# Patient Record
Sex: Male | Born: 1940 | Race: White | Hispanic: No | Smoking: Former smoker
Health system: Southern US, Community
[De-identification: ages and names within clinical notes are randomized; demographics above are authoritative.]

## PROBLEM LIST (undated history)

## (undated) DIAGNOSIS — F0390 Unspecified dementia without behavioral disturbance: Secondary | ICD-10-CM

## (undated) DIAGNOSIS — E119 Type 2 diabetes mellitus without complications: Secondary | ICD-10-CM

## (undated) DIAGNOSIS — I519 Heart disease, unspecified: Secondary | ICD-10-CM

## (undated) DIAGNOSIS — E785 Hyperlipidemia, unspecified: Secondary | ICD-10-CM

## (undated) DIAGNOSIS — I959 Hypotension, unspecified: Secondary | ICD-10-CM

## (undated) HISTORY — DX: Type 2 diabetes mellitus without complications: E11.9

## (undated) HISTORY — DX: Heart disease, unspecified: I51.9

## (undated) HISTORY — DX: Hypotension, unspecified: I95.9

## (undated) HISTORY — DX: Hyperlipidemia, unspecified: E78.5

## (undated) HISTORY — DX: Unspecified dementia, unspecified severity, without behavioral disturbance, psychotic disturbance, mood disturbance, and anxiety: F03.90

## (undated) HISTORY — PX: TONSILLECTOMY AND ADENOIDECTOMY: SHX28

---

## 1959-07-04 HISTORY — PX: APPENDECTOMY: SHX54

## 2008-07-31 LAB — HM COLONOSCOPY

## 2010-07-03 HISTORY — PX: TOTAL KNEE ARTHROPLASTY: SHX125

## 2014-07-03 HISTORY — PX: CORONARY ANGIOPLASTY WITH STENT PLACEMENT: SHX49

## 2016-03-28 ENCOUNTER — Encounter: Payer: Self-pay | Admitting: *Deleted

## 2016-04-05 ENCOUNTER — Non-Acute Institutional Stay: Payer: PPO | Admitting: Internal Medicine

## 2016-04-05 ENCOUNTER — Encounter: Payer: Self-pay | Admitting: Internal Medicine

## 2016-04-05 VITALS — BP 100/60 | HR 68 | Temp 98.7°F | Ht 64.5 in | Wt 163.0 lb

## 2016-04-05 DIAGNOSIS — M170 Bilateral primary osteoarthritis of knee: Secondary | ICD-10-CM | POA: Insufficient documentation

## 2016-04-05 DIAGNOSIS — F0391 Unspecified dementia with behavioral disturbance: Secondary | ICD-10-CM | POA: Diagnosis not present

## 2016-04-05 DIAGNOSIS — I2510 Atherosclerotic heart disease of native coronary artery without angina pectoris: Secondary | ICD-10-CM

## 2016-04-05 DIAGNOSIS — C4442 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 and neck: Secondary | ICD-10-CM | POA: Diagnosis not present

## 2016-04-05 DIAGNOSIS — N400 Enlarged prostate without lower urinary tract symptoms: Secondary | ICD-10-CM | POA: Diagnosis not present

## 2016-04-05 DIAGNOSIS — E785 Hyperlipidemia, unspecified: Secondary | ICD-10-CM

## 2016-04-05 DIAGNOSIS — N3281 Overactive bladder: Secondary | ICD-10-CM | POA: Diagnosis not present

## 2016-04-05 DIAGNOSIS — I119 Hypertensive heart disease without heart failure: Secondary | ICD-10-CM | POA: Diagnosis not present

## 2016-04-05 DIAGNOSIS — E1169 Type 2 diabetes mellitus with other specified complication: Secondary | ICD-10-CM | POA: Diagnosis not present

## 2016-04-05 DIAGNOSIS — I959 Hypotension, unspecified: Secondary | ICD-10-CM

## 2016-04-05 DIAGNOSIS — E119 Type 2 diabetes mellitus without complications: Secondary | ICD-10-CM

## 2016-04-05 DIAGNOSIS — H3322 Serous retinal detachment, left eye: Secondary | ICD-10-CM | POA: Diagnosis not present

## 2016-04-05 DIAGNOSIS — F03918 Unspecified dementia, unspecified severity, with other behavioral disturbance: Secondary | ICD-10-CM | POA: Insufficient documentation

## 2016-04-05 HISTORY — DX: Type 2 diabetes mellitus with other specified complication: E11.69

## 2016-04-05 HISTORY — DX: Hyperlipidemia, unspecified: E78.5

## 2016-04-05 HISTORY — DX: Benign prostatic hyperplasia without lower urinary tract symptoms: N40.0

## 2016-04-05 NOTE — Progress Notes (Signed)
PLEASE NOTE THAT PT'S NAME IS SPELLED WRONG--IT IS Gimbel NOT DROMETOR  Provider:  Da Authement L. Mariea Clonts, D.O., C.M.D. Location:  Occupational psychologist of Service:  Clinic (12)  Previous PCP: in [place] (records to be scanned)  Extended Emergency Contact Information Primary Emergency Contact: [employer] Address: [address]          [place], [place] [address] [name] of [place] Phone: [phone] Relation: Daughter  Code Status: DNR Goals of Care: Advanced Directive information Advanced Directives 04/05/2016  Does patient have an advance directive? Yes  Copy of advanced directive(s) in chart? No - copy requested    Chief Complaint  Patient presents with  . Establish Care    New Patient    HPI: Patient is a 75 y.o. male seen today to establish with [facility].  Records have been requested from his PCP.  No bloodwork recently.   Hyperlipidemia and DMII were well controlled at last eval.    Has had low bp--can only take 1/2 of a lisinopril.  CAD:  Had 2 stents placed in 2015.  Drug-eluting stents.  He had an episode of tightness in his chest and pain at that time.  He did very well.  Does still get a little tight once in a while, but has not needed the nitroglycerin.    In 2010, had loose, shattered retina.  Had a surgical repair.  Unclear which eye.  His wife says left.  Dr. Lowella Bandy was seeing him every 6 mos.    Had OA of right knee--has TKA in 2012.  Left not painful.    PSA tests have been normal.  He's had leakage of urine.  He takes flomax and myrbetriq.  Dr. Meredith Pel.  Skin problems on his head.  [place].  Had blue light treatment on his head and does wear a hat regularly now.    Vision is poor since the retina problem. Is HOH.    Dementia:  Wakes up and does not recall how he woke up.  He wandered around the house in the dark inside.  He has difficulty with the electronic locks--gets locked out.    On aricept and namenda.  No hallucinations and delusions.  Worries he will get lost.  Does have word-finding difficulties.  Freda Munro helps him with his medications--she does the pillbox weekly.  One box for am and one for pm and she helps him select the correct box.  His wife guides him through the shower.  Coming here was difficult.  After 3-4 weeks, they have a routine down.  She's there for all of his routine.  She selects his clothes and underwear. Their daughter is here and she is helping.  His wife would like to do some things like play bridge.  She has not hired someone just yet.    He has only fallen once.  Fell backwards b/c he had nothing in front of him.  He broke the porcelain tank of the toilet (missed toilet as sitting) and fell between toilet and shower.    Does have urinary incontinence--will just come w/o any control.  Discussed scheduled toileting.    Does have tremor of his hands.  Has difficulty getting into the bed and under the covers.  Takes 15-20 mins to get comfortable.  Not having pain.    Says he has some depression related to lack of ability to move like he wants  to.  Has difficulty getting his body to do what he wants it to do.  He has to think about it to put on his pants.  Has to concentrate very hard to get in his pants.    He needs a urology appt also.    He needs hobbies.  Can't concentrate on anything now.  Was workaholic and family, nothing else.  Past Medical History:  Diagnosis Date  . Dementia   . Diabetes ([place])   . Hyperlipidemia   . Hypotension    Past Surgical History:  Procedure Laterality Date  . APPENDECTOMY  1961  . TONSILLECTOMY AND ADENOIDECTOMY    . TOTAL KNEE ARTHROPLASTY  2012    Social History   Social History  . Marital status: Married    Spouse name: N/A  . Number of children: N/A  . Years of education: N/A   Social History Main Topics  . Smoking status: Former Smoker    Packs/day: 0.50    Years: 20.00    Types: Cigarettes    . Smokeless tobacco: Never Used  . Alcohol use No  . Drug use: No  . Sexual activity: No   Other Topics Concern  . None   Social History Narrative   Diet?       Do you drink/eat things with caffeine? no      Marital status?           married                         What year were you married?  1966      Do you live in a house, apartment, assisted living, condo, trailer, etc.? house      Is it one or more stories? one      How many persons live in your home?      Do you have any pets in your home? (please list) none      Current or past profession: financial      Do you exercise?   yes                                   Type & how often? 1/day      Do you have a living will? yes      Do you have a DNR form?                                  If not, do you want to discuss one?      Do you have signed POA/HPOA for forms?        reports that he has quit smoking. His smoking use included Cigarettes. He has a 10.00 pack-year smoking history. He has never used smokeless tobacco. He reports that he does not drink alcohol or use drugs.  Functional Status Survey:  SEE HPI  Family History  Problem Relation Age of Onset  . Cancer Mother 63  . Heart attack Father 54  . Diabetes Sister   . Brain cancer Sister     Health Maintenance  Topic Date Due  . HEMOGLOBIN A1C  01[birth year]02-40  . FOOT EXAM  [birth year]/1950  . OPHTHALMOLOGY EXAM  [birth year]/1950  . COLONOSCOPY  07/31/2013  . PNA vac Low Risk Adult (2 of 2 - PCV13) 02/28/2014  . INFLUENZA  VACCINE  02/01/2016  . TETANUS/TDAP  03/22/2016  . ZOSTAVAX  Completed    Allergies  Allergen Reactions  . Lortab [Hydrocodone-Acetaminophen]   . Morphine And Related       Medication List       Accurate as of 04/05/16 11:59 PM. Always use your most recent med list.          acetaminophen 650 MG CR tablet Commonly known as:  TYLENOL Take 650 mg by mouth daily.   allopurinol 100 MG tablet Commonly known as:  ZYLOPRIM Take 100 mg  by mouth daily.   aspirin EC 81 MG tablet Take 81 mg by mouth daily.   atorvastatin 80 MG tablet Commonly known as:  LIPITOR Take 80 mg by mouth daily.   clopidogrel 75 MG tablet Commonly known as:  PLAVIX Take 75 mg by mouth daily.   donepezil 10 MG tablet Commonly known as:  ARICEPT Take 10 mg by mouth at bedtime.   lisinopril 5 MG tablet Commonly known as:  PRINIVIL,ZESTRIL Take 5 mg by mouth daily.   memantine 10 MG tablet Commonly known as:  NAMENDA Take 10 mg by mouth 2 (two) times daily.   metFORMIN 500 MG tablet Commonly known as:  GLUCOPHAGE Take by mouth 2 (two) times daily with a meal.   MYRBETRIQ 50 MG Tb24 tablet Generic drug:  mirabegron ER Take 50 mg by mouth daily.   nitroGLYCERIN 0.4 MG SL tablet Commonly known as:  NITROSTAT Place 0.4 mg under the tongue every 5 (five) minutes as needed for chest pain.   tamsulosin 0.4 MG Caps capsule Commonly known as:  FLOMAX Take 0.4 mg by mouth daily.   vitamin B-12 1000 MCG tablet Commonly known as:  CYANOCOBALAMIN Take 1,000 mcg by mouth daily.       Review of Systems  Constitutional: Negative for chills, fever and malaise/fatigue.  HENT: Positive for hearing loss. Negative for congestion.   Eyes: Negative for blurred vision.       Glasses; left partial retinal detachment it sounds like  Respiratory: Negative for cough and shortness of breath.   Cardiovascular: Negative for chest pain, palpitations and leg swelling.  Gastrointestinal: Negative for abdominal pain, blood in stool, constipation, diarrhea, heartburn, melena, nausea and vomiting.  Genitourinary: Positive for frequency and urgency. Negative for dysuria.       Urinary incontinence  Musculoskeletal: Positive for falls.  Skin: Negative for itching and rash.  Neurological: Positive for tremors. Negative for dizziness, tingling, sensory change, loss of consciousness and weakness.  Psychiatric/Behavioral: Positive for memory loss. Negative for  depression. The patient is nervous/anxious. The patient does not have insomnia.        Fidgety, cannot sit still, wants to get up and walk around    Vitals:   04/05/16 1017  BP: 100/60  Pulse: 68  Temp: 98.7 F (37.1 C)  TempSrc: Oral  SpO2: 98%  Weight: 163 lb (73.9 kg)  Height: 5' 4.5" (1.638 m)   Body mass index is 27.55 kg/m. Physical Exam  Constitutional: He appears well-developed and well-nourished. No distress.  HENT:  Head: Normocephalic.  Right Ear: External ear normal.  Left Ear: External ear normal.  Nose: Nose normal.  Mouth/Throat: Oropharynx is clear and moist. No oropharyngeal exudate.  Eyes: Conjunctivae and EOM are normal. Pupils are equal, round, and reactive to light.  glasses  Neck: Normal range of motion. Neck supple.  Cardiovascular: Normal rate, regular rhythm, normal heart sounds and intact distal pulses.   Pulmonary/Chest:  Effort normal and breath sounds normal. No respiratory distress.  Abdominal: Soft. Bowel sounds are normal.  Musculoskeletal: Normal range of motion. He exhibits no edema or tenderness.  Neurological: He is alert. No cranial nerve deficit.  Oriented to person and place, not time, knows he is at [place], but keeps forgetting where at [place] during appt  Skin: Skin is warm and dry. Capillary refill takes less than 2 seconds.  Psychiatric:  Somewhat flat affect, does attempt to answer questions, but often does not remember and looks to his wife, Freda Munro, for help    Labs reviewed: Latest labs to be scanned with records from [place] and Procedures noted on new patient packet: 2010 cscope 2015 cath with 2 DES placed   Assessment/Plan 1. Dementia with behavioral disturbance, unspecified dementia type - biggest active problem now due to major adjustment to new home - his wife is working to get some assistance in the home with getting him ready in the mornings and possibly just to stay with him so she can do some  errands - unclear how much his incontinence is functional vs. bph and oab related - cont namenda and aricept--does not have difficulty taking his pills, but his wife helps him  2. Diabetes mellitus type 2 without retinopathy ([place]) - has been well controlled, continue asa 81, statin, ace and monitor especially as he gets adjusted here (pts tend to gain weight when they move to [facility])  3. Hyperlipidemia associated with type 2 diabetes mellitus ([place]) -cont statin therapy and monitor FLP  4. BPH without obstruction/lower urinary tract symptoms - cont flomax - still with severe urinary frequency and incontinence, needs urology f/u - Ambulatory referral to Urology  5. Bilateral primary osteoarthritis of knee -s/p single TKA, other leg less bothersome, has prn tylenol if needed  6. Hypotension, unspecified hypotension type - low bp has historically only tolerated the low dose ace - Ambulatory referral to Cardiology  7. Coronary artery disease involving native coronary artery of native heart without angina pectoris -s/p 2 DES -I don't have full details on this yet -need more records -pt on asa and plavix, asymptomatic recently - Ambulatory referral to Cardiology  8. Partial retinal detachment of left eye -poor vision in left eye -will need ophtho f/u also -continues to wear glasses, as well  9. Benign hypertensive heart disease without heart failure -bp runs low so only on ace low dose (also has flomax which can affect bp)  10. OAB (overactive bladder) -cont myrbetriq -needs urology f/u  11. 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 Lakeva Hollon, D.O. [facility] [address], [place] [address] Cell Phone (Mon-Fri 8am-5pm):  [phone] On Call:  [phone] & follow prompts after 5pm & weekends Office Phone:  [phone] Office Fax:  [phone]

## 2016-04-10 DIAGNOSIS — I25118 Atherosclerotic heart disease of native coronary artery with other forms of angina pectoris: Secondary | ICD-10-CM | POA: Insufficient documentation

## 2016-04-10 DIAGNOSIS — C4442 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 and neck: Secondary | ICD-10-CM

## 2016-04-10 DIAGNOSIS — H3322 Serous retinal detachment, left eye: Secondary | ICD-10-CM

## 2016-04-10 DIAGNOSIS — I119 Hypertensive heart disease without heart failure: Secondary | ICD-10-CM | POA: Insufficient documentation

## 2016-04-10 DIAGNOSIS — N3281 Overactive bladder: Secondary | ICD-10-CM | POA: Insufficient documentation

## 2016-04-10 HISTORY — DX: Atherosclerotic heart disease of native coronary artery with other forms of angina pectoris: I25.118

## 2016-04-10 HISTORY — DX: Hypertensive heart disease without heart failure: I11.9

## 2016-04-10 HISTORY — DX: 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 and neck: C44.42

## 2016-04-10 HISTORY — DX: Serous retinal detachment, left eye: H33.22

## 2016-04-28 ENCOUNTER — Other Ambulatory Visit: Payer: Self-pay | Admitting: Internal Medicine

## 2016-05-03 ENCOUNTER — Encounter: Payer: Self-pay | Admitting: Internal Medicine

## 2016-05-04 ENCOUNTER — Encounter: Payer: Self-pay | Admitting: Internal Medicine

## 2016-05-24 ENCOUNTER — Encounter (INDEPENDENT_AMBULATORY_CARE_PROVIDER_SITE_OTHER): Payer: Self-pay

## 2016-05-24 ENCOUNTER — Encounter: Payer: Self-pay | Admitting: Cardiovascular Disease

## 2016-05-24 ENCOUNTER — Ambulatory Visit (INDEPENDENT_AMBULATORY_CARE_PROVIDER_SITE_OTHER): Payer: PPO | Admitting: Cardiovascular Disease

## 2016-05-24 VITALS — BP 100/60 | HR 64 | Ht 65.0 in | Wt 160.4 lb

## 2016-05-24 DIAGNOSIS — Z7689 Persons encountering health services in other specified circumstances: Secondary | ICD-10-CM

## 2016-05-24 NOTE — Patient Instructions (Signed)
Medication Instructions:  Your physician recommends that you continue on your current medications as directed. Please refer to the Current Medication list given to you today.  Labwork: None  Testing/Procedures: None  Follow-Up: Your physician wants you to follow-up in: 1 year with Dr. Nishan.  You will receive a reminder letter in the mail two months in advance. If you don't receive a letter, please call our office to schedule the follow-up appointment.   Any Other Special Instructions Will Be Listed Below (If Applicable).     If you need a refill on your cardiac medications before your next appointment, please call your pharmacy.   

## 2016-05-24 NOTE — Progress Notes (Addendum)
Cardiology Office Note   Date:  05/24/2016   ID:  Jose Castillo, DOB 07/11/1940, MRN [id number]  PCP:  Hollace Kinnier, DO  Cardiologist:   Jenkins Rouge, MD   Chief Complaint  Patient presents with  . Establish Care      History of Present Illness: Jose Castillo is a 75 y.o. male who presents for evaluation of CAD. Notes from [facility] indicate that patient has had 2 stents in 2015 Occasional chest Pain not requiring nitro. He has dementia and is a DNR.  Falls and has urinary incontinence On Rx for HTN , cholesterol and DM.    He has advanced dementia and is not a candidate for aggressive cardiac testing Namenda added recently and no help. Move to [place] has thrown him off his Routine and really confused him  Wife of 44 years indicates he has not had any chest pain   Stents done at [facility] in [place] at that time he had chest pain and dyspnea Dementia was not bad then   Addendum:  06/12/16 records from [place] reviewed  Cath Mid LAD DES 2.5 x 18 mm Resolute post dilated 3.0 mm noncompliant balloon DES to OM3 2.25 x 18 mm Resolute stent  Presented with angina no MI   Past Medical History:  Diagnosis Date  . Dementia   . Diabetes ([place])   . Hyperlipidemia   . Hypotension     Past Surgical History:  Procedure Laterality Date  . APPENDECTOMY  1961  . TONSILLECTOMY AND ADENOIDECTOMY    . TOTAL KNEE ARTHROPLASTY  2012     Current Outpatient Prescriptions  Medication Sig Dispense Refill  . acetaminophen (TYLENOL) 650 MG CR tablet Take 650 mg by mouth daily.    Marland Kitchen allopurinol (ZYLOPRIM) 100 MG tablet Take 100 mg by mouth daily.    Marland Kitchen aspirin EC 81 MG tablet Take 81 mg by mouth daily.    Marland Kitchen atorvastatin (LIPITOR) 80 MG tablet Take 80 mg by mouth daily.    . clopidogrel (PLAVIX) 75 MG tablet Take 75 mg by mouth daily.    Marland Kitchen donepezil (ARICEPT) 10 MG tablet Take 10 mg by mouth at bedtime.    Marland Kitchen lisinopril (PRINIVIL,ZESTRIL) 5 MG tablet Take 5  mg by mouth daily.    . memantine (NAMENDA) 10 MG tablet Take 10 mg by mouth 2 (two) times daily.    . metFORMIN (GLUCOPHAGE) 500 MG tablet Take by mouth 2 (two) times daily with a meal.    . MYRBETRIQ 50 MG TB24 tablet TAKE 1 TABLET BY MOUTH EVERY DAY. MAXIMUM DAILY DOSE IS 1 30 tablet 0  . nitroGLYCERIN (NITROSTAT) 0.4 MG SL tablet Place 0.4 mg under the tongue every 5 (five) minutes as needed for chest pain.    . tamsulosin (FLOMAX) 0.4 MG CAPS capsule Take 0.4 mg by mouth daily.    . vitamin B-12 (CYANOCOBALAMIN) 1000 MCG tablet Take 1,000 mcg by mouth daily.     No current facility-administered medications for this visit.     Allergies:   Lortab [hydrocodone-acetaminophen] and Morphine and related    Social History:  The patient  reports that he has quit smoking. His smoking use included Cigarettes. He has a 10.00 pack-year smoking history. He has never used smokeless tobacco. He reports that he does not drink alcohol or use drugs.   Family History:  The patient's family history includes Brain cancer in his sister; Cancer (age of onset: 71) in his mother; Diabetes  in his sister; Heart attack (age of onset: 35) in his father.    ROS:  Please see the history of present illness.   Otherwise, review of systems are positive for none.   All other systems are reviewed and negative.    PHYSICAL EXAM: VS:  BP 100/60   Pulse 64   Ht 5\' 5"  (1.651 m)   Wt 72.8 kg (160 lb 6.4 oz)   SpO2 90%   BMI 26.69 kg/m  , BMI Body mass index is 26.69 kg/m. Affect appropriate Healthy:  appears stated age 48: normal Neck supple with no adenopathy JVP normal no bruits no thyromegaly Lungs clear with no wheezing and good diaphragmatic motion Heart:  S1/S2 no murmur, no rub, gallop or click PMI normal Abdomen: benighn, BS positve, no tenderness, no AAA no bruit.  No HSM or HJR Distal pulses intact with no bruits No edema Neuro non-focal Skin warm and dry No muscular weakness    EKG:  NSR  nonspecific ST changes low voltage  05/24/16     Recent Labs: No results found for requested labs within last 8760 hours.    Lipid Panel No results found for: CHOL, TRIG, HDL, CHOLHDL, VLDL, LDLCALC, LDLDIRECT    Wt Readings from Last 3 Encounters:  05/24/16 72.8 kg (160 lb 6.4 oz)  04/05/16 73.9 kg (163 lb)      Other studies Reviewed: Additional studies/ records that were reviewed today include: Notes [employer].    ASSESSMENT AND PLAN:  1.  CAD: stable no again continue current meds including ASA/Plavix Wife has nitro  At [facility] and has not needed to use it in 2 years will get records from [facility] 2. Demential advanced on aricept and namenda.  3. DM:  Discussed low carb diet.  Target hemoglobin A1c is 6.5 or less.  Continue current medications. 4. Chol  On statin continue current meds    Current medicines are reviewed at length with the patient today.  The patient does not have concerns regarding medicines.  The following changes have been made:  no change  Labs/ tests ordered today include: None   Orders Placed This Encounter  Procedures  . EKG 12-Lead     Disposition:   FU with in a year      Signed, Jenkins Rouge, MD  05/24/2016 3:30 PM    [address], [place], [place]  [address] Phone: [phone]; Fax: [phone]

## 2016-06-01 ENCOUNTER — Telehealth: Payer: Self-pay

## 2016-06-01 NOTE — Telephone Encounter (Signed)
PLACED NOTES IN DR NISHAN BOX.

## 2016-06-06 ENCOUNTER — Other Ambulatory Visit: Payer: Self-pay | Admitting: Internal Medicine

## 2016-06-16 ENCOUNTER — Other Ambulatory Visit: Payer: Self-pay

## 2016-06-16 MED ORDER — MIRABEGRON ER 50 MG PO TB24: ORAL_TABLET | ORAL | 2 refills | Status: DC

## 2016-06-16 MED ORDER — MEMANTINE HCL 10 MG PO TABS: 10.0000 mg | ORAL_TABLET | Freq: Two times a day (BID) | ORAL | 2 refills | Status: DC

## 2016-06-22 ENCOUNTER — Telehealth: Payer: Self-pay | Admitting: *Deleted

## 2016-06-22 NOTE — Telephone Encounter (Signed)
Received prior authorization for Myrbetriz from [employer] Boykin Reaper [id number] and spoke with [facility] and medication was APPROVED through 12/31/20017. Ref#: [id number].  [id number]

## 2016-07-05 ENCOUNTER — Non-Acute Institutional Stay: Payer: PPO | Admitting: Internal Medicine

## 2016-07-05 ENCOUNTER — Encounter: Payer: Self-pay | Admitting: Internal Medicine

## 2016-07-05 VITALS — BP 110/60 | HR 66 | Temp 98.7°F | Wt 160.0 lb

## 2016-07-05 DIAGNOSIS — F0391 Unspecified dementia with behavioral disturbance: Secondary | ICD-10-CM | POA: Diagnosis not present

## 2016-07-05 DIAGNOSIS — E1169 Type 2 diabetes mellitus with other specified complication: Secondary | ICD-10-CM

## 2016-07-05 DIAGNOSIS — E1142 Type 2 diabetes mellitus with diabetic polyneuropathy: Secondary | ICD-10-CM

## 2016-07-05 DIAGNOSIS — I25118 Atherosclerotic heart disease of native coronary artery with other forms of angina pectoris: Secondary | ICD-10-CM

## 2016-07-05 DIAGNOSIS — N400 Enlarged prostate without lower urinary tract symptoms: Secondary | ICD-10-CM | POA: Diagnosis not present

## 2016-07-05 DIAGNOSIS — N3281 Overactive bladder: Secondary | ICD-10-CM | POA: Diagnosis not present

## 2016-07-05 DIAGNOSIS — E785 Hyperlipidemia, unspecified: Secondary | ICD-10-CM

## 2016-07-05 NOTE — Progress Notes (Signed)
Location:  Occupational psychologist of Service:  Clinic (12)  Provider: Bethaney Oshana L. Mariea Clonts, D.O., C.M.D.  Code Status: DNR Goals of Care:  Advanced Directives 04/05/2016  Does Patient Have a Medical Advance Directive? Yes  Copy of [place] in Chart? No - copy requested     Chief Complaint  Patient presents with  . Acute Visit    fill out form    HPI: Patient is a 76 y.o. male seen today for completion of a form for him to attend the adult day health center.  He has dementia, moderate.  He requires his wife's help with all adls except to feed himself. He was meant to be seen this afternoon for his annual visit, but his wife mixed up their appts so he came this am.  She will be rescheduled and his wellness will be rescheduled.  He had a team where he went to a treatment center in [place].  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  They are interested in a new neurologist's second opinion on that.                                                                                                                                                                                                                                  Past Medical History:  Diagnosis Date  . Dementia   . Diabetes ([place])   . Hyperlipidemia   . Hypotension     Past Surgical History:  Procedure Laterality Date  . APPENDECTOMY  1961  . TONSILLECTOMY AND ADENOIDECTOMY    . TOTAL KNEE ARTHROPLASTY  2012    Allergies  Allergen Reactions  . Lortab [Hydrocodone-Acetaminophen]   . Morphine And Related     Allergies as of 07/05/2016      Reactions   Lortab [hydrocodone-acetaminophen]    Morphine And Related       Medication List       Accurate as of 07/05/16 12:03 PM. Always use your most recent med list.          acetaminophen 650 MG CR tablet Commonly known as:   TYLENOL Take 650 mg by mouth daily.   allopurinol 100 MG tablet Commonly known as:  ZYLOPRIM Take 100 mg by mouth daily.   aspirin EC 81 MG tablet Take 81 mg  by mouth daily.   atorvastatin 80 MG tablet Commonly known as:  LIPITOR Take 80 mg by mouth daily.   clopidogrel 75 MG tablet Commonly known as:  PLAVIX Take 75 mg by mouth daily.   donepezil 10 MG tablet Commonly known as:  ARICEPT Take 10 mg by mouth at bedtime.   lisinopril 5 MG tablet Commonly known as:  PRINIVIL,ZESTRIL Take 5 mg by mouth daily.   memantine 10 MG tablet Commonly known as:  NAMENDA Take 1 tablet (10 mg total) by mouth 2 (two) times daily.   metFORMIN 500 MG tablet Commonly known as:  GLUCOPHAGE Take by mouth 2 (two) times daily with a meal.   mirabegron ER 50 MG Tb24 tablet Commonly known as:  MYRBETRIQ TAKE 1 TABLET BY MOUTH EVERY DAY. MAXIMUM DAILY DOSE IS 1 TABLET   nitroGLYCERIN 0.4 MG SL tablet Commonly known as:  NITROSTAT Place 0.4 mg under the tongue every 5 (five) minutes as needed for chest pain.   tamsulosin 0.4 MG Caps capsule Commonly known as:  FLOMAX Take 0.4 mg by mouth daily.   vitamin B-12 1000 MCG tablet Commonly known as:  CYANOCOBALAMIN Take 1,000 mcg by mouth daily.       Review of Systems:  Review of Systems  Constitutional: Negative for chills and fever.  Eyes:       Glasses  Respiratory: Negative for shortness of breath.   Cardiovascular: Negative for chest pain and palpitations.  Gastrointestinal: Negative for abdominal pain and constipation.  Genitourinary: Positive for urgency. Negative for dysuria and frequency.  Musculoskeletal: Negative for falls.  Neurological: Positive for tingling and sensory change. Negative for weakness.       In feet  Endo/Heme/Allergies:       DMII  Psychiatric/Behavioral: Positive for memory loss. Negative for depression.    Health Maintenance  Topic Date Due  . HEMOGLOBIN A1C  03/24/1941  . FOOT EXAM  01/15/1950    . OPHTHALMOLOGY EXAM  01/15/1951  . COLONOSCOPY  07/31/2013  . PNA vac Low Risk Adult (2 of 2 - PCV13) 02/28/2014  . INFLUENZA VACCINE  02/01/2016  . TETANUS/TDAP  03/22/2016  . ZOSTAVAX  Completed    Physical Exam: Vitals:   07/05/16 1202  BP: 110/60  Pulse: 66  Temp: 98.7 F (37.1 C)  TempSrc: Oral  SpO2: 95%  Weight: 160 lb (72.6 kg)   Body mass index is 26.63 kg/m. Physical Exam  Constitutional: He appears well-developed and well-nourished. No distress.  Cardiovascular: Normal rate and regular rhythm.   Pulmonary/Chest: Effort normal.  Musculoskeletal: Normal range of motion.  Neurological: He is alert.  Oriented to person, place, not time  Skin: Skin is warm and dry.    Labs reviewed: Labs were abstracted from 10/06/14  Assessment/Plan 1. 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 when diagnosed in [place] -does not seem to have typical behavioral symptoms -is now starting to have hallucinations, it sounds like, and he is aware these are not real--says he will think that he is doing one thing, then suddenly come to and realize he's somewhere else and no one else supports that what he thought happened actually did -his daughter says this is new as far as she knows (his wife was not at appt today due to her own medical appt) -agree with [facility] referral, form completed today -his wife may still need some additional help with pt's morning routine eventually, but his daughter reports that pt is actually  doing better now and gotten into a routine  -MRI brain CD showed atrophy, but no report was included  2. Coronary artery disease of native artery of native heart with stable angina pectoris (HCC) -stable, asymptomatic with current regimen  3. Hyperlipidemia associated with type 2 diabetes mellitus ([place]) -f/u FLP as last labs in 2016, is on lipitor therapy  4. OAB (overactive bladder) -using 50mg   myrbetriq and flomax  5. BPH without obstruction/lower urinary tract symptoms -cont flomax  6. Diabetic peripheral neuropathy associated with type 2 diabetes mellitus ([place]) -seems neuropathy is also new -he seems to be mixed up gout and neuropathy thinking his allopurinol is to prevent the neuropathic pain he has in bilateral feet  Labs/tests ordered: cbc, cmp, flp, hba1c next week Next appt:  07/27/2016 for annual wellness--needs MMSE  Bernice Mullin L. Abshir Paolini, D.O. [facility] [address], [place] [address] Cell Phone (Mon-Fri 8am-5pm):  [phone] On Call:  [phone] & follow prompts after 5pm & weekends Office Phone:  [phone] Office Fax:  [phone]

## 2016-07-07 DIAGNOSIS — R351 Nocturia: Secondary | ICD-10-CM | POA: Diagnosis not present

## 2016-07-07 DIAGNOSIS — N3941 Urge incontinence: Secondary | ICD-10-CM | POA: Diagnosis not present

## 2016-07-07 DIAGNOSIS — N401 Enlarged prostate with lower urinary tract symptoms: Secondary | ICD-10-CM | POA: Diagnosis not present

## 2016-07-11 ENCOUNTER — Encounter: Payer: Self-pay | Admitting: Internal Medicine

## 2016-07-11 DIAGNOSIS — F0391 Unspecified dementia with behavioral disturbance: Secondary | ICD-10-CM | POA: Diagnosis not present

## 2016-07-11 DIAGNOSIS — I25118 Atherosclerotic heart disease of native coronary artery with other forms of angina pectoris: Secondary | ICD-10-CM | POA: Diagnosis not present

## 2016-07-11 DIAGNOSIS — E1143 Type 2 diabetes mellitus with diabetic autonomic (poly)neuropathy: Secondary | ICD-10-CM | POA: Diagnosis not present

## 2016-07-11 DIAGNOSIS — E1169 Type 2 diabetes mellitus with other specified complication: Secondary | ICD-10-CM | POA: Diagnosis not present

## 2016-07-11 LAB — HEMOGLOBIN A1C: HEMOGLOBIN A1C: 8.1

## 2016-07-11 LAB — BASIC METABOLIC PANEL
BUN: 51 mg/dL — AB (ref 4–21)
CREATININE: 1.4 mg/dL — AB (ref 0.6–1.3)
Glucose: 145 mg/dL
POTASSIUM: 5.1 mmol/L (ref 3.4–5.3)
Sodium: 140 mmol/L (ref 137–147)

## 2016-07-11 LAB — CBC AND DIFFERENTIAL
HCT: 37 % — AB (ref 41–53)
Hemoglobin: 12.2 g/dL — AB (ref 13.5–17.5)
PLATELETS: 197 10*3/uL (ref 150–399)
WBC: 7.5 10^3/mL

## 2016-07-11 LAB — LIPID PANEL
CHOLESTEROL: 149 mg/dL (ref 0–200)
HDL: 63 mg/dL (ref 35–70)
LDL Cholesterol: 63 mg/dL
TRIGLYCERIDES: 116 mg/dL (ref 40–160)

## 2016-07-11 LAB — HEPATIC FUNCTION PANEL
ALK PHOS: 117 U/L (ref 25–125)
ALT: 16 U/L (ref 10–40)
AST: 14 U/L (ref 14–40)
BILIRUBIN, TOTAL: 0.2 mg/dL

## 2016-07-26 ENCOUNTER — Other Ambulatory Visit: Payer: Self-pay | Admitting: *Deleted

## 2016-07-26 MED ORDER — DONEPEZIL HCL 10 MG PO TABS: 10.0000 mg | ORAL_TABLET | Freq: Every day | ORAL | 3 refills | Status: DC

## 2016-07-26 MED ORDER — LISINOPRIL 5 MG PO TABS: 5.0000 mg | ORAL_TABLET | Freq: Every day | ORAL | 3 refills | Status: DC

## 2016-07-26 MED ORDER — METFORMIN HCL 500 MG PO TABS: 500.0000 mg | ORAL_TABLET | Freq: Two times a day (BID) | ORAL | 3 refills | Status: DC

## 2016-07-26 MED ORDER — CLOPIDOGREL BISULFATE 75 MG PO TABS: 75.0000 mg | ORAL_TABLET | Freq: Every day | ORAL | 3 refills | Status: DC

## 2016-07-26 MED ORDER — ATORVASTATIN CALCIUM 80 MG PO TABS: 80.0000 mg | ORAL_TABLET | Freq: Every day | ORAL | 3 refills | Status: DC

## 2016-07-26 MED ORDER — VITAMIN B-12 1000 MCG PO TABS: 1000.0000 ug | ORAL_TABLET | Freq: Every day | ORAL | 3 refills | Status: DC

## 2016-07-26 MED ORDER — ALLOPURINOL 100 MG PO TABS: 100.0000 mg | ORAL_TABLET | Freq: Every day | ORAL | 3 refills | Status: DC

## 2016-07-26 MED ORDER — TAMSULOSIN HCL 0.4 MG PO CAPS: 0.4000 mg | ORAL_CAPSULE | Freq: Every day | ORAL | 3 refills | Status: DC

## 2016-07-26 NOTE — Telephone Encounter (Signed)
Received fax from [facility] stating that patient needs refills on everything except Namenda, Myrbetriq and Nitrostat. Faxed.

## 2016-07-27 ENCOUNTER — Telehealth: Payer: Self-pay

## 2016-07-27 ENCOUNTER — Emergency Department (HOSPITAL_COMMUNITY): Payer: PPO

## 2016-07-27 ENCOUNTER — Ambulatory Visit: Payer: PPO | Admitting: Internal Medicine

## 2016-07-27 ENCOUNTER — Encounter (HOSPITAL_COMMUNITY): Payer: Self-pay | Admitting: Emergency Medicine

## 2016-07-27 ENCOUNTER — Observation Stay (HOSPITAL_COMMUNITY)
Admission: EM | Admit: 2016-07-27 | Discharge: 2016-07-28 | Disposition: A | Discharge status: Skilled Nursing Facility | Payer: PPO | Attending: Internal Medicine | Admitting: Internal Medicine

## 2016-07-27 DIAGNOSIS — E1149 Type 2 diabetes mellitus with other diabetic neurological complication: Secondary | ICD-10-CM

## 2016-07-27 DIAGNOSIS — R079 Chest pain, unspecified: Secondary | ICD-10-CM | POA: Diagnosis not present

## 2016-07-27 DIAGNOSIS — N3289 Other specified disorders of bladder: Secondary | ICD-10-CM | POA: Insufficient documentation

## 2016-07-27 DIAGNOSIS — Z96659 Presence of unspecified artificial knee joint: Secondary | ICD-10-CM | POA: Insufficient documentation

## 2016-07-27 DIAGNOSIS — J00 Acute nasopharyngitis [common cold]: Secondary | ICD-10-CM

## 2016-07-27 DIAGNOSIS — Z79899 Other long term (current) drug therapy: Secondary | ICD-10-CM | POA: Insufficient documentation

## 2016-07-27 DIAGNOSIS — Z7902 Long term (current) use of antithrombotics/antiplatelets: Secondary | ICD-10-CM | POA: Diagnosis not present

## 2016-07-27 DIAGNOSIS — N400 Enlarged prostate without lower urinary tract symptoms: Secondary | ICD-10-CM | POA: Diagnosis not present

## 2016-07-27 DIAGNOSIS — E118 Type 2 diabetes mellitus with unspecified complications: Secondary | ICD-10-CM | POA: Diagnosis not present

## 2016-07-27 DIAGNOSIS — B9729 Other coronavirus as the cause of diseases classified elsewhere: Secondary | ICD-10-CM | POA: Insufficient documentation

## 2016-07-27 DIAGNOSIS — I2510 Atherosclerotic heart disease of native coronary artery without angina pectoris: Secondary | ICD-10-CM | POA: Insufficient documentation

## 2016-07-27 DIAGNOSIS — F0391 Unspecified dementia with behavioral disturbance: Secondary | ICD-10-CM | POA: Diagnosis not present

## 2016-07-27 DIAGNOSIS — B342 Coronavirus infection, unspecified: Secondary | ICD-10-CM | POA: Diagnosis present

## 2016-07-27 DIAGNOSIS — Z87891 Personal history of nicotine dependence: Secondary | ICD-10-CM | POA: Diagnosis not present

## 2016-07-27 DIAGNOSIS — E1142 Type 2 diabetes mellitus with diabetic polyneuropathy: Secondary | ICD-10-CM | POA: Diagnosis not present

## 2016-07-27 DIAGNOSIS — J069 Acute upper respiratory infection, unspecified: Principal | ICD-10-CM | POA: Diagnosis present

## 2016-07-27 DIAGNOSIS — F03918 Unspecified dementia, unspecified severity, with other behavioral disturbance: Secondary | ICD-10-CM | POA: Diagnosis present

## 2016-07-27 DIAGNOSIS — Z7984 Long term (current) use of oral hypoglycemic drugs: Secondary | ICD-10-CM | POA: Diagnosis not present

## 2016-07-27 DIAGNOSIS — E785 Hyperlipidemia, unspecified: Secondary | ICD-10-CM | POA: Diagnosis not present

## 2016-07-27 DIAGNOSIS — Z955 Presence of coronary angioplasty implant and graft: Secondary | ICD-10-CM | POA: Diagnosis not present

## 2016-07-27 DIAGNOSIS — M1A9XX0 Chronic gout, unspecified, without tophus (tophi): Secondary | ICD-10-CM | POA: Insufficient documentation

## 2016-07-27 DIAGNOSIS — Z7982 Long term (current) use of aspirin: Secondary | ICD-10-CM | POA: Insufficient documentation

## 2016-07-27 DIAGNOSIS — I25118 Atherosclerotic heart disease of native coronary artery with other forms of angina pectoris: Secondary | ICD-10-CM | POA: Diagnosis present

## 2016-07-27 DIAGNOSIS — I10 Essential (primary) hypertension: Secondary | ICD-10-CM | POA: Diagnosis not present

## 2016-07-27 HISTORY — DX: Type 2 diabetes mellitus with other diabetic neurological complication: E11.49

## 2016-07-27 LAB — CBC
HCT: 32.3 % — ABNORMAL LOW (ref 39.0–52.0)
Hemoglobin: 10.7 g/dL — ABNORMAL LOW (ref 13.0–17.0)
MCH: 30.9 pg (ref 26.0–34.0)
MCHC: 33.1 g/dL (ref 30.0–36.0)
MCV: 93.4 fL (ref 78.0–100.0)
Platelets: 186 10*3/uL (ref 150–400)
RBC: 3.46 MIL/uL — ABNORMAL LOW (ref 4.22–5.81)
RDW: 14.6 % (ref 11.5–15.5)
WBC: 11.1 10*3/uL — ABNORMAL HIGH (ref 4.0–10.5)

## 2016-07-27 LAB — RESPIRATORY PANEL BY PCR
ADENOVIRUS-RVPPCR: NOT DETECTED
BORDETELLA PERTUSSIS-RVPCR: NOT DETECTED
CORONAVIRUS 229E-RVPPCR: NOT DETECTED
CORONAVIRUS HKU1-RVPPCR: DETECTED — AB
CORONAVIRUS NL63-RVPPCR: NOT DETECTED
Chlamydophila pneumoniae: NOT DETECTED
Coronavirus OC43: NOT DETECTED
Influenza A: NOT DETECTED
Influenza B: NOT DETECTED
METAPNEUMOVIRUS-RVPPCR: NOT DETECTED
Mycoplasma pneumoniae: NOT DETECTED
PARAINFLUENZA VIRUS 1-RVPPCR: NOT DETECTED
PARAINFLUENZA VIRUS 2-RVPPCR: NOT DETECTED
Parainfluenza Virus 3: NOT DETECTED
Parainfluenza Virus 4: NOT DETECTED
Respiratory Syncytial Virus: NOT DETECTED
Rhinovirus / Enterovirus: NOT DETECTED

## 2016-07-27 LAB — BASIC METABOLIC PANEL
Anion gap: 9 (ref 5–15)
BUN: 25 mg/dL — ABNORMAL HIGH (ref 6–20)
CO2: 26 mmol/L (ref 22–32)
Calcium: 9.5 mg/dL (ref 8.9–10.3)
Chloride: 102 mmol/L (ref 101–111)
Creatinine, Ser: 1.09 mg/dL (ref 0.61–1.24)
GFR calc Af Amer: >60 mL/min (ref >60)
GFR calc non Af Amer: >60 mL/min (ref >60)
Glucose, Bld: 160 mg/dL — ABNORMAL HIGH (ref 65–99)
Potassium: 5.1 mmol/L (ref 3.5–5.1)
Sodium: 137 mmol/L (ref 135–145)

## 2016-07-27 LAB — URINALYSIS, ROUTINE W REFLEX MICROSCOPIC
Bilirubin Urine: NEGATIVE
Glucose, UA: NEGATIVE mg/dL
Ketones, ur: NEGATIVE mg/dL
Leukocytes, UA: NEGATIVE
Nitrite: NEGATIVE
PH: 7 (ref 5.0–8.0)
PROTEIN: NEGATIVE mg/dL
SQUAMOUS EPITHELIAL / LPF: NONE SEEN
Specific Gravity, Urine: 1.015 (ref 1.005–1.030)

## 2016-07-27 LAB — MRSA PCR SCREENING: MRSA by PCR: NEGATIVE

## 2016-07-27 LAB — TROPONIN I: Troponin I: <0.03 ng/mL (ref <0.03)

## 2016-07-27 LAB — I-STAT TROPONIN, ED: Troponin i, poc: 0 ng/mL (ref 0.00–0.08)

## 2016-07-27 LAB — GLUCOSE, CAPILLARY
GLUCOSE-CAPILLARY: 121 mg/dL — AB (ref 65–99)
Glucose-Capillary: 243 mg/dL — ABNORMAL HIGH (ref 65–99)

## 2016-07-27 MED ORDER — INSULIN ASPART 100 UNIT/ML ~~LOC~~ SOLN
0.0000 [IU] | Freq: Three times a day (TID) | SUBCUTANEOUS | Status: DC
  Administered 2016-07-27: 1 [IU] via SUBCUTANEOUS
  Administered 2016-07-28: 2 [IU] via SUBCUTANEOUS

## 2016-07-27 MED ORDER — HALOPERIDOL LACTATE 5 MG/ML IJ SOLN: 2.0000 mg | Freq: Four times a day (QID) | INTRAMUSCULAR | Status: DC | PRN

## 2016-07-27 MED ORDER — SODIUM CHLORIDE 0.9% FLUSH
3.0000 mL | Freq: Two times a day (BID) | INTRAVENOUS | Status: DC
  Administered 2016-07-27: 3 mL via INTRAVENOUS

## 2016-07-27 MED ORDER — SODIUM CHLORIDE 0.9% FLUSH: 3.0000 mL | INTRAVENOUS | Status: DC | PRN

## 2016-07-27 MED ORDER — ACETAMINOPHEN 650 MG RE SUPP: 650.0000 mg | Freq: Four times a day (QID) | RECTAL | Status: DC | PRN

## 2016-07-27 MED ORDER — SODIUM CHLORIDE 0.9 % IV SOLN: 250.0000 mL | INTRAVENOUS | Status: DC | PRN

## 2016-07-27 MED ORDER — ASPIRIN EC 81 MG PO TBEC
81.0000 mg | DELAYED_RELEASE_TABLET | Freq: Every day | ORAL | Status: DC
  Administered 2016-07-28: 81 mg via ORAL
  Filled 2016-07-27: qty 1

## 2016-07-27 MED ORDER — SODIUM CHLORIDE 0.9% FLUSH
3.0000 mL | Freq: Two times a day (BID) | INTRAVENOUS | Status: DC
  Administered 2016-07-27 (×2): 3 mL via INTRAVENOUS

## 2016-07-27 MED ORDER — LORAZEPAM 2 MG/ML IJ SOLN
0.5000 mg | INTRAMUSCULAR | Status: DC | PRN
  Administered 2016-07-27: 1 mg via INTRAVENOUS
  Administered 2016-07-27: 0.5 mg via INTRAVENOUS
  Filled 2016-07-27 (×2): qty 1

## 2016-07-27 MED ORDER — DONEPEZIL HCL 10 MG PO TABS
10.0000 mg | ORAL_TABLET | Freq: Every day | ORAL | Status: DC
  Administered 2016-07-27: 10 mg via ORAL
  Filled 2016-07-27: qty 1

## 2016-07-27 MED ORDER — ATORVASTATIN CALCIUM 80 MG PO TABS
80.0000 mg | ORAL_TABLET | Freq: Every day | ORAL | Status: DC
  Administered 2016-07-28: 80 mg via ORAL
  Filled 2016-07-27: qty 1

## 2016-07-27 MED ORDER — GUAIFENESIN ER 600 MG PO TB12
1200.0000 mg | ORAL_TABLET | Freq: Two times a day (BID) | ORAL | Status: DC
  Administered 2016-07-27 – 2016-07-28 (×2): 1200 mg via ORAL
  Filled 2016-07-27 (×2): qty 2

## 2016-07-27 MED ORDER — IBUPROFEN 600 MG PO TABS: 600.0000 mg | ORAL_TABLET | Freq: Four times a day (QID) | ORAL | Status: DC | PRN

## 2016-07-27 MED ORDER — MEMANTINE HCL 10 MG PO TABS
10.0000 mg | ORAL_TABLET | Freq: Two times a day (BID) | ORAL | Status: DC
  Administered 2016-07-27 – 2016-07-28 (×2): 10 mg via ORAL
  Filled 2016-07-27 (×2): qty 1

## 2016-07-27 MED ORDER — CLOPIDOGREL BISULFATE 75 MG PO TABS
75.0000 mg | ORAL_TABLET | Freq: Every day | ORAL | Status: DC
  Administered 2016-07-28: 75 mg via ORAL
  Filled 2016-07-27: qty 1

## 2016-07-27 MED ORDER — ACETAMINOPHEN 325 MG PO TABS: 650.0000 mg | ORAL_TABLET | Freq: Four times a day (QID) | ORAL | Status: DC | PRN

## 2016-07-27 MED ORDER — ALFUZOSIN HCL ER 10 MG PO TB24
10.0000 mg | ORAL_TABLET | Freq: Every day | ORAL | Status: DC
  Administered 2016-07-27: 10 mg via ORAL
  Filled 2016-07-27: qty 1

## 2016-07-27 MED ORDER — MIRABEGRON ER 50 MG PO TB24
50.0000 mg | ORAL_TABLET | Freq: Every day | ORAL | Status: DC
Start: 2016-07-27 — End: 2016-07-28
  Administered 2016-07-27 – 2016-07-28 (×2): 50 mg via ORAL
  Filled 2016-07-27 (×3): qty 1

## 2016-07-27 MED ORDER — LISINOPRIL 5 MG PO TABS
5.0000 mg | ORAL_TABLET | Freq: Every day | ORAL | Status: DC
  Filled 2016-07-27: qty 1

## 2016-07-27 MED ORDER — GABAPENTIN 100 MG PO CAPS
200.0000 mg | ORAL_CAPSULE | Freq: Every day | ORAL | Status: DC
  Administered 2016-07-27: 200 mg via ORAL
  Filled 2016-07-27: qty 2

## 2016-07-27 MED ORDER — ALLOPURINOL 100 MG PO TABS
100.0000 mg | ORAL_TABLET | Freq: Every day | ORAL | Status: DC
  Administered 2016-07-28: 100 mg via ORAL
  Filled 2016-07-27: qty 1

## 2016-07-27 MED ORDER — INSULIN ASPART 100 UNIT/ML ~~LOC~~ SOLN
0.0000 [IU] | Freq: Every day | SUBCUTANEOUS | Status: DC
  Administered 2016-07-27: 2 [IU] via SUBCUTANEOUS

## 2016-07-27 NOTE — ED Notes (Signed)
Assisted pt to bedside commode, per family member. Pt unable to use the bathroom. Pt placed back in bed.

## 2016-07-27 NOTE — ED Notes (Signed)
Dr. Merrell at bedside. 

## 2016-07-27 NOTE — Progress Notes (Signed)
Patient arrived from ED on stretcher. Patient was alert but only oriented to self. This is consistent with patient history. Patient was a 2 person max assist from stretcher to scale. Patient had difficulty following instructions. Significant confusion noted.

## 2016-07-27 NOTE — Telephone Encounter (Signed)
Message left on clinical intake voicemail:   Margarita Grizzle with [facility] left message requesting refills on all medications. Patient needs refill to fill medication pill box. Patient has enough medication for 1 more day.  I returned call and left message informing Margarita Grizzle that patient in currently in the hospital and yesterday we received a request from [facility]. We refilled 7 medications for the patient as requested.   Margarita Grizzle was instructed to further follow-up with [facility]

## 2016-07-27 NOTE — H&P (Signed)
History and Physical    Jose Castillo [id number] DOB: January 25, 1941 DOA: 07/27/2016  PCP: Hollace Kinnier, DO Patient coming from: [facility]  Chief Complaint: CP  HPI: Jose Castillo is a 76 y.o. male with medical history significant of dementia, DM, HLD, Hypotension. Level 5 caveat applies given pts advanced dementia. History provided on a limited basis by patient, patient's daughter, nursing home staff and EDP. Per report patient developed sharp nonradiating substernal chest pain on the morning of admission. This complaint was elicited by patient's daughter who then informed nursing home staff. Patient was given aspirin 324 mg w/ relief of CP. Exertional. Denies any associated symptoms such as shortness of breath, palpitations, nausea, vomiting, diaphoresis, radiation to jaw or shoulder. Pts only other complaint at this time is 2-3 day h/o increasing head congestion, mild body aches.   ED Course: objective findings outlined below. No therapies given  Review of Systems: As per HPI otherwise 10 point review of systems negative.   Ambulatory Status: unclear  Past Medical History:  Diagnosis Date  . Dementia   . Diabetes ([place])   . Hyperlipidemia   . Hypotension     Past Surgical History:  Procedure Laterality Date  . APPENDECTOMY  1961  . TONSILLECTOMY AND ADENOIDECTOMY    . TOTAL KNEE ARTHROPLASTY  2012    Social History   Social History  . Marital status: Married    Spouse name: N/A  . Number of children: N/A  . Years of education: N/A   Occupational History  . Not on file.   Social History Main Topics  . Smoking status: Former Smoker    Packs/day: 0.50    Years: 20.00    Types: Cigarettes  . Smokeless tobacco: Never Used  . Alcohol use No  . Drug use: No  . Sexual activity: No   Other Topics Concern  . Not on file   Social History Narrative   Diet?       Do you drink/eat things with caffeine? no      Marital status?           married                          What year were you married?  1966      Do you live in a house, apartment, assisted living, condo, trailer, etc.? house      Is it one or more stories? one      How many persons live in your home?      Do you have any pets in your home? (please list) none      Current or past profession: financial      Do you exercise?   yes                                   Type & how often? 1/day      Do you have a living will? yes      Do you have a DNR form?                                  If not, do you want to discuss one?      Do you have signed POA/HPOA for forms?        Allergies  Allergen Reactions  .  Lortab [Hydrocodone-Acetaminophen] Other (See Comments)    Reaction:  Affected pts cognition   . Morphine And Related Other (See Comments)    Reaction:  Affected pts cognition     Family History  Problem Relation Age of Onset  . Cancer Mother 59  . Heart attack Father 38  . Diabetes Sister   . Brain cancer Sister     Prior to Admission medications   Medication Sig Start Date End Date Taking? Authorizing Provider  acetaminophen (TYLENOL) 650 MG CR tablet Take 650 mg by mouth daily.   Yes Historical Provider, MD  allopurinol (ZYLOPRIM) 100 MG tablet Take 1 tablet (100 mg total) by mouth daily. 07/26/16  Yes Tiffany L Reed, DO  aspirin EC 81 MG tablet Take 81 mg by mouth daily.   Yes Historical Provider, MD  atorvastatin (LIPITOR) 80 MG tablet Take 1 tablet (80 mg total) by mouth daily. 07/26/16  Yes Tiffany L Reed, DO  clopidogrel (PLAVIX) 75 MG tablet Take 1 tablet (75 mg total) by mouth daily. 07/26/16  Yes Tiffany L Reed, DO  donepezil (ARICEPT) 10 MG tablet Take 1 tablet (10 mg total) by mouth at bedtime. 07/26/16  Yes Tiffany L Reed, DO  gabapentin (NEURONTIN) 100 MG capsule Take 200 mg by mouth at bedtime.   Yes Historical Provider, MD  lisinopril (PRINIVIL,ZESTRIL) 5 MG tablet Take 1 tablet (5 mg total) by mouth daily. 07/26/16  Yes Tiffany L Reed, DO    memantine (NAMENDA) 10 MG tablet Take 1 tablet (10 mg total) by mouth 2 (two) times daily. 06/16/16  Yes Tiffany L Reed, DO  metFORMIN (GLUCOPHAGE) 500 MG tablet Take 1 tablet (500 mg total) by mouth 2 (two) times daily with a meal. 07/26/16  Yes Tiffany L Reed, DO  mirabegron ER (MYRBETRIQ) 50 MG TB24 tablet Take 50 mg by mouth daily.   Yes Historical Provider, MD  nitroGLYCERIN (NITROSTAT) 0.4 MG SL tablet Place 0.4 mg under the tongue every 5 (five) minutes as needed for chest pain.   Yes Historical Provider, MD  tamsulosin (FLOMAX) 0.4 MG CAPS capsule Take 0.4 mg by mouth at bedtime.   Yes Historical Provider, MD  vitamin B-12 (CYANOCOBALAMIN) 1000 MCG tablet Take 1 tablet (1,000 mcg total) by mouth daily. 07/26/16  Yes [name], DO    Physical Exam: Vitals:   07/27/16 1200 07/27/16 1219 07/27/16 1224 07/27/16 1330  BP: 90/74 90/69    Pulse:  89    Resp: 16 20  18   Temp:   100.2 F (37.9 C)   TempSrc:   Rectal   SpO2:  98%       General:  Appears calm and comfortable Eyes:  PERRL, EOMI, normal lids, iris ENT:  grossly normal hearing, lips & tongue, mmm Neck:  no LAD, masses or thyromegaly Cardiovascular:  RRR, soft II/VI systolic murmur. No LE edema.  Respiratory:  CTA bilaterally, no w/r/r. Normal respiratory effort. Abdomen:  soft, ntnd, NABS Skin:  no rash or induration seen on limited exam Musculoskeletal:  grossly normal tone BUE/BLE, good ROM, no bony abnormality Psychiatric: pleasant. Follows basic commands. AAOx1 Neurologic:  CN 2-12 grossly intact, moves all extremities in coordinated fashion, sensation intact  Labs on Admission: I have personally reviewed following labs and imaging studies  CBC:  Recent Labs Lab 07/27/16 1043  WBC 11.1*  HGB 10.7*  HCT 32.3*  MCV 93.4  PLT 99991111   Basic Metabolic Panel:  Recent Labs Lab 07/27/16 1043  NA 137  K 5.1  CL 102  CO2 26  GLUCOSE 160*  BUN 25*  CREATININE 1.09  CALCIUM 9.5   GFR: CrCl cannot be  calculated (Unknown ideal weight.). Liver Function Tests: No results for input(s): AST, ALT, ALKPHOS, BILITOT, PROT, ALBUMIN in the last 168 hours. No results for input(s): LIPASE, AMYLASE in the last 168 hours. No results for input(s): AMMONIA in the last 168 hours. Coagulation Profile: No results for input(s): INR, PROTIME in the last 168 hours. Cardiac Enzymes: No results for input(s): CKTOTAL, CKMB, CKMBINDEX, TROPONINI in the last 168 hours. BNP (last 3 results) No results for input(s): PROBNP in the last 8760 hours. HbA1C: No results for input(s): HGBA1C in the last 72 hours. CBG: No results for input(s): GLUCAP in the last 168 hours. Lipid Profile: No results for input(s): CHOL, HDL, LDLCALC, TRIG, CHOLHDL, LDLDIRECT in the last 72 hours. Thyroid Function Tests: No results for input(s): TSH, T4TOTAL, FREET4, T3FREE, THYROIDAB in the last 72 hours. Anemia Panel: No results for input(s): VITAMINB12, FOLATE, FERRITIN, TIBC, IRON, RETICCTPCT in the last 72 hours. Urine analysis:    Component Value Date/Time   COLORURINE YELLOW 07/27/2016 [place] 07/27/2016 1253   LABSPEC 1.015 07/27/2016 1253   PHURINE 7.0 07/27/2016 1253   GLUCOSEU NEGATIVE 07/27/2016 1253   HGBUR MODERATE (A) 07/27/2016 1253   BILIRUBINUR NEGATIVE 07/27/2016 1253   KETONESUR NEGATIVE 07/27/2016 1253   PROTEINUR NEGATIVE 07/27/2016 1253   NITRITE NEGATIVE 07/27/2016 1253   LEUKOCYTESUR NEGATIVE 07/27/2016 1253    Creatinine Clearance: CrCl cannot be calculated (Unknown ideal weight.).  Sepsis Labs: @LABRCNTIP (procalcitonin:4,lacticidven:4) )No results found for this or any previous visit (from the past 240 hour(s)).   Radiological Exams on Admission: Dg Chest 2 View  Result Date: 07/27/2016 CLINICAL DATA:  76 year old male with a history of chest pain EXAM: CHEST  2 VIEW COMPARISON:  None. FINDINGS: Apical lordotic positioning. Borderline enlarged heart.  Calcifications of the  aortic arch. No central vascular congestion. No pneumothorax or pleural effusion. No confluent airspace disease. Linear opacities at the base on lateral view may reflect atelectasis/ scarring. Nodule of the right upper lung measuring 8 mm-9 mm. No displaced fracture. IMPRESSION: No radiographic evidence of acute cardiopulmonary disease. Nodule of the right upper lung. Referral for pulmonary evaluation and chest CT is recommend, given the apparent lung nodule and that CT lung cancer screening is recommended for patients who are 76-72 years of age with a 30+ pack-year history of smoking, and who are currently smoking or quit <=15 years ago. Signed, Dulcy Fanny. Earleen Newport, DO [facility] Electronically Signed   By: Corrie Mckusick D.O.   On: 07/27/2016 11:26    EKG: Independently reviewed. Sinus. Non-specific changes from previous. No ACS  Assessment/Plan Active Problems:   Dementia with behavioral disturbance   Coronary artery disease involving native coronary artery of native heart   Chest pain   Diabetes mellitus with complication (HCC)   URI (upper respiratory infection)   Essential hypertension   CP: Cardiac vs anxiety/stress vs GI vs pulmonary. History significant for CAD status post cardiac catheterization in dual stent placement in 2016. Current episode did not last very long with substernal and exertional with relief after aspirin. Daughter states that pt has been under a lot of stress lately as his wife has been admitted to the hospital for several days.  - cycle trop - EKG in am - continue ASA, Plavix - Cards consult in am if needed  URI: 2-3 day h/o URI sx and mild body aches. CXR nml. Adequate PO. Temp in ED 100.2. UA nml. WBC 11.1 - Res viral panel - Mucinex - Tylenol/Ibuprofen  DM:  - SSI  Dementia: advanced. Daughter is his caretaker - continue Aricept and namenda - Ativan and Haldol PRN  HTN: - continue lisinopril in  am  Gout: - continue allopurinol  BPH/Bladder spasm: - ciontinue alfuzosin and myrbetriq   DVT prophylaxis: SCD  Code Status: full  Family Communication: daughter  Disposition Plan: pending workup  Consults called: none  Admission status: observation - tele    Jose Castillo J MD [facility]  If 7PM-7AM, please contact night-coverage [email or web address] Password [email or web address]  07/27/2016, 2:17 PM

## 2016-07-27 NOTE — ED Provider Notes (Signed)
[facility] DEPT Provider Note   CSN: [id number] Arrival date & time: 07/27/16  1017     History   Chief Complaint Chief Complaint  Patient presents with  . Chest Pain    HPI Abb Twigg is a 76 y.o. male.  The history is provided by a relative and medical records. No language interpreter was used.  Chest Pain     Jerryd Dippold is a 76 y.o. male  with a PMH of DM, HLD, demenitia, CAD with stents placed in April 2016 who presents to the Emergency Department complaining of sharp, non-radiating central chest pain which began this morning. Per daughter, patient informed home health nurse of pain. He did not have any associated shortness of breath, n/v, diaphoresis or jaw/shoulder/arm pain. 324 ASA given and EMS called. Pain resolved prior to EMS arrival and patient endorses being chest pain free at this time.   Level V caveat 2/2 dementia.   Past Medical History:  Diagnosis Date  . Dementia   . Diabetes ([place])   . Hyperlipidemia   . Hypotension     Patient Active Problem List   Diagnosis Date Noted  . Diabetic peripheral neuropathy associated with type 2 diabetes mellitus ([place]) 07/05/2016  . Partial retinal detachment of left eye 04/10/2016  . Benign hypertensive heart disease without heart failure 04/10/2016  . OAB (overactive bladder) 04/10/2016  . 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 Coronary artery disease involving native coronary artery of native heart 04/10/2016  . Hyperlipidemia associated with type 2 diabetes mellitus ([place]) 04/05/2016  . Diabetes mellitus type 2 without retinopathy ([place]) 04/05/2016  . Dementia with behavioral disturbance 04/05/2016  . BPH without obstruction/lower urinary tract symptoms 04/05/2016  . Bilateral primary osteoarthritis of knee 04/05/2016    Past Surgical History:  Procedure Laterality Date  . APPENDECTOMY  1961  . TONSILLECTOMY AND ADENOIDECTOMY    . TOTAL KNEE ARTHROPLASTY  2012       Home  Medications    Prior to Admission medications   Medication Sig Start Date End Date Taking? Authorizing Provider  acetaminophen (TYLENOL) 650 MG CR tablet Take 650 mg by mouth daily.   Yes Historical Provider, MD  allopurinol (ZYLOPRIM) 100 MG tablet Take 1 tablet (100 mg total) by mouth daily. 07/26/16  Yes Tiffany L Reed, DO  aspirin EC 81 MG tablet Take 81 mg by mouth daily.   Yes Historical Provider, MD  atorvastatin (LIPITOR) 80 MG tablet Take 1 tablet (80 mg total) by mouth daily. 07/26/16  Yes Tiffany L Reed, DO  clopidogrel (PLAVIX) 75 MG tablet Take 1 tablet (75 mg total) by mouth daily. 07/26/16  Yes Tiffany L Reed, DO  donepezil (ARICEPT) 10 MG tablet Take 1 tablet (10 mg total) by mouth at bedtime. 07/26/16  Yes Tiffany L Reed, DO  gabapentin (NEURONTIN) 100 MG capsule Take 200 mg by mouth at bedtime.   Yes Historical Provider, MD  lisinopril (PRINIVIL,ZESTRIL) 5 MG tablet Take 1 tablet (5 mg total) by mouth daily. 07/26/16  Yes Tiffany L Reed, DO  memantine (NAMENDA) 10 MG tablet Take 1 tablet (10 mg total) by mouth 2 (two) times daily. 06/16/16  Yes Tiffany L Reed, DO  metFORMIN (GLUCOPHAGE) 500 MG tablet Take 1 tablet (500 mg total) by mouth 2 (two) times daily with a meal. 07/26/16  Yes Tiffany L Reed, DO  mirabegron ER (MYRBETRIQ) 50 MG TB24 tablet Take 50 mg by mouth daily.   Yes Historical Provider, MD  nitroGLYCERIN (  NITROSTAT) 0.4 MG SL tablet Place 0.4 mg under the tongue every 5 (five) minutes as needed for chest pain.   Yes Historical Provider, MD  tamsulosin (FLOMAX) 0.4 MG CAPS capsule Take 0.4 mg by mouth at bedtime.   Yes Historical Provider, MD  vitamin B-12 (CYANOCOBALAMIN) 1000 MCG tablet Take 1 tablet (1,000 mcg total) by mouth daily. 07/26/16  Yes [place], DO    Family History Family History  Problem Relation Age of Onset  . Cancer Mother 37  . Heart attack Father 25  . Diabetes Sister   . Brain cancer Sister     Social History Social History  Substance  Use Topics  . Smoking status: Former Smoker    Packs/day: 0.50    Years: 20.00    Types: Cigarettes  . Smokeless tobacco: Never Used  . Alcohol use No     Allergies   Lortab [hydrocodone-acetaminophen] and Morphine and related   Review of Systems Review of Systems  Unable to perform ROS: Dementia  Cardiovascular: Positive for chest pain.     Physical Exam Updated Vital Signs BP 100/70 (BP Location: Right Arm)   Pulse 82   Temp 99.4 F (37.4 C) (Oral)   Resp 20   SpO2 96%   Physical Exam  Constitutional: He appears well-developed and well-nourished. No distress.  HENT:  Head: Normocephalic and atraumatic.  Cardiovascular: Normal rate, regular rhythm, normal heart sounds and intact distal pulses.   No murmur heard. Pulmonary/Chest: Effort normal and breath sounds normal. No respiratory distress. He has no wheezes. He has no rales. He exhibits no tenderness.  Abdominal: Soft. He exhibits no distension. There is no tenderness.  Musculoskeletal: He exhibits no edema.  Neurological: He is alert.  Skin: Skin is warm and dry.  Nursing note and vitals reviewed.    ED Treatments / Results  Labs (all labs ordered are listed, but only abnormal results are displayed) Labs Reviewed  BASIC METABOLIC PANEL - Abnormal; Notable for the following:       Result Value   Glucose, Bld 160 (*)    BUN 25 (*)    All other components within normal limits  CBC - Abnormal; Notable for the following:    WBC 11.1 (*)    RBC 3.46 (*)    Hemoglobin 10.7 (*)    HCT 32.3 (*)    All other components within normal limits  I-STAT TROPOININ, ED    EKG  EKG Interpretation None       Radiology Dg Chest 2 View  Result Date: 07/27/2016 CLINICAL DATA:  76 year old male with a history of chest pain EXAM: CHEST  2 VIEW COMPARISON:  None. FINDINGS: Apical lordotic positioning. Borderline enlarged heart.  Calcifications of the aortic arch. No central vascular congestion. No pneumothorax or  pleural effusion. No confluent airspace disease. Linear opacities at the base on lateral view may reflect atelectasis/ scarring. Nodule of the right upper lung measuring 8 mm-9 mm. No displaced fracture. IMPRESSION: No radiographic evidence of acute cardiopulmonary disease. Nodule of the right upper lung. Referral for pulmonary evaluation and chest CT is recommend, given the apparent lung nodule and that CT lung cancer screening is recommended for patients who are 62-47 years of age with a 30+ pack-year history of smoking, and who are currently smoking or quit <=15 years ago. Signed, Dulcy Fanny. Earleen Newport, DO [facility] Radiology Electronically Signed   By: Corrie Mckusick D.O.   On: 07/27/2016 11:26  Procedures Procedures (including critical care time)  Medications Ordered in ED Medications - No data to display   Initial Impression / Assessment and Plan / ED Course  I have reviewed the triage vital signs and the nursing notes.  Pertinent labs & imaging results that were available during my care of the patient were reviewed by me and considered in my medical decision making (see chart for details).    Burleigh Plass is a 76 y.o. male who presents to ED for chest pain earlier today. 324 ASA given prior to arrival. Patient does have hx of CAD with cath performed in April of 2016 where two stents were placed. Patient notes to be chest pain free upon arrival to ED, however given severe dementia, patient is a poor historian. Rectal temp of 100.2, CXR and UA negative. Troponin negative. CBC with white count of 11.1. Given heart score of 5, age and known disease with stent placement, will admit.   Patient discussed with Dr. Wilson Singer who agrees with treatment plan.    Final Clinical Impressions(s) / ED Diagnoses   Final diagnoses:  None    New Prescriptions New Prescriptions   No medications on file     [place], PA-C 07/27/16 1353      Virgel Manifold, MD 07/28/16 1028

## 2016-07-27 NOTE — ED Triage Notes (Signed)
Pt from [facility] via [facility] with c/o substernal chest pressure starting today.  Pain relieved prior to EMS arrival.  Pt took 324 mg aspirin at home.  EKG unremarkable, lungs clear.  NAD, A&O.

## 2016-07-27 NOTE — ED Notes (Signed)
Ordered meal tray. 

## 2016-07-27 NOTE — Progress Notes (Signed)
Notified admission nurse that pt is on the floor to make MD aware.  Instructed that Dr Marily Memos is aware and others in currently are to be used.  Karie Kirks, Therapist, sports.

## 2016-07-27 NOTE — ED Notes (Signed)
Patient transported to X-ray 

## 2016-07-27 NOTE — Progress Notes (Signed)
Notified Tele sitter pt has [employer] patient safety monitor in his room.  Information asked related to pt and care givers given.  Pt made aware and his daughter Claiborne Billings at bedside made aware and verbalized understanding.  Will continue to monitor.  Karie Kirks, Therapist, sports.

## 2016-07-28 DIAGNOSIS — B342 Coronavirus infection, unspecified: Secondary | ICD-10-CM

## 2016-07-28 DIAGNOSIS — Z87891 Personal history of nicotine dependence: Secondary | ICD-10-CM | POA: Diagnosis not present

## 2016-07-28 DIAGNOSIS — I10 Essential (primary) hypertension: Secondary | ICD-10-CM

## 2016-07-28 DIAGNOSIS — M1A9XX0 Chronic gout, unspecified, without tophus (tophi): Secondary | ICD-10-CM | POA: Diagnosis not present

## 2016-07-28 DIAGNOSIS — Z7984 Long term (current) use of oral hypoglycemic drugs: Secondary | ICD-10-CM | POA: Diagnosis not present

## 2016-07-28 DIAGNOSIS — B9729 Other coronavirus as the cause of diseases classified elsewhere: Secondary | ICD-10-CM | POA: Diagnosis not present

## 2016-07-28 DIAGNOSIS — F0391 Unspecified dementia with behavioral disturbance: Secondary | ICD-10-CM | POA: Diagnosis not present

## 2016-07-28 DIAGNOSIS — J069 Acute upper respiratory infection, unspecified: Secondary | ICD-10-CM | POA: Diagnosis not present

## 2016-07-28 DIAGNOSIS — N400 Enlarged prostate without lower urinary tract symptoms: Secondary | ICD-10-CM | POA: Diagnosis not present

## 2016-07-28 DIAGNOSIS — Z955 Presence of coronary angioplasty implant and graft: Secondary | ICD-10-CM | POA: Diagnosis not present

## 2016-07-28 DIAGNOSIS — R079 Chest pain, unspecified: Secondary | ICD-10-CM

## 2016-07-28 DIAGNOSIS — F0151 Vascular dementia with behavioral disturbance: Secondary | ICD-10-CM

## 2016-07-28 DIAGNOSIS — Z96659 Presence of unspecified artificial knee joint: Secondary | ICD-10-CM | POA: Diagnosis not present

## 2016-07-28 DIAGNOSIS — I2510 Atherosclerotic heart disease of native coronary artery without angina pectoris: Secondary | ICD-10-CM | POA: Diagnosis not present

## 2016-07-28 DIAGNOSIS — E1142 Type 2 diabetes mellitus with diabetic polyneuropathy: Secondary | ICD-10-CM | POA: Diagnosis not present

## 2016-07-28 LAB — BASIC METABOLIC PANEL
Anion gap: 6 (ref 5–15)
BUN: 25 mg/dL — ABNORMAL HIGH (ref 6–20)
CALCIUM: 9.3 mg/dL (ref 8.9–10.3)
CHLORIDE: 102 mmol/L (ref 101–111)
CO2: 27 mmol/L (ref 22–32)
Creatinine, Ser: 1.09 mg/dL (ref 0.61–1.24)
GFR calc non Af Amer: >60 mL/min (ref >60)
GLUCOSE: 111 mg/dL — AB (ref 65–99)
Potassium: 4.3 mmol/L (ref 3.5–5.1)
Sodium: 135 mmol/L (ref 135–145)

## 2016-07-28 LAB — GLUCOSE, CAPILLARY
Glucose-Capillary: 113 mg/dL — ABNORMAL HIGH (ref 65–99)
Glucose-Capillary: 164 mg/dL — ABNORMAL HIGH (ref 65–99)

## 2016-07-28 LAB — CBC
HEMATOCRIT: 31.5 % — AB (ref 39.0–52.0)
HEMOGLOBIN: 10.4 g/dL — AB (ref 13.0–17.0)
MCH: 30.9 pg (ref 26.0–34.0)
MCHC: 33 g/dL (ref 30.0–36.0)
MCV: 93.5 fL (ref 78.0–100.0)
Platelets: 192 10*3/uL (ref 150–400)
RBC: 3.37 MIL/uL — ABNORMAL LOW (ref 4.22–5.81)
RDW: 14.4 % (ref 11.5–15.5)
WBC: 8.5 10*3/uL (ref 4.0–10.5)

## 2016-07-28 MED ORDER — GUAIFENESIN ER 600 MG PO TB12: 1200.0000 mg | ORAL_TABLET | Freq: Two times a day (BID) | ORAL | 0 refills | Status: DC

## 2016-07-28 MED ORDER — SODIUM CHLORIDE 0.9 % IV BOLUS (SEPSIS): 500.0000 mL | Freq: Once | INTRAVENOUS | Status: DC

## 2016-07-28 MED ORDER — LISINOPRIL 5 MG PO TABS: 5.0000 mg | ORAL_TABLET | Freq: Every day | ORAL | 3 refills | Status: DC

## 2016-07-28 MED ORDER — IBUPROFEN 600 MG PO TABS: 600.0000 mg | ORAL_TABLET | Freq: Three times a day (TID) | ORAL | 0 refills | Status: DC | PRN

## 2016-07-28 NOTE — Progress Notes (Signed)
Pt slept on and off overnight, AVA sitter continue, starting of the shift pt was agitated and frequently trying to get out of the bed, Lorazepam 1mg  is provided. After-that pt slept well most of the part, pt's BP is running on soft side, no any significant complain of chest pain noted, will continue to monitor the patient.

## 2016-07-28 NOTE — Clinical Social Work Note (Signed)
Clinical Social Work Assessment  Patient Details  Name: Jose Castillo MRN: [id number] Date of Birth: [birth year]  Date of referral:  07/28/16               Reason for consult:  Facility Placement, Discharge Planning                Permission sought to share information with:  Facility Sport and exercise psychologist, Family Supports Permission granted to share information::  Yes, Verbal Permission Granted  Name::     Shirline Frees  Agency::  [facility]  Relationship::  Daughter  Contact Information:  [phone]  Housing/Transportation Living arrangements for the past 2 months:  [place] of Information:  Medical Team, Facility, Adult Children Patient Interpreter Needed:  None Criminal Activity/Legal Involvement Pertinent to Current Situation/Hospitalization:  No - Comment as needed Significant Relationships:  Adult Children Lives with:  Facility Resident Do you feel safe going back to the place where you live?  Yes Need for family participation in patient care:  Yes (Comment)  Care giving concerns:  Patient is from [place].   Social Worker assessment / plan:  Patient oriented only to self. Patient's daughter not in the room. CSW called patient's daughter. CSW introduced role and explained that discharge planning would be discussed. Patient's daughter confirmed that patient is from [facility] and is transitioning into Memory Care. CSW confirmed with facility as well. Patient is discharging today. Patient's daughter will transport if discharge paperwork is completed by 1:00. She has to take her mother to a doctor's appointment at 2:00. If not, the facility will transport the patient. No further concerns. CSW encouraged patient's daughter to contact CSW as needed. CSW will continue to follow patient and his daughter for support and facilitate discharge back to [facility] once medically stable.  Employment status:  Retired Forensic scientist:  Other (Comment  Required) ([employer]) PT Recommendations:  Not assessed at this time Information / Referral to community resources:  [place], Other (Comment Required) (Patient returning to [facility] Transitioning into Memory Care.)  Patient/Family's Response to care:  Patient oriented only to self. Patient's daughter agreeable to return to [facility]. Patient's daughter supportive and involved in patient's care. Patient appreciated social work intervention.  Patient/Family's Understanding of and Emotional Response to Diagnosis, Current Treatment, and Prognosis:  Patient oriented only to self. Patient's daughter has a good understanding of current admission. Patient's daughter appears happy with hospital care.  Emotional Assessment Appearance:  Appears stated age Attitude/Demeanor/Rapport:  Unable to Assess Affect (typically observed):  Unable to Assess Orientation:  Oriented to Self Alcohol / Substance use:  Never Used Psych involvement (Current and /or in the community):  No (Comment)  Discharge Needs  Concerns to be addressed:  Care Coordination Readmission within the last 30 days:  No Current discharge risk:  Cognitively Impaired, Lives alone Barriers to Discharge:  No Barriers Identified   Candie Chroman, LCSW 07/28/2016, 11:40 AM

## 2016-07-28 NOTE — Clinical Social Work Note (Signed)
CSW facilitated patient discharge including contacting patient family and facility to confirm patient discharge plans. Clinical information faxed to facility and family agreeable with plan. The security team at [facility] will transport him back there within the hour. RN to call report prior to discharge [phone]).   CSW will sign off for now as social work intervention is no longer needed. Please consult [place] again if new needs arise.  Dayton Scrape, [place]

## 2016-07-28 NOTE — NC FL2 (Signed)
[place] LEVEL OF CARE SCREENING TOOL     IDENTIFICATION  Patient Name: Jose Castillo Birthdate: [birth year] Sex: male Admission Date (Current Location): 07/27/2016  [facility] and [place] Number:  Herbalist and Address:  [facility], [place] [address], [place], [place] [address]      Provider Number: [id number]  Attending Physician Name and Address:  Louellen Molder, MD  Relative Name and Phone Number:       Current Level of Care: Hospital Recommended Level of Care: Memory Care, [place] Prior Approval Number:    Date Approved/Denied:   PASRR Number: Manual review  Discharge Plan: SNF (Memory Care)    Current Diagnoses: Patient Active Problem List   Diagnosis Date Noted  . Coronavirus infection 07/28/2016  . Chest pain 07/27/2016  . Diabetes mellitus with complication ([place]) 123456  . URI (upper respiratory infection) 07/27/2016  . Essential hypertension 07/27/2016  . Diabetic peripheral neuropathy associated with type 2 diabetes mellitus ([place]) 07/05/2016  . Partial retinal detachment of left eye 04/10/2016  . Benign hypertensive heart disease without heart failure 04/10/2016  . OAB (overactive bladder) 04/10/2016  . 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 Coronary artery disease involving native coronary artery of native heart 04/10/2016  . Hyperlipidemia associated with type 2 diabetes mellitus ([place]) 04/05/2016  . Diabetes mellitus type 2 without retinopathy ([place]) 04/05/2016  . Dementia with behavioral disturbance 04/05/2016  . BPH without obstruction/lower urinary tract symptoms 04/05/2016  . Bilateral primary osteoarthritis of knee 04/05/2016    Orientation RESPIRATION BLADDER Height & Weight     Self  Normal Incontinent Weight: 155 lb 6.4 oz (70.5 kg) (scale a) Height:  5\' 5"  (165.1 cm)  BEHAVIORAL SYMPTOMS/MOOD NEUROLOGICAL BOWEL NUTRITION STATUS   (Poor judgement and safety  awareness)  (Dementia with behavioral disturbance) Continent Diet (Regular)  AMBULATORY STATUS COMMUNICATION OF NEEDS Skin     Verbally Normal                       Personal Care Assistance Level of Assistance              Functional Limitations Info  Sight, Hearing, Speech Sight Info: Adequate Hearing Info: Adequate Speech Info: Adequate    SPECIAL CARE FACTORS FREQUENCY  Blood pressure, Diabetic urine testing                    Contractures Contractures Info: Not present    Additional Factors Info  Code Status, Allergies, Isolation Precautions Code Status Info: Full Allergies Info: Lortab (Hydrocodone-acetaminophen, Morphine and related     Isolation Precautions Info: Droplet     Current Medications (07/28/2016):  This is the current hospital active medication list Current Facility-Administered Medications  Medication Dose Route Frequency Provider Last Rate Last Dose  . 0.9 %  sodium chloride infusion  250 mL Intravenous PRN Waldemar Dickens, MD      . acetaminophen (TYLENOL) tablet 650 mg  650 mg Oral Q6H PRN Waldemar Dickens, MD       Or  . acetaminophen (TYLENOL) suppository 650 mg  650 mg Rectal Q6H PRN Waldemar Dickens, MD      . alfuzosin (UROXATRAL) 24 hr tablet 10 mg  10 mg Oral QHS Waldemar Dickens, MD   10 mg at 07/27/16 2100  . allopurinol (ZYLOPRIM) tablet 100 mg  100 mg Oral Daily Waldemar Dickens, MD   100  mg at 07/28/16 0934  . aspirin EC tablet 81 mg  81 mg Oral Daily Waldemar Dickens, MD   81 mg at 07/28/16 0934  . atorvastatin (LIPITOR) tablet 80 mg  80 mg Oral Daily Waldemar Dickens, MD   80 mg at 07/28/16 0934  . clopidogrel (PLAVIX) tablet 75 mg  75 mg Oral Daily Waldemar Dickens, MD   75 mg at 07/28/16 0934  . donepezil (ARICEPT) tablet 10 mg  10 mg Oral QHS Waldemar Dickens, MD   10 mg at 07/27/16 2100  . gabapentin (NEURONTIN) capsule 200 mg  200 mg Oral QHS Waldemar Dickens, MD   200 mg at 07/27/16 2100  . guaiFENesin (MUCINEX) 12 hr tablet  1,200 mg  1,200 mg Oral BID Waldemar Dickens, MD   1,200 mg at 07/28/16 [id number]  . haloperidol lactate (HALDOL) injection 2-5 mg  2-5 mg Intravenous Q6H PRN Waldemar Dickens, MD      . ibuprofen (ADVIL,MOTRIN) tablet 600 mg  600 mg Oral Q6H PRN Waldemar Dickens, MD      . insulin aspart (novoLOG) injection 0-5 Units  0-5 Units Subcutaneous QHS Waldemar Dickens, MD   2 Units at 07/27/16 2120  . insulin aspart (novoLOG) injection 0-9 Units  0-9 Units Subcutaneous TID WC Waldemar Dickens, MD   1 Units at 07/27/16 1832  . lisinopril (PRINIVIL,ZESTRIL) tablet 5 mg  5 mg Oral Daily Waldemar Dickens, MD      . LORazepam (ATIVAN) injection 0.5-1 mg  0.5-1 mg Intravenous Q4H PRN Waldemar Dickens, MD   1 mg at 07/27/16 2120  . memantine (NAMENDA) tablet 10 mg  10 mg Oral BID Waldemar Dickens, MD   10 mg at 07/28/16 0934  . mirabegron ER (MYRBETRIQ) tablet 50 mg  50 mg Oral Daily Waldemar Dickens, MD   50 mg at 07/28/16 1004  . sodium chloride 0.9 % bolus 500 mL  500 mL Intravenous Once Nishant Dhungel, MD      . sodium chloride flush (NS) 0.9 % injection 3 mL  3 mL Intravenous Q12H Waldemar Dickens, MD   3 mL at 07/27/16 1832  . sodium chloride flush (NS) 0.9 % injection 3 mL  3 mL Intravenous Q12H Waldemar Dickens, MD   3 mL at 07/27/16 2100  . sodium chloride flush (NS) 0.9 % injection 3 mL  3 mL Intravenous PRN Waldemar Dickens, MD         Discharge Medications: Please see discharge summary for a list of discharge medications.  Relevant Imaging Results:  Relevant Lab Results:   Additional Information SS#: [id number]  Candie Chroman, LCSW

## 2016-07-28 NOTE — Discharge Summary (Signed)
Physician Discharge Summary  Jose Castillo [id number] DOB: 03-Mar-1941 DOA: 07/27/2016  PCP: Hollace Kinnier, DO  Admit date: 07/27/2016 Discharge date: 07/28/2016  Admitted From: independent living Disposition:  Independent living ( memory care unit)  Recommendations for Outpatient Follow-up:  1. Follow up with PCP in 1-2 weeks 2. Patient has a right upper lung nodule measuring 8-9 mm. Recommend chest CT as outpatient and pulmonary referral if needed.  Home Health:none Equipment/Devices: none  Discharge Condition: fair CODE STATUS:full code Diet recommendation: Heart Healthy / Carb Modified     Discharge Diagnoses:  Principal Problem:   URI (upper respiratory infection)   Active Problems:   Dementia with behavioral disturbance   Coronary artery disease involving native coronary artery of native heart   Chest pain   Diabetes mellitus with complication (HCC)   Essential hypertension   Coronavirus infection  Brief narrative/history of present illness 76 y.o. male with medical history significant of severe dementia, DM, HLD, Hypotension. History was provided by patient's daughter. Sent from independent living with sharp nonradiating substernal chest pain on the morning of admission. This complaint was elicited by patient's daughter who then informed nursing home staff. Patient was given aspirin 324 mg w/ relief of CP. Was exertional . Denies any associated symptoms such as shortness of breath, palpitations, nausea, vomiting, diaphoresis, radiation to jaw or shoulder. Patient complained of 2-3 day h/o increasing head congestion, mild body aches.   In the [facility] found to have a low-grade fever and soft blood pressure. Sats were stable. Blood work done showed mild leukocytosis, chronic anemia. EKG and troponin was negative. Placed under observation.  Hospital course Chest pain Appears to be associated with acute viral illness. Has history of coronary artery disease with  cardiac cath and stent placement in 2016. No further chest pain symptoms. Serial troponin negative and repeat EKG unremarkable. As per daughter he has been under a lot of stress lately since his wife has been admitted to the hospital. Resume home medications. No further symptoms.  URI with positive coronavirus Recommend supportive care with Tylenol/ibuprofen and antitussives. Encouraged hydration.  Diabetes mellitus type 2 Resume home medications  Moderate to severe dementia Symptoms of advanced per daughter. Continue Aricept and Namenda.  Chronic gout Continue allopurinol  BPH Continue  alfuzosin and myrbetriq   Consults: None Procedures: None  Family communication: Daughter at bedside  Disposition: Return to facility (daughte plans to place him in the memory care unit)  Discharge Instructions   Allergies as of 07/28/2016      Reactions   Lortab [hydrocodone-acetaminophen] Other (See Comments)   Reaction:  Affected pts cognition    Morphine And Related Other (See Comments)   Reaction:  Affected pts cognition       Medication List    TAKE these medications   acetaminophen 650 MG CR tablet Commonly known as:  TYLENOL Take 650 mg by mouth daily.   alfuzosin 10 MG 24 hr tablet Commonly known as:  UROXATRAL Take 10 mg by mouth at bedtime.   allopurinol 100 MG tablet Commonly known as:  ZYLOPRIM Take 1 tablet (100 mg total) by mouth daily.   aspirin EC 81 MG tablet Take 81 mg by mouth daily.   atorvastatin 80 MG tablet Commonly known as:  LIPITOR Take 1 tablet (80 mg total) by mouth daily.   clopidogrel 75 MG tablet Commonly known as:  PLAVIX Take 1 tablet (75 mg total) by mouth daily.   donepezil 10 MG tablet Commonly known as:  ARICEPT Take 1 tablet (10 mg total) by mouth at bedtime.   gabapentin 100 MG capsule Commonly known as:  NEURONTIN Take 200 mg by mouth at bedtime.   guaiFENesin 600 MG 12 hr tablet Commonly known as:  MUCINEX Take 2  tablets (1,200 mg total) by mouth 2 (two) times daily.   ibuprofen 600 MG tablet Commonly known as:  ADVIL,MOTRIN Take 1 tablet (600 mg total) by mouth every 8 (eight) hours as needed for fever or moderate pain.   lisinopril 5 MG tablet Commonly known as:  PRINIVIL,ZESTRIL Take 1 tablet (5 mg total) by mouth daily.   memantine 10 MG tablet Commonly known as:  NAMENDA Take 1 tablet (10 mg total) by mouth 2 (two) times daily.   metFORMIN 500 MG tablet Commonly known as:  GLUCOPHAGE Take 1 tablet (500 mg total) by mouth 2 (two) times daily with a meal.   MYRBETRIQ 50 MG Tb24 tablet Generic drug:  mirabegron ER Take 50 mg by mouth daily.   nitroGLYCERIN 0.4 MG SL tablet Commonly known as:  NITROSTAT Place 0.4 mg under the tongue every 5 (five) minutes as needed for chest pain.   vitamin B-12 1000 MCG tablet Commonly known as:  CYANOCOBALAMIN Take 1 tablet (1,000 mcg total) by mouth daily.      Follow-up Information    REED, TIFFANY, DO Follow up in 1 week(s).   Specialty:  Geriatric Medicine Contact information: [place] [address] [phone]          Allergies  Allergen Reactions  . Lortab [Hydrocodone-Acetaminophen] Other (See Comments)    Reaction:  Affected pts cognition   . Morphine And Related Other (See Comments)    Reaction:  Affected pts cognition       Procedures/Studies: Dg Chest 2 View  Result Date: 07/27/2016 CLINICAL DATA:  76 year old male with a history of chest pain EXAM: CHEST  2 VIEW COMPARISON:  None. FINDINGS: Apical lordotic positioning. Borderline enlarged heart.  Calcifications of the aortic arch. No central vascular congestion. No pneumothorax or pleural effusion. No confluent airspace disease. Linear opacities at the base on lateral view may reflect atelectasis/ scarring. Nodule of the right upper lung measuring 8 mm-9 mm. No displaced fracture. IMPRESSION: No radiographic evidence of acute cardiopulmonary disease.  Nodule of the right upper lung. Referral for pulmonary evaluation and chest CT is recommend, given the apparent lung nodule and that CT lung cancer screening is recommended for patients who are 29-16 years of age with a 30+ pack-year history of smoking, and who are currently smoking or quit <=15 years ago. Signed, Dulcy Fanny. Earleen Newport, DO [facility] Radiology Electronically Signed   By: Corrie Mckusick D.O.   On: 07/27/2016 11:26       Subjective: Has some worsened confusion this morning. Appears congested.  Discharge Exam: Vitals:   07/27/16 2100 07/28/16 0650  BP: (!) 91/56 (!) 107/54  Pulse:  64  Resp: 18 18  Temp: 98.4 F (36.9 C) 98.6 F (37 C)   Vitals:   07/27/16 1530 07/27/16 1554 07/27/16 2100 07/28/16 0650  BP: (!) 89/58 (!) 96/58 (!) 91/56 (!) 107/54  Pulse: 70 76  64  Resp: 17 20 18 18   Temp:  98.4 F (36.9 C) 98.4 F (36.9 C) 98.6 F (37 C)  TempSrc:  Oral Oral Oral  SpO2: 95% 100% 100% 95%  Weight:  67.7 kg (149 lb 3.2 oz)  70.5 kg (155 lb 6.4 oz)  Height:  5\' 5"  (1.651 m)      General: Elderly male somnolent but easily arousable, appears congested, not in distress HEENT: Nasal congestion, moist mucosa, supple neck Chest: Clear to auscultation bilaterally CVS: Normal S1 and S2, no murmurs GI: Soft, nondistended, nontender Musculoskeletal: Warm, no edema CNS: Alert and oriented 1-2     The results of significant diagnostics from this hospitalization (including imaging, microbiology, ancillary and laboratory) are listed below for reference.     Microbiology: Recent Results (from the past 240 hour(s))  Respiratory Panel by PCR     Status: Abnormal   Collection Time: 07/27/16  2:14 PM  Result Value Ref Range Status   Adenovirus NOT DETECTED NOT DETECTED Final   Coronavirus 229E NOT DETECTED NOT DETECTED Final   Coronavirus HKU1 DETECTED (A) NOT DETECTED Final   Coronavirus NL63 NOT DETECTED NOT DETECTED  Final   Coronavirus OC43 NOT DETECTED NOT DETECTED Final   Metapneumovirus NOT DETECTED NOT DETECTED Final   Rhinovirus / Enterovirus NOT DETECTED NOT DETECTED Final   Influenza A NOT DETECTED NOT DETECTED Final   Influenza B NOT DETECTED NOT DETECTED Final   Parainfluenza Virus 1 NOT DETECTED NOT DETECTED Final   Parainfluenza Virus 2 NOT DETECTED NOT DETECTED Final   Parainfluenza Virus 3 NOT DETECTED NOT DETECTED Final   Parainfluenza Virus 4 NOT DETECTED NOT DETECTED Final   Respiratory Syncytial Virus NOT DETECTED NOT DETECTED Final   Bordetella pertussis NOT DETECTED NOT DETECTED Final   Chlamydophila pneumoniae NOT DETECTED NOT DETECTED Final   Mycoplasma pneumoniae NOT DETECTED NOT DETECTED Final  MRSA PCR Screening     Status: None   Collection Time: 07/27/16  4:28 PM  Result Value Ref Range Status   MRSA by PCR NEGATIVE NEGATIVE Final    Comment:        The GeneXpert MRSA Assay (FDA approved for NASAL specimens only), is one component of a comprehensive MRSA colonization surveillance program. It is not intended to diagnose MRSA infection nor to guide or monitor treatment for MRSA infections.      Labs: BNP (last 3 results) No results for input(s): BNP in the last 8760 hours. Basic Metabolic Panel:  Recent Labs Lab 07/27/16 1043 07/28/16 0455  NA 137 135  K 5.1 4.3  CL 102 102  CO2 26 27  GLUCOSE 160* 111*  BUN 25* 25*  CREATININE 1.09 1.09  CALCIUM 9.5 9.3   Liver Function Tests: No results for input(s): AST, ALT, ALKPHOS, BILITOT, PROT, ALBUMIN in the last 168 hours. No results for input(s): LIPASE, AMYLASE in the last 168 hours. No results for input(s): AMMONIA in the last 168 hours. CBC:  Recent Labs Lab 07/27/16 1043 07/28/16 0455  WBC 11.1* 8.5  HGB 10.7* 10.4*  HCT 32.3* 31.5*  MCV 93.4 93.5  PLT 186 192   Cardiac Enzymes:  Recent Labs Lab 07/27/16 1420 07/27/16 1657 07/27/16 1956  TROPONINI <0.03 <0.03 <0.03   BNP: Invalid  input(s): POCBNP CBG:  Recent Labs Lab 07/27/16 1621 07/27/16 2055 07/28/16 0546  GLUCAP 121* 243* 113*   D-Dimer No results for input(s): DDIMER in the last 72 hours. Hgb A1c No results for input(s): HGBA1C in the last 72 hours. Lipid Profile No results for input(s): CHOL, HDL, LDLCALC, TRIG, CHOLHDL, LDLDIRECT in the last 72 hours. Thyroid function studies No results for input(s): TSH, T4TOTAL, T3FREE, THYROIDAB in the last 72 hours.  Invalid input(s): FREET3 Anemia work up No results for input(s): VITAMINB12, FOLATE, FERRITIN, TIBC, IRON,  RETICCTPCT in the last 72 hours. Urinalysis    Component Value Date/Time   COLORURINE YELLOW 07/27/2016 [place] 07/27/2016 1253   LABSPEC 1.015 07/27/2016 1253   PHURINE 7.0 07/27/2016 1253   GLUCOSEU NEGATIVE 07/27/2016 1253   HGBUR MODERATE (A) 07/27/2016 1253   BILIRUBINUR NEGATIVE 07/27/2016 1253   KETONESUR NEGATIVE 07/27/2016 1253   PROTEINUR NEGATIVE 07/27/2016 1253   NITRITE NEGATIVE 07/27/2016 1253   LEUKOCYTESUR NEGATIVE 07/27/2016 1253   Sepsis Labs Invalid input(s): PROCALCITONIN,  WBC,  LACTICIDVEN Microbiology Recent Results (from the past 240 hour(s))  Respiratory Panel by PCR     Status: Abnormal   Collection Time: 07/27/16  2:14 PM  Result Value Ref Range Status   Adenovirus NOT DETECTED NOT DETECTED Final   Coronavirus 229E NOT DETECTED NOT DETECTED Final   Coronavirus HKU1 DETECTED (A) NOT DETECTED Final   Coronavirus NL63 NOT DETECTED NOT DETECTED Final   Coronavirus OC43 NOT DETECTED NOT DETECTED Final   Metapneumovirus NOT DETECTED NOT DETECTED Final   Rhinovirus / Enterovirus NOT DETECTED NOT DETECTED Final   Influenza A NOT DETECTED NOT DETECTED Final   Influenza B NOT DETECTED NOT DETECTED Final   Parainfluenza Virus 1 NOT DETECTED NOT DETECTED Final   Parainfluenza Virus 2 NOT DETECTED NOT DETECTED Final   Parainfluenza Virus 3 NOT DETECTED NOT DETECTED Final   Parainfluenza Virus  4 NOT DETECTED NOT DETECTED Final   Respiratory Syncytial Virus NOT DETECTED NOT DETECTED Final   Bordetella pertussis NOT DETECTED NOT DETECTED Final   Chlamydophila pneumoniae NOT DETECTED NOT DETECTED Final   Mycoplasma pneumoniae NOT DETECTED NOT DETECTED Final  MRSA PCR Screening     Status: None   Collection Time: 07/27/16  4:28 PM  Result Value Ref Range Status   MRSA by PCR NEGATIVE NEGATIVE Final    Comment:        The GeneXpert MRSA Assay (FDA approved for NASAL specimens only), is one component of a comprehensive MRSA colonization surveillance program. It is not intended to diagnose MRSA infection nor to guide or monitor treatment for MRSA infections.      Time coordinating discharge: < 30 minutes  SIGNED:   Louellen Molder, MD  [facility] 07/28/2016, 9:29 AM Pager   If 7PM-7AM, please contact night-coverage [email or web address] Password [email or web address]

## 2016-07-28 NOTE — Progress Notes (Signed)
Gave report to Nelly Rout Nurse at [facility], AVS Given to transporter from [facility].

## 2016-07-28 NOTE — Progress Notes (Signed)
Spoke with infection control states that pt does not need Droplet precaution. Take standard precaution.

## 2016-07-28 NOTE — Progress Notes (Signed)
Pt is alert and confused, states that he feels a light headed, upon walking, Blp- 113/83 after sitting waiting, on transport from [facility].

## 2016-07-31 ENCOUNTER — Telehealth: Payer: Self-pay

## 2016-07-31 NOTE — Telephone Encounter (Signed)
Possible re-admission to facility. This is a patient you were seeing at [facility] . [facility] F/U is needed if patient was re-admitted to facility upon discharge. Hospital discharge from [facility] on 07/28/16.

## 2016-08-01 ENCOUNTER — Non-Acute Institutional Stay (SKILLED_NURSING_FACILITY): Payer: PPO | Admitting: Internal Medicine

## 2016-08-01 ENCOUNTER — Encounter: Payer: Self-pay | Admitting: Internal Medicine

## 2016-08-01 DIAGNOSIS — I25118 Atherosclerotic heart disease of native coronary artery with other forms of angina pectoris: Secondary | ICD-10-CM

## 2016-08-01 DIAGNOSIS — R071 Chest pain on breathing: Secondary | ICD-10-CM | POA: Diagnosis not present

## 2016-08-01 DIAGNOSIS — H3322 Serous retinal detachment, left eye: Secondary | ICD-10-CM | POA: Diagnosis not present

## 2016-08-01 DIAGNOSIS — E785 Hyperlipidemia, unspecified: Secondary | ICD-10-CM

## 2016-08-01 DIAGNOSIS — B342 Coronavirus infection, unspecified: Secondary | ICD-10-CM

## 2016-08-01 DIAGNOSIS — N3281 Overactive bladder: Secondary | ICD-10-CM | POA: Diagnosis not present

## 2016-08-01 DIAGNOSIS — E1142 Type 2 diabetes mellitus with diabetic polyneuropathy: Secondary | ICD-10-CM | POA: Diagnosis not present

## 2016-08-01 DIAGNOSIS — E1169 Type 2 diabetes mellitus with other specified complication: Secondary | ICD-10-CM | POA: Diagnosis not present

## 2016-08-01 DIAGNOSIS — N400 Enlarged prostate without lower urinary tract symptoms: Secondary | ICD-10-CM | POA: Diagnosis not present

## 2016-08-01 DIAGNOSIS — M170 Bilateral primary osteoarthritis of knee: Secondary | ICD-10-CM

## 2016-08-01 DIAGNOSIS — F0391 Unspecified dementia with behavioral disturbance: Secondary | ICD-10-CM | POA: Diagnosis not present

## 2016-08-01 NOTE — Telephone Encounter (Signed)
Will see today in memory care.

## 2016-08-01 NOTE — Progress Notes (Signed)
Patient ID: Jose Castillo, male   DOB: 1940/10/08, 76 y.o.   MRN: [id number]  Location:  [facility] Room Number: [place] of Service:  SNF (31) Provider:  Sherman Donaldson L. Mariea Clonts, D.O., C.M.D.  Hollace Kinnier, DO  Patient Care Team: [name], DO as PCP - General (Geriatric Medicine)  Extended Emergency Contact Information Primary Emergency Contact: Alley,Kelly Address: [address]          [place], [place] [address] [name] of [place] Phone: [phone] Relation: Daughter  Code Status:  DNR Goals of care: Advanced Directive information Advanced Directives 08/01/2016  Does Patient Have a Medical Advance Directive? Yes  Type of Paramedic of [employer];Living will  Does patient want to make changes to medical advance directive? No - Patient declined  Copy of [place] in Chart? Yes   Chief Complaint  Patient presents with  . Transitions Of Care    07/27/2016 - 07/28/2016 (27 hours)    HPI:  Pt is a 76 y.o. male with h/o dementia, said to be frontotemporal, CAD with stents from 4/16, DMII, and hyperlipidemia was seen today for a hospital f/u s/p admission from 1/25-1/26/18 at [facility] for URI/coronavirus.  He had presented with central chest pain since that morning and his daughter informed the home health nurse of the symptom.  EMS was called.  He had no sob, n/v/d, diaphoresis, jaw/shoulder/arm pain.  ASA was given also.  Pain resolved before EMS arrived and he remained chest pain free.  ED labs remarkable for mild leukocytosis at 11.1, glucose 160 and anemia with hgb 10.7 and vitals significant for T rectally of 100.2.  CXR, UA, troponin negative.  He was admitted.  His wife has been very ill with metastatic breast cancer.  troponins were cycled and remained unremarkable, asa and plavix continued.  Viral panel was done that revealed coronavirus positive, flu negative.  He was treated with  mucinex and ibuprofen.  Ativan and haldol were ordered as needed along with a sitter due to his dementia, but he became very agitated with the ativan 1mg  per his daughter.  BPs remained on the low side.    He returned here to the memory care unit for long term care.  He was continued on his aricept and namenda.  He's had agitation off and on since arrival and was wandering into other patients' rooms.  His wife actually passed away while he was at the hospital somewhat suddenly here in rehab.  When I saw him, that's what he told me.  He said he is adjusting to that fact, but does not know what to do.  Upon speaking with staff and his daughter, we opted to start him on zoloft therapy low dose to help him adjust to loss of his wife and his new living environment in the memory care unit.  Otherwise, he's doing well with very little residual respiratory symptoms.  Past Medical History:  Diagnosis Date  . Dementia   . Diabetes ([place])   . Hyperlipidemia   . Hypotension    Past Surgical History:  Procedure Laterality Date  . APPENDECTOMY  1961  . TONSILLECTOMY AND ADENOIDECTOMY    . TOTAL KNEE ARTHROPLASTY  2012    Allergies  Allergen Reactions  . Lortab [Hydrocodone-Acetaminophen] Other (See Comments)    Reaction:  Affected pts cognition   . Morphine And Related Other (See Comments)    Reaction:  Affected pts cognition  Allergies as of 08/01/2016      Reactions   Lortab [hydrocodone-acetaminophen] Other (See Comments)   Reaction:  Affected pts cognition    Morphine And Related Other (See Comments)   Reaction:  Affected pts cognition       Medication List       Accurate as of 08/01/16  3:21 PM. Always use your most recent med list.          acetaminophen 650 MG CR tablet Commonly known as:  TYLENOL Take 650 mg by mouth daily.   alfuzosin 10 MG 24 hr tablet Commonly known as:  UROXATRAL Take 10 mg by mouth at bedtime.   allopurinol 100 MG tablet Commonly known as:   ZYLOPRIM Take 1 tablet (100 mg total) by mouth daily.   aspirin EC 81 MG tablet Take 81 mg by mouth daily.   atorvastatin 80 MG tablet Commonly known as:  LIPITOR Take 1 tablet (80 mg total) by mouth daily.   clopidogrel 75 MG tablet Commonly known as:  PLAVIX Take 1 tablet (75 mg total) by mouth daily.   donepezil 10 MG tablet Commonly known as:  ARICEPT Take 1 tablet (10 mg total) by mouth at bedtime.   gabapentin 100 MG capsule Commonly known as:  NEURONTIN Take 200 mg by mouth at bedtime.   guaiFENesin 600 MG 12 hr tablet Commonly known as:  MUCINEX Take 2 tablets (1,200 mg total) by mouth 2 (two) times daily.   ibuprofen 600 MG tablet Commonly known as:  ADVIL,MOTRIN Take 1 tablet (600 mg total) by mouth every 8 (eight) hours as needed for fever or moderate pain.   memantine 10 MG tablet Commonly known as:  NAMENDA Take 1 tablet (10 mg total) by mouth 2 (two) times daily.   metFORMIN 500 MG tablet Commonly known as:  GLUCOPHAGE Take 1 tablet (500 mg total) by mouth 2 (two) times daily with a meal.   MYRBETRIQ 50 MG Tb24 tablet Generic drug:  mirabegron ER Take 50 mg by mouth daily.   nitroGLYCERIN 0.4 MG SL tablet Commonly known as:  NITROSTAT Place 0.4 mg under the tongue every 5 (five) minutes as needed for chest pain.   vitamin B-12 1000 MCG tablet Commonly known as:  CYANOCOBALAMIN Take 1 tablet (1,000 mcg total) by mouth daily.       Review of Systems  Constitutional: Negative for activity change, appetite change, chills, fatigue, fever and unexpected weight change.  HENT: Positive for congestion and hearing loss. Negative for sore throat.   Eyes: Negative for visual disturbance.       Glasses  Respiratory: Positive for cough. Negative for apnea, choking, chest tightness, shortness of breath, wheezing and stridor.   Cardiovascular: Negative for chest pain, palpitations and leg swelling.  Gastrointestinal: Negative for abdominal distention,  abdominal pain, blood in stool, constipation, diarrhea and nausea.  Genitourinary: Positive for urgency.       Urinary frequency  Musculoskeletal: Positive for arthralgias.       Knee  Skin: Negative for color change.  Neurological: Negative for dizziness and weakness.  Hematological: Bruises/bleeds easily.  Psychiatric/Behavioral: Positive for confusion. Negative for agitation, behavioral problems, sleep disturbance and suicidal ideas. The patient is nervous/anxious.        Some depression at present     Immunization History  Administered Date(s) Administered  . Influenza-Unspecified 05/05/2010, 04/11/2013, 03/03/2015  . Pneumococcal Polysaccharide-23 12/25/2010  . Pneumococcal-Unspecified 02/28/2013  . Tdap 03/22/2006  . Zoster 11/01/2012   Pertinent  Health  Maintenance Due  Topic Date Due  . FOOT EXAM  01/15/1950  . OPHTHALMOLOGY EXAM  01/15/1950  . COLONOSCOPY  07/31/2013  . PNA vac Low Risk Adult (2 of 2 - PCV13) 02/28/2014  . INFLUENZA VACCINE  02/01/2016  . HEMOGLOBIN A1C  01/08/2017   Fall Risk  04/05/2016  Falls in the past year? Yes  Number falls in past yr: 1  Injury with Fall? Yes   Functional Status Survey:    Vitals:   08/01/16 1439  BP: 106/73  Pulse: 61  Resp: 20  Temp: 97.2 F (36.2 C)  TempSrc: Oral  SpO2: 99%  Weight: 160 lb (72.6 kg)   Body mass index is 26.63 kg/m. Physical Exam  Constitutional: He appears well-developed and well-nourished. No distress.  HENT:  Nasal congestion  Neck: Neck supple. No JVD present.  Cardiovascular: Normal rate, regular rhythm, normal heart sounds and intact distal pulses.   Pulmonary/Chest: Effort normal and breath sounds normal. No respiratory distress. He has no wheezes. He has no rales.  No rhonchi  Abdominal: Soft. Bowel sounds are normal. He exhibits no distension. There is no tenderness.  Musculoskeletal: Normal range of motion.  Ambulates w/o assistive device  Lymphadenopathy:    He has no  cervical adenopathy.  Neurological: He is alert.  Oriented to person and general location of [facility]  Skin: Skin is warm and dry.  Psychiatric:  Flat affect    Labs reviewed:  Recent Labs  07/11/16 07/27/16 1043 07/28/16 0455  NA 140 137 135  K 5.1 5.1 4.3  CL  --  102 102  CO2  --  26 27  GLUCOSE  --  160* 111*  BUN 51* 25* 25*  CREATININE 1.4* 1.09 1.09  CALCIUM  --  9.5 9.3    Recent Labs  07/11/16  AST 14  ALT 16  ALKPHOS 117    Recent Labs  07/11/16 07/27/16 1043 07/28/16 0455  WBC 7.5 11.1* 8.5  HGB 12.2* 10.7* 10.4*  HCT 37* 32.3* 31.5*  MCV  --  93.4 93.5  PLT 197 186 192   No results found for: TSH Lab Results  Component Value Date   HGBA1C 8.1 07/11/2016   Lab Results  Component Value Date   CHOL 149 07/11/2016   HDL 63 07/11/2016   LDLCALC 63 07/11/2016   TRIG 116 07/11/2016    Significant Diagnostic Results in last 30 days:  Dg Chest 2 View  Result Date: 07/27/2016 CLINICAL DATA:  76 year old male with a history of chest pain EXAM: CHEST  2 VIEW COMPARISON:  None. FINDINGS: Apical lordotic positioning. Borderline enlarged heart.  Calcifications of the aortic arch. No central vascular congestion. No pneumothorax or pleural effusion. No confluent airspace disease. Linear opacities at the base on lateral view may reflect atelectasis/ scarring. Nodule of the right upper lung measuring 8 mm-9 mm. No displaced fracture. IMPRESSION: No radiographic evidence of acute cardiopulmonary disease. Nodule of the right upper lung. Referral for pulmonary evaluation and chest CT is recommend, given the apparent lung nodule and that CT lung cancer screening is recommended for patients who are 71-92 years of age with a 30+ pack-year history of smoking, and who are currently smoking or quit <=15 years ago. Signed, Dulcy Fanny. Earleen Newport, DO [facility] Radiology Electronically Signed   By: Corrie Mckusick D.O.   On: 07/27/2016  11:26    Assessment/Plan 1. Chest pain on breathing -resolved--was related to stress and #2  2. Coronavirus  infection -getting better, cont mucinex for congestion, hydration, tylenol daily for OA pain and myalgias from this  3. Dementia with behavioral disturbance, unspecified dementia type -said to be frontotemporal but does not seem typical to me (suspect more AD or mixed AD/vascular) -is to see neurology, Dr. Jaynee Eagles - his wife had an MRI on CD, but no report and I couldn't open it  4. Coronary artery disease of native artery of native heart with stable angina pectoris (HCC) -cont prn ntg, plavix, asa, statin; bp runs too low for beta blocker or ace  5. Hyperlipidemia associated with type 2 diabetes mellitus ([place]) -cont statin therapy due to DMII and CAD  6. OAB (overactive bladder) -cont myrbetriq  7. BPH without obstruction/lower urinary tract symptoms -to see urology -cont myrbetriq, alfuzosin for this--had some improvement by report with the additional med  8. Diabetic peripheral neuropathy associated with type 2 diabetes mellitus (HCC) -cont current regimen including metformin, asa, lipitor especially with his CAD--will need to readdress over time as dementia progresses  9. Bilateral primary osteoarthritis of knee -had one knee replacement, other needs it  10. Partial retinal detachment of left eye -noted, had surgery on this; will need ophtho following him  Family/ staff Communication: discussed with memory care nursing, pt's daughter, DON  Labs/tests ordered:  No new

## 2016-08-08 ENCOUNTER — Encounter: Payer: Self-pay | Admitting: Internal Medicine

## 2016-08-08 ENCOUNTER — Non-Acute Institutional Stay (SKILLED_NURSING_FACILITY): Payer: PPO | Admitting: Internal Medicine

## 2016-08-08 DIAGNOSIS — M79672 Pain in left foot: Secondary | ICD-10-CM | POA: Diagnosis not present

## 2016-08-08 DIAGNOSIS — M79671 Pain in right foot: Secondary | ICD-10-CM | POA: Diagnosis not present

## 2016-08-08 DIAGNOSIS — G253 Myoclonus: Secondary | ICD-10-CM

## 2016-08-08 DIAGNOSIS — L602 Onychogryphosis: Secondary | ICD-10-CM | POA: Diagnosis not present

## 2016-08-08 DIAGNOSIS — E119 Type 2 diabetes mellitus without complications: Secondary | ICD-10-CM | POA: Diagnosis not present

## 2016-08-08 NOTE — Progress Notes (Signed)
Location:  Occupational psychologist of Service:  SNF (31) Provider:  Hedda Crumbley L. Mariea Clonts, D.O., C.M.D.  Hollace Kinnier, DO  Patient Care Team: [name], DO as PCP - General (Geriatric Medicine)  Extended Emergency Contact Information Primary Emergency Contact: Alley,Kelly Address: [address]          [place], [place] [address] [name] of [place] Phone: [phone] Relation: Daughter  Code Status:  DNR Goals of care: Advanced Directive information Advanced Directives 08/01/2016  Does Patient Have a Medical Advance Directive? Yes  Type of Paramedic of [place];Living will  Does patient want to make changes to medical advance directive? No - Patient declined  Copy of [place] in Chart? Yes     Chief Complaint  Patient presents with  . Acute Visit    shaking of different parts of his body, decreased responsiveness    HPI:  Pt is a 76 y.o. male seen today for an acute visit for shaking of different parts of his body and decreased level of consciousness.  Pt seen in recliner chair in memory care unit with twitching and jerking of extremities, face, even tongue.  He was arousable and actually stood up and walked--then seemed as clear as usual, able to speak and communicate, followed directions to put his shoes on.  Pt's daughter arrived and was concerned and I met with her.   Past Medical History:  Diagnosis Date  . Dementia   . Diabetes ([place])   . Hyperlipidemia   . Hypotension    Past Surgical History:  Procedure Laterality Date  . APPENDECTOMY  1961  . TONSILLECTOMY AND ADENOIDECTOMY    . TOTAL KNEE ARTHROPLASTY  2012    Allergies  Allergen Reactions  . Lortab [Hydrocodone-Acetaminophen] Other (See Comments)    Reaction:  Affected pts cognition   . Morphine And Related Other (See Comments)    Reaction:  Affected pts cognition     Allergies as of 08/08/2016      Reactions   Lortab  [hydrocodone-acetaminophen] Other (See Comments)   Reaction:  Affected pts cognition    Morphine And Related Other (See Comments)   Reaction:  Affected pts cognition       Medication List       Accurate as of 08/08/16 10:38 AM. Always use your most recent med list.          acetaminophen 650 MG CR tablet Commonly known as:  TYLENOL Take 650 mg by mouth daily.   alfuzosin 10 MG 24 hr tablet Commonly known as:  UROXATRAL Take 10 mg by mouth at bedtime.   allopurinol 100 MG tablet Commonly known as:  ZYLOPRIM Take 1 tablet (100 mg total) by mouth daily.   aspirin EC 81 MG tablet Take 81 mg by mouth daily.   atorvastatin 80 MG tablet Commonly known as:  LIPITOR Take 1 tablet (80 mg total) by mouth daily.   clopidogrel 75 MG tablet Commonly known as:  PLAVIX Take 1 tablet (75 mg total) by mouth daily.   donepezil 10 MG tablet Commonly known as:  ARICEPT Take 1 tablet (10 mg total) by mouth at bedtime.   gabapentin 100 MG capsule Commonly known as:  NEURONTIN Take 200 mg by mouth at bedtime.   guaiFENesin 600 MG 12 hr tablet Commonly known as:  MUCINEX Take 2 tablets (1,200 mg total) by mouth 2 (two) times daily.   ibuprofen 600 MG tablet Commonly known as:  ADVIL,MOTRIN Take 1 tablet (600 mg total) by mouth every 8 (eight) hours as needed for fever or moderate pain.   memantine 10 MG tablet Commonly known as:  NAMENDA Take 1 tablet (10 mg total) by mouth 2 (two) times daily.   metFORMIN 500 MG tablet Commonly known as:  GLUCOPHAGE Take 1 tablet (500 mg total) by mouth 2 (two) times daily with a meal.   MYRBETRIQ 50 MG Tb24 tablet Generic drug:  mirabegron ER Take 50 mg by mouth daily.   nitroGLYCERIN 0.4 MG SL tablet Commonly known as:  NITROSTAT Place 0.4 mg under the tongue every 5 (five) minutes as needed for chest pain.   vitamin B-12 1000 MCG tablet Commonly known as:  CYANOCOBALAMIN Take 1 tablet (1,000 mcg total) by mouth daily.       Review  of Systems  Constitutional: Negative for chills, fever and malaise/fatigue.  Respiratory: Negative for shortness of breath.   Cardiovascular: Negative for chest pain and palpitations.  Gastrointestinal: Negative for abdominal pain, blood in stool, constipation and melena.  Genitourinary: Positive for frequency. Negative for dysuria.  Musculoskeletal: Negative for falls.  Neurological: Negative for dizziness, tingling, tremors, sensory change, speech change, focal weakness, seizures, loss of consciousness, weakness and headaches.       Jerking of different extremities, face, tongue  Endo/Heme/Allergies: Bruises/bleeds easily.  Psychiatric/Behavioral: Positive for memory loss.    Immunization History  Administered Date(s) Administered  . Influenza-Unspecified 05/05/2010, 04/11/2013, 03/03/2015  . Pneumococcal Polysaccharide-23 12/25/2010  . Pneumococcal-Unspecified 02/28/2013  . Tdap 03/22/2006  . Zoster 11/01/2012   Pertinent  Health Maintenance Due  Topic Date Due  . FOOT EXAM  01/15/1950  . OPHTHALMOLOGY EXAM  01/15/1950  . URINE MICROALBUMIN  01/15/1950  . COLONOSCOPY  07/31/2013  . PNA vac Low Risk Adult (2 of 2 - PCV13) 02/28/2014  . INFLUENZA VACCINE  02/01/2016  . HEMOGLOBIN A1C  01/08/2017   Fall Risk  04/05/2016  Falls in the past year? Yes  Number falls in past yr: 1  Injury with Fall? Yes   Functional Status Survey:    There were no vitals filed for this visit. There is no height or weight on file to calculate BMI. Physical Exam  Constitutional: He appears well-developed and well-nourished.  Cardiovascular: Normal rate, regular rhythm, normal heart sounds and intact distal pulses.   Pulmonary/Chest: Effort normal and breath sounds normal. No respiratory distress.  Musculoskeletal: Normal range of motion.  Once aroused, ambulated as usual  Neurological:  Sleepy when I approached, but easily aroused, have myoclonic jerks of extremities, face, and even tongue    Skin: Skin is warm and dry.  Was feeling cold and had his jacket on, but not diaphoretic or febrile    Labs reviewed:  Recent Labs  07/11/16 07/27/16 1043 07/28/16 0455  NA 140 137 135  K 5.1 5.1 4.3  CL  --  102 102  CO2  --  26 27  GLUCOSE  --  160* 111*  BUN 51* 25* 25*  CREATININE 1.4* 1.09 1.09  CALCIUM  --  9.5 9.3    Recent Labs  07/11/16  AST 14  ALT 16  ALKPHOS 117    Recent Labs  07/11/16 07/27/16 1043 07/28/16 0455  WBC 7.5 11.1* 8.5  HGB 12.2* 10.7* 10.4*  HCT 37* 32.3* 31.5*  MCV  --  93.4 93.5  PLT 197 186 192   No results found for: TSH Lab Results  Component Value Date   HGBA1C 8.1  07/11/2016   Lab Results  Component Value Date   CHOL 149 07/11/2016   HDL 63 07/11/2016   LDLCALC 63 07/11/2016   TRIG 116 07/11/2016    Significant Diagnostic Results in last 30 days:  Dg Chest 2 View  Result Date: 07/27/2016 CLINICAL DATA:  76 year old male with a history of chest pain EXAM: CHEST  2 VIEW COMPARISON:  None. FINDINGS: Apical lordotic positioning. Borderline enlarged heart.  Calcifications of the aortic arch. No central vascular congestion. No pneumothorax or pleural effusion. No confluent airspace disease. Linear opacities at the base on lateral view may reflect atelectasis/ scarring. Nodule of the right upper lung measuring 8 mm-9 mm. No displaced fracture. IMPRESSION: No radiographic evidence of acute cardiopulmonary disease. Nodule of the right upper lung. Referral for pulmonary evaluation and chest CT is recommend, given the apparent lung nodule and that CT lung cancer screening is recommended for patients who are 78-27 years of age with a 30+ pack-year history of smoking, and who are currently smoking or quit <=15 years ago. Signed, Dulcy Fanny. Earleen Newport, DO [facility] Radiology Electronically Signed   By: Corrie Mckusick D.O.   On: 07/27/2016 11:26    Assessment/Plan 1. Myoclonic jerking while  sleeping -check cbc, bmp and monitor vitals -only med change lately was zoloft so d/c it and monitor -if persists, would send for EEG -his daughter had requested a neuro visit anyway for clarity on his type of dementia (said to be frontotemporal in the past but does not seem typical)  Family/ staff Communication: discussed with his daughter, memory care nurse, manager  Labs/tests ordered:  Cbc, bmp, neuro referral  [place] L. Necie Wilcoxson, D.O. [facility] [address], [place] [address] Cell Phone (Mon-Fri 8am-5pm):  [phone] On Call:  [phone] & follow prompts after 5pm & weekends Office Phone:  [phone] Office Fax:  [phone]

## 2016-08-11 ENCOUNTER — Encounter: Payer: Self-pay | Admitting: Internal Medicine

## 2016-08-21 ENCOUNTER — Non-Acute Institutional Stay (SKILLED_NURSING_FACILITY): Payer: PPO | Admitting: Adult Health

## 2016-08-21 ENCOUNTER — Encounter: Payer: Self-pay | Admitting: Adult Health

## 2016-08-21 DIAGNOSIS — R600 Localized edema: Secondary | ICD-10-CM | POA: Diagnosis not present

## 2016-08-21 DIAGNOSIS — E119 Type 2 diabetes mellitus without complications: Secondary | ICD-10-CM

## 2016-08-21 DIAGNOSIS — R197 Diarrhea, unspecified: Secondary | ICD-10-CM | POA: Diagnosis not present

## 2016-08-21 DIAGNOSIS — I10 Essential (primary) hypertension: Secondary | ICD-10-CM | POA: Diagnosis not present

## 2016-08-21 NOTE — Progress Notes (Signed)
Location:  Occupational psychologist of Service:  SNF (31) Provider:   Cindi Carbon, ANP [place] [phone]   REED, Jonelle Sidle, DO  Patient Care Team: [name], DO as PCP - General (Geriatric Medicine)  Extended Emergency Contact Information Primary Emergency Contact: Alley,Kelly Address: [address]          [place], [place] [address] [name] of [place] Phone: [phone] Relation: Daughter   Chief Complaint  Patient presents with  . Acute Visit    edema    HPI:  Pt is a 76 y.o. male seen today for an acute visit for edema. He has gained 4 lbs in the past month.  He was going to the funeral of his late wife today when his family noticed edema in his legs, worse on the right.  Through the day this improved. He has not had any sob or chest pain.  No calf pain or redness. VS are stable.  His BP has been running soft since he moved to memory care due to progressive dementia (his wife was his caretaker prior to her death).  He is off lisinopril.  No echo for review. He has a hx of CAD with stenting, on plavix, asa, and a statin.   He reports diarrhea to me today but due to his dementia he can not quantify it.  The staff has not noticed anything. He is on metformin. Morning CBGS running 120-130 range.   Past Medical History:  Diagnosis Date  . Dementia   . Diabetes ([place])   . Hyperlipidemia   . Hypotension    Past Surgical History:  Procedure Laterality Date  . APPENDECTOMY  1961  . TONSILLECTOMY AND ADENOIDECTOMY    . TOTAL KNEE ARTHROPLASTY  2012    Allergies  Allergen Reactions  . Lortab [Hydrocodone-Acetaminophen] Other (See Comments)    Reaction:  Affected pts cognition   . Morphine And Related Other (See Comments)    Reaction:  Affected pts cognition     Allergies as of 08/21/2016      Reactions   Lortab [hydrocodone-acetaminophen] Other (See Comments)   Reaction:  Affected pts cognition    Morphine And Related  Other (See Comments)   Reaction:  Affected pts cognition       Medication List       Accurate as of 08/21/16  3:27 PM. Always use your most recent med list.          acetaminophen 650 MG CR tablet Commonly known as:  TYLENOL Take 650 mg by mouth daily.   alfuzosin 10 MG 24 hr tablet Commonly known as:  UROXATRAL Take 10 mg by mouth at bedtime.   allopurinol 100 MG tablet Commonly known as:  ZYLOPRIM Take 1 tablet (100 mg total) by mouth daily.   aspirin EC 81 MG tablet Take 81 mg by mouth daily.   atorvastatin 80 MG tablet Commonly known as:  LIPITOR Take 1 tablet (80 mg total) by mouth daily.   clopidogrel 75 MG tablet Commonly known as:  PLAVIX Take 1 tablet (75 mg total) by mouth daily.   gabapentin 100 MG capsule Commonly known as:  NEURONTIN Take 200 mg by mouth at bedtime.   guaiFENesin 600 MG 12 hr tablet Commonly known as:  MUCINEX Take 2 tablets (1,200 mg total) by mouth 2 (two) times daily.   metFORMIN 500 MG tablet Commonly known as:  GLUCOPHAGE Take 1 tablet (500 mg total) by mouth 2 (  two) times daily with a meal.   MYRBETRIQ 50 MG Tb24 tablet Generic drug:  mirabegron ER Take 50 mg by mouth daily.   NAMZARIC 28-10 MG Cp24 Generic drug:  Memantine HCl-Donepezil HCl Take 1 capsule by mouth daily.   nitroGLYCERIN 0.4 MG SL tablet Commonly known as:  NITROSTAT Place 0.4 mg under the tongue every 5 (five) minutes as needed for chest pain.   vitamin B-12 1000 MCG tablet Commonly known as:  CYANOCOBALAMIN Take 1 tablet (1,000 mcg total) by mouth daily.       Review of Systems  Constitutional: Negative for activity change, appetite change, chills, diaphoresis, fatigue, fever and unexpected weight change.  HENT: Negative for congestion.   Respiratory: Negative for cough, shortness of breath, wheezing and stridor.   Cardiovascular: Positive for leg swelling. Negative for chest pain and palpitations.  Gastrointestinal: Positive for diarrhea.  Negative for abdominal distention, abdominal pain, anal bleeding, blood in stool, constipation and vomiting.  Genitourinary: Negative for difficulty urinating and dysuria.  Musculoskeletal: Negative for arthralgias, back pain, gait problem, joint swelling and myalgias.  Neurological: Negative for dizziness, seizures, syncope, facial asymmetry, speech difficulty, weakness and headaches.  Hematological: Negative for adenopathy. Does not bruise/bleed easily.  Psychiatric/Behavioral: Positive for confusion and dysphoric mood. Negative for agitation and behavioral problems.    Immunization History  Administered Date(s) Administered  . Influenza-Unspecified 05/05/2010, 04/11/2013, 03/03/2015  . Pneumococcal Polysaccharide-23 12/25/2010  . Pneumococcal-Unspecified 02/28/2013  . Tdap 03/22/2006  . Zoster 11/01/2012   Pertinent  Health Maintenance Due  Topic Date Due  . FOOT EXAM  01/15/1951  . OPHTHALMOLOGY EXAM  01/15/1951  . URINE MICROALBUMIN  01/15/1951  . COLONOSCOPY  07/31/2013  . PNA vac Low Risk Adult (2 of 2 - PCV13) 02/28/2014  . INFLUENZA VACCINE  02/01/2016  . HEMOGLOBIN A1C  01/08/2017   Fall Risk  04/05/2016  Falls in the past year? Yes  Number falls in past yr: 1  Injury with Fall? Yes   Functional Status Survey:    Vitals:   08/21/16 1525  BP: (!) 91/58  Pulse: 62  Resp: 18  Temp: 97.3 F (36.3 C)  SpO2: 98%  Weight: 164 lb 8 oz (74.6 kg)   Body mass index is 27.37 kg/m. Physical Exam  Constitutional: No distress.  HENT:  Head: Normocephalic and atraumatic.  Neck: Normal range of motion. Neck supple. No JVD present. No tracheal deviation present. No thyromegaly present.  Cardiovascular: Normal rate and regular rhythm.   No murmur heard. BLE edema +1 on the left, trace on the right. No calf tenderness or redness.   Pulmonary/Chest: Effort normal and breath sounds normal. No respiratory distress. He has no wheezes.  Abdominal: Soft. Bowel sounds are normal.  He exhibits no distension. There is no tenderness.  Lymphadenopathy:    He has no cervical adenopathy.  Neurological: He is alert.  Oriented to self and place. Not time or situation.  Skin: Skin is warm and dry. He is not diaphoretic.  Psychiatric: He has a normal mood and affect.    Labs reviewed:  Recent Labs  07/11/16 07/27/16 1043 07/28/16 0455  NA 140 137 135  K 5.1 5.1 4.3  CL  --  102 102  CO2  --  26 27  GLUCOSE  --  160* 111*  BUN 51* 25* 25*  CREATININE 1.4* 1.09 1.09  CALCIUM  --  9.5 9.3    Recent Labs  07/11/16  AST 14  ALT 16  ALKPHOS 117  Recent Labs  07/11/16 07/27/16 1043 07/28/16 0455  WBC 7.5 11.1* 8.5  HGB 12.2* 10.7* 10.4*  HCT 37* 32.3* 31.5*  MCV  --  93.4 93.5  PLT 197 186 192   No results found for: TSH Lab Results  Component Value Date   HGBA1C 8.1 07/11/2016   Lab Results  Component Value Date   CHOL 149 07/11/2016   HDL 63 07/11/2016   LDLCALC 63 07/11/2016   TRIG 116 07/11/2016    Significant Diagnostic Results in last 30 days:  Dg Chest 2 View  Result Date: 07/27/2016 CLINICAL DATA:  76 year old male with a history of chest pain EXAM: CHEST  2 VIEW COMPARISON:  None. FINDINGS: Apical lordotic positioning. Borderline enlarged heart.  Calcifications of the aortic arch. No central vascular congestion. No pneumothorax or pleural effusion. No confluent airspace disease. Linear opacities at the base on lateral view may reflect atelectasis/ scarring. Nodule of the right upper lung measuring 8 mm-9 mm. No displaced fracture. IMPRESSION: No radiographic evidence of acute cardiopulmonary disease. Nodule of the right upper lung. Referral for pulmonary evaluation and chest CT is recommend, given the apparent lung nodule and that CT lung cancer screening is recommended for patients who are 63-6 years of age with a 30+ pack-year history of smoking, and who are currently smoking or quit <=15 years ago. Signed, Dulcy Fanny. Earleen Newport, DO [facility] Electronically Signed   By: Corrie Mckusick D.O.   On: 07/27/2016 11:26    Assessment/Plan  1) Edema 4 lb gain with mild ankle edema, no sob or chest pain Ordered compression hose on in the am off in the pm Monitor VS with 02 sat qd x 7 days Weight three times weekly Check CBC and CMP (?albumin level) Monitor for shortness of breath  If worsening would order echo, rather mild now  2) HTN Has been off lisinopril, BP soft would avoid diuretics  3. Diarrhea, unspecified type Not sure if this is an issue or not Will have staff monitor BM log for a week and report to [facility] ?meformin as a cause  4. Diabetes mellitus type 2 without retinopathy ([place]) A1C above goal Needs CBGs more frequently, may have afternoon highs Will order cbg log and then address    Family/ staff Communication: discussed with his family and nurse  Labs/tests ordered:  CBC BMP ordered

## 2016-08-22 LAB — BASIC METABOLIC PANEL
BUN: 22 mg/dL — AB (ref 4–21)
Creatinine: 1 mg/dL (ref 0.6–1.3)
Glucose: 210 mg/dL
Potassium: 4.1 mmol/L (ref 3.4–5.3)
SODIUM: 145 mmol/L (ref 137–147)

## 2016-08-22 LAB — CBC AND DIFFERENTIAL
HEMATOCRIT: 34 % — AB (ref 41–53)
HEMOGLOBIN: 11 g/dL — AB (ref 13.5–17.5)
Platelets: 203 10*3/uL (ref 150–399)
WBC: 6.9 10^3/mL

## 2016-08-22 LAB — HEPATIC FUNCTION PANEL
ALT: 14 U/L (ref 10–40)
AST: 12 U/L — AB (ref 14–40)
Alkaline Phosphatase: 103 U/L (ref 25–125)
Bilirubin, Total: 0.3 mg/dL

## 2016-08-29 NOTE — Addendum Note (Signed)
Addended by: [name] on: 08/29/2016 09:57 AM   Modules accepted: Level of Service

## 2016-08-30 ENCOUNTER — Ambulatory Visit (INDEPENDENT_AMBULATORY_CARE_PROVIDER_SITE_OTHER): Payer: PPO | Admitting: Neurology

## 2016-08-30 ENCOUNTER — Encounter: Payer: Self-pay | Admitting: Neurology

## 2016-08-30 VITALS — BP 122/82 | HR 67 | Ht 65.0 in | Wt 170.2 lb

## 2016-08-30 DIAGNOSIS — G3101 Pick's disease: Secondary | ICD-10-CM

## 2016-08-30 DIAGNOSIS — F0390 Unspecified dementia without behavioral disturbance: Secondary | ICD-10-CM | POA: Diagnosis not present

## 2016-08-30 DIAGNOSIS — F0280 Dementia in other diseases classified elsewhere without behavioral disturbance: Secondary | ICD-10-CM | POA: Diagnosis not present

## 2016-08-30 NOTE — Patient Instructions (Addendum)
Remember to drink plenty of fluid, eat healthy meals and do not skip any meals. Try to eat protein with a every meal and eat a healthy snack such as fruit or nuts in between meals. Try to keep a regular sleep-wake schedule and try to exercise daily, particularly in the form of walking, 20-30 minutes a day, if you can.   As far as your medications are concerned, I would like to suggest: Continue current medications  As far as diagnostic testing: Will review records  I would like to see you back in 6 months, sooner if we need to. Please call us with any interim questions, concerns, problems, updates or refill requests.    Our phone number is [phone]. We also have an after hours call service for urgent matters and there is a physician on-call for urgent questions. For any emergencies you know to call 911 or go to the nearest emergency room  Essential Tremor A tremor is trembling or shaking that you cannot control. Most tremors affect the hands or arms. Tremors can also affect the head, vocal cords, face, and other parts of the body. Essential tremor is a tremor without a known cause. What are the causes? Essential tremor has no known cause. What increases the risk? You may be at greater risk of essential tremor if:  You have a family member with essential tremor.  You are age 75 or older.  You take certain medicines. What are the signs or symptoms? The main sign of a tremor is uncontrolled and unintentional rhythmic shaking of a body part.  You may have difficulty eating with a spoon or fork.  You may have difficulty writing.  You may nod your head up and down or side to side.  You may have a quivering voice. Your tremors:  May get worse over time.  May come and go.  May be more noticeable on one side of your body.  May get worse due to stress, fatigue, caffeine, and extreme heat or cold. How is this diagnosed? Your health care provider can diagnose essential tremor based  on your symptoms, medical history, and a physical examination. There is no single test to diagnose an essential tremor. However, your health care provider may perform a variety of tests to rule out other conditions. Tests may include:  Blood and urine tests.  Imaging studies of your brain, such as:  CT scan.  MRI.  A test that measures involuntary muscle movement (electromyogram). How is this treated? Your tremors may go away without treatment. Mild tremors may not need treatment if they do not affect your day-to-day life. Severe tremors may need to be treated using one or a combination of the following options:  Medicines. This may include medicine that is injected.  Lifestyle changes.  Physical therapy. Follow these instructions at home:  Take medicines only as directed by your health care provider.  Limit alcohol intake to no more than 1 drink per day for nonpregnant women and 2 drinks per day for men. One drink equals 12 oz of beer, 5 oz of wine, or 1 oz of hard liquor.  Do not use any tobacco products, including cigarettes, chewing tobacco, or electronic cigarettes. If you need help quitting, ask your health care provider.  Take medicines only as directed by your health care provider.  Avoid extreme heat or cold.  Limit the amount of caffeine you consumeas directed by your health care provider.  Try to get eight hours of sleep each night.  Find ways to manage your stress, such as meditation or yoga.  Keep all follow-up visits as directed by your health care provider. This is important. This includes any physical therapy visits. Contact a health care provider if:  You experience any changes in the location or intensity of your tremors.  You start having a tremor after starting a new medicine.  You have tremor with other symptoms such as:  Numbness.  Tingling.  Pain.  Weakness.  Your tremor gets worse.  Your tremor interferes with your daily life. This  information is not intended to replace advice given to you by your health care provider. Make sure you discuss any questions you have with your health care provider. Document Released: 07/10/2014 Document Revised: 11/25/2015 Document Reviewed: 12/15/2013 Elsevier Interactive Patient Education  2017 [employer].

## 2016-08-30 NOTE — Progress Notes (Addendum)
[facility]    Provider:  Dr Jaynee Eagles Referring Provider: [name], DO Primary Care Physician:  Hollace Kinnier, DO  CC:  Dementia  HPI:  Jose Castillo is a 76 y.o. male here as a referral from Dr. Mariea Clonts for memory loss. 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 He has memory problems. Here with daughter who provides most information. Having trouble getting words out. Started in 2013. Slowly progressed. He gets lost in the car so doesn;t drive anymore. Wife passed away a month ago. After knee replacement in 2013 memory changes were noted significantly however may have been going on longer. They live in [place]. Daughter says he was seeing a neurologist previously. He was diagnosed with frontotemporal dementia. No personality changes. Patient is very gentle. He has significant speaking troubles. Patient doesn;t remember conversations. Long-term memory is better. He is in the memory unit. They were both 76 when they died. No memory issues. He was a Psychologist, educational. Lots of concussions and "blows to the head". He is on Namzeric. There is some depression. No other focal neurologic deficits, associated symptoms, inciting events or modifiable factors.  Reviewed notes, labs and imaging from outside physicians, which showed:  hgba1c 8.1, BUN 5.1, creatinine 1.4 (07/11/2016)  Reviewed notes. Patient has been diagnosed with frontotemporal dementia, previous neurologist.  He wakes up and wanders around the house. He has difficulty with the locks and locks himself out. On Aricept and Namenda. No hallucinations and delusions worries he will get lost. Does have Word finding difficulties. Partner has to help him with his medications. Needs help with ADLs such as  Showering and dressing. He has only fallen once. Does have a tremor. There is some depression related to lack of ability to move. Has to think about it to put  his pants on.    Review of Systems: Patient complains of symptoms per HPI as well as the following symptoms: memory loss. Pertinent negatives per HPI. All others negative.   Social History   Social History  . Marital status: Widowed    Spouse name: N/A  . Number of children: 3  . Years of education: MBA   Occupational History  . N/A    Social History Main Topics  . Smoking status: Former Smoker    Packs/day: 0.50    Years: 20.00    Types: Cigarettes    Quit date: 2003  . Smokeless tobacco: Never Used  . Alcohol use No     Comment: Quit 2013  . Drug use: No  . Sexual activity: No   Other Topics Concern  . Not on file   Social History Narrative   Right-handed      Do you drink/eat things with caffeine? occasional diet Coke      Marital status?  widowed                        What year were you married?  1966      Do you live in a house, apartment, assisted living, condo, trailer, etc.? [place]      Is it one or more stories? one      How many persons live in your home?      Do you have any pets in your home? (please list) none      Current or past profession: financial      Do you exercise?   yes  Type & how often? 1/day      Do you have a living will? yes      Do you have a DNR form?                                  If not, do you want to discuss one?      Do you have signed POA/HPOA for forms?        Family History  Problem Relation Age of Onset  . Cancer Mother 54    Lymphoma  . Heart attack Father 62  . Diabetes Sister   . Brain cancer Sister     Past Medical History:  Diagnosis Date  . Dementia   . Diabetes ([place])   . Heart disease   . Hyperlipidemia   . Hypotension     Past Surgical History:  Procedure Laterality Date  . APPENDECTOMY  1961  . CORONARY ANGIOPLASTY WITH STENT PLACEMENT  2016  . TONSILLECTOMY AND ADENOIDECTOMY    . TOTAL KNEE ARTHROPLASTY  2012    Current Outpatient  Prescriptions  Medication Sig Dispense Refill  . acetaminophen (TYLENOL) 650 MG CR tablet Take 650 mg by mouth daily.    Marland Kitchen alfuzosin (UROXATRAL) 10 MG 24 hr tablet Take 10 mg by mouth at bedtime.    Marland Kitchen allopurinol (ZYLOPRIM) 100 MG tablet Take 1 tablet (100 mg total) by mouth daily. 30 tablet 3  . aspirin EC 81 MG tablet Take 81 mg by mouth daily.    Marland Kitchen atorvastatin (LIPITOR) 80 MG tablet Take 1 tablet (80 mg total) by mouth daily. 30 tablet 3  . clopidogrel (PLAVIX) 75 MG tablet Take 1 tablet (75 mg total) by mouth daily. 30 tablet 3  . gabapentin (NEURONTIN) 100 MG capsule Take 200 mg by mouth at bedtime.    Marland Kitchen guaiFENesin (MUCINEX) 600 MG 12 hr tablet Take 2 tablets (1,200 mg total) by mouth 2 (two) times daily. 10 tablet 0  . Memantine HCl-Donepezil HCl (NAMZARIC) 28-10 MG CP24 Take 1 capsule by mouth daily.    . metFORMIN (GLUCOPHAGE) 500 MG tablet Take 1 tablet (500 mg total) by mouth 2 (two) times daily with a meal. 60 tablet 3  . mirabegron ER (MYRBETRIQ) 50 MG TB24 tablet Take 50 mg by mouth daily.    . nitroGLYCERIN (NITROSTAT) 0.4 MG SL tablet Place 0.4 mg under the tongue every 5 (five) minutes as needed for chest pain.    . vitamin B-12 (CYANOCOBALAMIN) 1000 MCG tablet Take 1 tablet (1,000 mcg total) by mouth daily. 30 tablet 3   No current facility-administered medications for this visit.     Allergies as of 08/30/2016 - Review Complete 08/21/2016  Allergen Reaction Noted  . Lortab [hydrocodone-acetaminophen] Other (See Comments) 03/02/2016  . Morphine and related Other (See Comments) 03/02/2016    Vitals: BP 122/82   Pulse 67   Ht 5\' 5"  (1.651 m)   Wt 170 lb 3.2 oz (77.2 kg)   BMI 28.32 kg/m  Last Weight:  Wt Readings from Last 1 Encounters:  08/30/16 170 lb 3.2 oz (77.2 kg)   Last Height:   Ht Readings from Last 1 Encounters:  08/30/16 5\' 5"  (1.651 m)   Physical exam: Exam: Gen: NAD, mildly conversant, well nourised, well groomed                     CV: no MRG.  No  Carotid Bruits. No peripheral edema, warm, nontender Eyes: Conjunctivae clear without exudates or hemorrhage  Neuro: Detailed Neurologic Exam  Speech:    Speech with mild aphasia.  Cognition:      The patient is oriented to person, place, and time;     recent and remote memory impaired;     language fluent;     Impaired attention, concentration, fund of knowledge Cranial Nerves:    Left irregular pupil. The right pupil round, and reactive to light. The fundi are normal and spontaneous venous pulsations are present. Visual fields are full to finger confrontation. Impaired upgaze. Trigeminal sensation is intact and the muscles of mastication are normal. The face is symmetric. The palate elevates in the midline. Hearing intact. Voice is normal. Shoulder shrug is normal. The tongue has normal motion without fasciculations.   Coordination:    Normal finger to nose and heel to shin. Normal rapid alternating movements.   Gait: Slightly wide based  Motor Observation:    No asymmetry, no atrophy, and no involuntary movements noted. Tone:    Increased tone in all extremities   Posture:    Posture is normal. normal erect    Strength:    Strength is V/V in the upper and lower limbs.      Sensation: intact to LT     Reflex Exam:  DTR's:    Deep tendon reflexes in the upper and lower extremities are symmetrical bilaterally.   Toes:    The toes are downgoing bilaterally.   Clonus:    Clonus is absent.    Assessment/Plan:  76 year old with frontotemporal dementia. Will review records from previous neurologist before any new orders. Continue Namzeric. 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 Gave information on essential tremor and FTD. MMSE 19/30.  Cc: REED, TIFFANY, DO  [facility], [facility] [address] [place], [place] [address]  Phone [phone] Fax [phone]

## 2016-09-04 ENCOUNTER — Encounter: Payer: Self-pay | Admitting: Adult Health

## 2016-09-04 ENCOUNTER — Non-Acute Institutional Stay (SKILLED_NURSING_FACILITY): Payer: PPO | Admitting: Adult Health

## 2016-09-04 DIAGNOSIS — E1142 Type 2 diabetes mellitus with diabetic polyneuropathy: Secondary | ICD-10-CM | POA: Diagnosis not present

## 2016-09-04 DIAGNOSIS — G250 Essential tremor: Secondary | ICD-10-CM

## 2016-09-04 DIAGNOSIS — F0393 Unspecified dementia, unspecified severity, with mood disturbance: Secondary | ICD-10-CM | POA: Insufficient documentation

## 2016-09-04 DIAGNOSIS — F0280 Dementia in other diseases classified elsewhere without behavioral disturbance: Secondary | ICD-10-CM | POA: Diagnosis not present

## 2016-09-04 DIAGNOSIS — F329 Major depressive disorder, single episode, unspecified: Secondary | ICD-10-CM

## 2016-09-04 DIAGNOSIS — G3109 Other frontotemporal dementia: Secondary | ICD-10-CM | POA: Insufficient documentation

## 2016-09-04 DIAGNOSIS — R635 Abnormal weight gain: Secondary | ICD-10-CM | POA: Diagnosis not present

## 2016-09-04 DIAGNOSIS — G1220 Motor neuron disease, unspecified: Secondary | ICD-10-CM

## 2016-09-04 HISTORY — DX: Other frontotemporal neurocognitive disorder: G31.09

## 2016-09-04 HISTORY — DX: Dementia in other diseases classified elsewhere, unspecified severity, without behavioral disturbance, psychotic disturbance, mood disturbance, and anxiety: F02.80

## 2016-09-04 HISTORY — DX: Essential tremor: G25.0

## 2016-09-04 NOTE — Progress Notes (Signed)
Location:  Occupational psychologist of Service:  SNF (31) Provider:   Cindi Carbon, ANP [place] [phone]   REED, Jose Sidle, Jose Castillo  Patient Care Team: [name], Jose Castillo as PCP - General (Geriatric Medicine)  Extended Emergency Contact Information Primary Emergency Contact: Alley,Kelly Address: [address]          [place], [place] [address] [name] of [place] Phone: [phone] Relation: Daughter  Code Status:  Full code Goals of care: Advanced Directive information Advanced Directives 08/01/2016  Does Patient Have a Medical Advance Directive? Yes  Type of Paramedic of [place];Living will  Does patient want to make changes to medical advance directive? No - Patient declined  Copy of [place] in Chart? Yes     Chief Complaint  Patient presents with  . Acute Visit    not sleeping, agitation  . Medical Management of Chronic Issues    HPI:  Pt is a 76 y.o. male seen today for reports of not sleeping and agitation, as well as medical management of chronic diseases.  He resides in memory care due to dementia, after the recent loss of his wife. Staff report he is not sleeping well at night and has agitation, irritability. The nurse reports ativan did not help in the past.  He was started on zoloft for depression but this was stopped after he developed a tremor/jerking symptom.  He saw Dr. 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 This has not seemed to effect his ADLs.   His fasting CBGs  This week are 132 and 151. Last A1C in Jan was 8.1.  He is on metformin He has gained 7 lbs since arriving to the memory care unit in Jan of 2018.  Staff report that he is a good eater. Compression hose were placed on his legs a couple of weeks ago due to increased edema and this has helped tremendously. He has not had any sob or chest  pain.     Past Medical History:  Diagnosis Date  . Dementia   . Diabetes ([place])   . Heart disease   . Hyperlipidemia   . Hypotension    Past Surgical History:  Procedure Laterality Date  . APPENDECTOMY  1961  . CORONARY ANGIOPLASTY WITH STENT PLACEMENT  2016  . TONSILLECTOMY AND ADENOIDECTOMY    . TOTAL KNEE ARTHROPLASTY  2012    Allergies  Allergen Reactions  . Lortab [Hydrocodone-Acetaminophen] Other (See Comments)    Reaction:  Affected pts cognition   . Morphine And Related Other (See Comments)    Reaction:  Affected pts cognition     Allergies as of 09/04/2016      Reactions   Lortab [hydrocodone-acetaminophen] Other (See Comments)   Reaction:  Affected pts cognition    Morphine And Related Other (See Comments)   Reaction:  Affected pts cognition       Medication List       Accurate as of 09/04/16 11:20 AM. Always use your most recent med list.          acetaminophen 650 MG CR tablet Commonly known as:  TYLENOL Take 650 mg by mouth daily.   alfuzosin 10 MG 24 hr tablet Commonly known as:  UROXATRAL Take 10 mg by mouth at bedtime.   allopurinol 100 MG tablet Commonly known as:  ZYLOPRIM Take 1 tablet (100 mg  total) by mouth daily.   aspirin EC 81 MG tablet Take 81 mg by mouth daily.   atorvastatin 80 MG tablet Commonly known as:  LIPITOR Take 1 tablet (80 mg total) by mouth daily.   clopidogrel 75 MG tablet Commonly known as:  PLAVIX Take 1 tablet (75 mg total) by mouth daily.   gabapentin 100 MG capsule Commonly known as:  NEURONTIN Take 200 mg by mouth at bedtime.   guaiFENesin 600 MG 12 hr tablet Commonly known as:  MUCINEX Take 2 tablets (1,200 mg total) by mouth 2 (two) times daily.   metFORMIN 500 MG tablet Commonly known as:  GLUCOPHAGE Take 1 tablet (500 mg total) by mouth 2 (two) times daily with a meal.   MYRBETRIQ 50 MG Tb24 tablet Generic drug:  mirabegron ER Take 50 mg by mouth daily.   NAMZARIC 28-10 MG Cp24 Generic drug:   Memantine HCl-Donepezil HCl Take 1 capsule by mouth daily.   nitroGLYCERIN 0.4 MG SL tablet Commonly known as:  NITROSTAT Place 0.4 mg under the tongue every 5 (five) minutes as needed for chest pain.   vitamin B-12 1000 MCG tablet Commonly known as:  CYANOCOBALAMIN Take 1 tablet (1,000 mcg total) by mouth daily.       Review of Systems  Constitutional: Positive for unexpected weight change. Negative for activity change, appetite change, chills, diaphoresis, fatigue and fever.  Respiratory: Negative for cough, shortness of breath, wheezing and stridor.   Cardiovascular: Positive for leg swelling. Negative for chest pain and palpitations.  Gastrointestinal: Negative for abdominal distention, abdominal pain, constipation and diarrhea.  Genitourinary: Negative for difficulty urinating and dysuria.  Musculoskeletal: Positive for gait problem (slight shuffle). Negative for arthralgias, back pain, joint swelling and myalgias.  Neurological: Negative for dizziness, seizures, syncope, facial asymmetry, speech difficulty, weakness and headaches.  Hematological: Negative for adenopathy. Does not bruise/bleed easily.  Psychiatric/Behavioral: Positive for agitation, confusion and sleep disturbance. Negative for behavioral problems, decreased concentration and dysphoric mood. The patient is nervous/anxious.     Immunization History  Administered Date(s) Administered  . Influenza-Unspecified 05/05/2010, 04/11/2013, 03/03/2015  . Pneumococcal Polysaccharide-23 12/25/2010  . Pneumococcal-Unspecified 02/28/2013  . Tdap 03/22/2006  . Zoster 11/01/2012   Pertinent  Health Maintenance Due  Topic Date Due  . FOOT EXAM  01/15/1951  . OPHTHALMOLOGY EXAM  01/15/1951  . URINE MICROALBUMIN  01/15/1951  . COLONOSCOPY  07/31/2013  . PNA vac Low Risk Adult (2 of 2 - PCV13) 02/28/2014  . INFLUENZA VACCINE  02/01/2016  . HEMOGLOBIN A1C  01/08/2017   Fall Risk  04/05/2016  Falls in the past year? Yes    Number falls in past yr: 1  Injury with Fall? Yes   Functional Status Survey:    Vitals:   09/04/16 1115  BP: 118/70  Pulse: 60  Resp: 18  Temp: 97.6 F (36.4 C)  SpO2: 96%  Weight: 167 lb 8 oz (76 kg)   Body mass index is 27.87 kg/m. Physical Exam  Constitutional: No distress.  HENT:  Head: Normocephalic and atraumatic.  Neck: Normal range of motion. Neck supple. No JVD present. No tracheal deviation present. No thyromegaly present.  Cardiovascular: Normal rate and regular rhythm.   No murmur heard. Trace edema bilat (improved with compression hose)  Pulmonary/Chest: Effort normal. No respiratory distress. He has no wheezes. He has rales (fine to both bases).  Abdominal: Soft. Bowel sounds are normal. He exhibits no distension. There is no tenderness.  Musculoskeletal:  Slight shuffle with ambulation.  Tremor noted to both hands  Lymphadenopathy:    He has no cervical adenopathy.  Neurological: He is alert. No cranial nerve deficit.  Oriented to self, has difficulty remember location or his situation.  Not oriented to time.   Skin: Skin is warm and dry. He is not diaphoretic.  Psychiatric: He has a normal mood and affect.    Labs reviewed:  Recent Labs  07/27/16 1043 07/28/16 0455 08/22/16  NA 137 135 145  K 5.1 4.3 4.1  CL 102 102  --   CO2 26 27  --   GLUCOSE 160* 111*  --   BUN 25* 25* 22*  CREATININE 1.09 1.09 1.0  CALCIUM 9.5 9.3  --     Recent Labs  07/11/16 08/22/16  AST 14 12*  ALT 16 14  ALKPHOS 117 103    Recent Labs  07/27/16 1043 07/28/16 0455 08/22/16  WBC 11.1* 8.5 6.9  HGB 10.7* 10.4* 11.0*  HCT 32.3* 31.5* 34*  MCV 93.4 93.5  --   PLT 186 192 203   No results found for: TSH Lab Results  Component Value Date   HGBA1C 8.1 07/11/2016   Lab Results  Component Value Date   CHOL 149 07/11/2016   HDL 63 07/11/2016   LDLCALC 63 07/11/2016   TRIG 116 07/11/2016    Significant Diagnostic Results in last 30 days:  No results  found.  Assessment/Plan 1. Diabetic peripheral neuropathy associated with type 2 diabetes mellitus (HCC) Needs A1C next visit Fasting cbg improved but gaining weight Continue neurontin, denies foot pain  2. Weight gain Continue heart healthy diet but reduce portion sizes If this continues further will check TSH Continue compression hose  3. Essential tremor Diagnosed by Dr. 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 Frontal dementia Confirmed by recent visit with Dr. Rexene Alberts Continue Namzaric  6. Insomnia Melatonin 5 mg qhs scheduled   Family/ staff Communication: discussed with staff  Labs/tests ordered:  NA

## 2016-09-19 DIAGNOSIS — N3941 Urge incontinence: Secondary | ICD-10-CM | POA: Diagnosis not present

## 2016-09-19 DIAGNOSIS — N401 Enlarged prostate with lower urinary tract symptoms: Secondary | ICD-10-CM | POA: Diagnosis not present

## 2016-09-19 DIAGNOSIS — R351 Nocturia: Secondary | ICD-10-CM | POA: Diagnosis not present

## 2016-10-14 LAB — MICROALBUMIN, URINE

## 2016-10-23 ENCOUNTER — Non-Acute Institutional Stay: Payer: PPO | Admitting: Adult Health

## 2016-10-23 ENCOUNTER — Encounter: Payer: Self-pay | Admitting: Adult Health

## 2016-10-23 DIAGNOSIS — E782 Mixed hyperlipidemia: Secondary | ICD-10-CM | POA: Diagnosis not present

## 2016-10-23 DIAGNOSIS — E118 Type 2 diabetes mellitus with unspecified complications: Secondary | ICD-10-CM | POA: Diagnosis not present

## 2016-10-23 DIAGNOSIS — R2681 Unsteadiness on feet: Secondary | ICD-10-CM

## 2016-10-23 DIAGNOSIS — E1165 Type 2 diabetes mellitus with hyperglycemia: Secondary | ICD-10-CM | POA: Diagnosis not present

## 2016-10-23 DIAGNOSIS — R351 Nocturia: Secondary | ICD-10-CM | POA: Diagnosis not present

## 2016-10-23 DIAGNOSIS — IMO0002 Reserved for concepts with insufficient information to code with codable children: Secondary | ICD-10-CM

## 2016-10-23 NOTE — Progress Notes (Addendum)
Location:  Occupational psychologist of Service:  ALF (13) Provider:   Cindi Carbon, ANP [place] [phone]  REED, Jonelle Sidle, DO  Patient Care Team: [name], DO as PCP - General (Geriatric Medicine)  Extended Emergency Contact Information Primary Emergency Contact: Alley,Kelly Address: [address]          [place], [place] [address] [name] of [place] Phone: [phone] Relation: Daughter  Code Status:  DNR Goals of care: Advanced Directive information Advanced Directives 10/23/2016  Does Patient Have a Medical Advance Directive? Yes  Type of Paramedic of [place];Living will;Out of facility DNR (pink MOST or yellow form)  Does patient want to make changes to medical advance directive? -  Copy of [place] in Chart? Yes     Chief Complaint  Patient presents with  . Acute Visit    fever, dysuria, frequency    HPI:  Pt is a 76 y.o. male seen today for an acute visit for dysuria, fever, unsteady gait, chills and difficulty voiding. These symptoms were noted on 4/21 and a form was filled out to alert [place] and handed to me 10/23/2016  He has a hx of BPH with LUTS and is followed by Urology. He was started on Mybetriq and follow up visit with [facility] is due in a few weeks.   Staff obtained a urine dip which was negative for leukocytes, nitrates, small amt of blood noted, and trace protein.  For my visit he denies any urinary symptoms and the staff has not noticed anything. His temp is now 99.1, was 100.3 axillary on 4/21.  I instructed the staff to obtain a urine via I and O cath. The nurse attempted after he voided and was not able to obtain a urine as she could not pass the catheter. The staff has been encouraging fluid and they report he is in his normal state of health, eating and drinking well. He has a mild cough but no nasal drainage, sputum production, etc. He reports he has a hx of  allergies and used to take "something" for it. He is on scheduled Mucinex which was started after a bout of bronchitis in Jan The staff also report his gait has been more unsteady over time and he had a fall without injury over the weekend. He has some involuntary jerks in his hands of unclear etiology, neurology felt they were likely essential.   Past Medical History:  Diagnosis Date  . Dementia   . Diabetes ([place])   . Heart disease   . Hyperlipidemia   . Hypotension    Past Surgical History:  Procedure Laterality Date  . APPENDECTOMY  1961  . CORONARY ANGIOPLASTY WITH STENT PLACEMENT  2016  . TONSILLECTOMY AND ADENOIDECTOMY    . TOTAL KNEE ARTHROPLASTY  2012    Allergies  Allergen Reactions  . Lortab [Hydrocodone-Acetaminophen] Other (See Comments)    Reaction:  Affected pts cognition   . Morphine And Related Other (See Comments)    Reaction:  Affected pts cognition     Outpatient Encounter Prescriptions as of 10/23/2016  Medication Sig  . acetaminophen (TYLENOL) 650 MG CR tablet Take 650 mg by mouth daily.  Marland Kitchen alfuzosin (UROXATRAL) 10 MG 24 hr tablet Take 10 mg by mouth at bedtime.  Marland Kitchen allopurinol (ZYLOPRIM) 100 MG tablet Take 1 tablet (100 mg total) by mouth daily.  Marland Kitchen aspirin EC 81 MG tablet Take 81 mg by mouth daily.  Marland Kitchen  atorvastatin (LIPITOR) 80 MG tablet Take 1 tablet (80 mg total) by mouth daily.  . clopidogrel (PLAVIX) 75 MG tablet Take 1 tablet (75 mg total) by mouth daily.  Marland Kitchen gabapentin (NEURONTIN) 100 MG capsule Take 200 mg by mouth at bedtime.  Marland Kitchen guaiFENesin (MUCINEX) 600 MG 12 hr tablet Take 2 tablets (1,200 mg total) by mouth 2 (two) times daily.  . Memantine HCl-Donepezil HCl (NAMZARIC) 28-10 MG CP24 Take 1 capsule by mouth daily.  . metFORMIN (GLUCOPHAGE) 500 MG tablet Take 1 tablet (500 mg total) by mouth 2 (two) times daily with a meal.  . mirabegron ER (MYRBETRIQ) 50 MG TB24 tablet Take 50 mg by mouth daily.  . nitroGLYCERIN (NITROSTAT) 0.4 MG SL tablet Place  0.4 mg under the tongue every 5 (five) minutes as needed for chest pain.  . vitamin B-12 (CYANOCOBALAMIN) 1000 MCG tablet Take 1 tablet (1,000 mcg total) by mouth daily.   No facility-administered encounter medications on file as of 10/23/2016.     Review of Systems  Constitutional: Negative for activity change, appetite change, chills, diaphoresis, fatigue, fever and unexpected weight change.  HENT: Negative for congestion, postnasal drip, rhinorrhea, sore throat, trouble swallowing and voice change.   Respiratory: Negative for cough, shortness of breath, wheezing and stridor.   Cardiovascular: Positive for leg swelling. Negative for chest pain and palpitations.  Gastrointestinal: Negative for abdominal distention, abdominal pain, constipation and diarrhea.  Genitourinary: Negative for difficulty urinating, discharge, dysuria, flank pain, frequency, hematuria, penile swelling, scrotal swelling and testicular pain.       Staff report frequency at night, resident does not remember this issue  Musculoskeletal: Positive for gait problem. Negative for arthralgias, back pain, joint swelling and myalgias.  Neurological: Positive for tremors and speech difficulty. Negative for dizziness, seizures, syncope, facial asymmetry, weakness and headaches.  Hematological: Negative for adenopathy. Does not bruise/bleed easily.  Psychiatric/Behavioral: Positive for confusion. Negative for agitation and behavioral problems.    Immunization History  Administered Date(s) Administered  . Influenza-Unspecified 05/05/2010, 04/11/2013, 03/03/2015  . Pneumococcal Polysaccharide-23 12/25/2010  . Pneumococcal-Unspecified 02/28/2013  . Tdap 03/22/2006  . Zoster 11/01/2012   Pertinent  Health Maintenance Due  Topic Date Due  . FOOT EXAM  01/15/1951  . OPHTHALMOLOGY EXAM  01/15/1951  . URINE MICROALBUMIN  01/15/1951  . COLONOSCOPY  07/31/2013  . PNA vac Low Risk Adult (2 of 2 - PCV13) 02/28/2014  . HEMOGLOBIN A1C   01/08/2017  . INFLUENZA VACCINE  01/31/2017   Fall Risk  04/05/2016  Falls in the past year? Yes  Number falls in past yr: 1  Injury with Fall? Yes   Functional Status Survey:    Vitals:   10/23/16 1616  BP: 105/62  Pulse: (!) 56  Resp: 18  Temp: 99.1 F (37.3 C)  SpO2: 97%   There is no height or weight on file to calculate BMI. Physical Exam  Constitutional: No distress.  HENT:  Head: Normocephalic and atraumatic.  Nose: Nose normal.  Mouth/Throat: Oropharynx is clear and moist. No oropharyngeal exudate.  Eyes: Conjunctivae are normal. Right eye exhibits no discharge. Left eye exhibits no discharge.  Left pupil oval shaped, minimally reactive, right pupil reactive to light  Neck: Normal range of motion. Neck supple. No JVD present. No tracheal deviation present. No thyromegaly present.  Cardiovascular: Normal rate and regular rhythm.   No murmur heard. Trace edema bilat  Pulmonary/Chest: Effort normal and breath sounds normal. No respiratory distress. He has no wheezes.  Abdominal: Soft. Bowel  sounds are normal. He exhibits no distension. There is no tenderness.  Not able to palpate bladder  Musculoskeletal: He exhibits no edema or tenderness.  Lymphadenopathy:    He has no cervical adenopathy.  Neurological: He is alert. No cranial nerve deficit.  Oriented to self, not place, time, or situation  Skin: Skin is warm and dry. He is not diaphoretic.  Psychiatric: He has a normal mood and affect.  Nursing note and vitals reviewed.   Labs reviewed:  Recent Labs  07/27/16 1043 07/28/16 0455 08/22/16  NA 137 135 145  K 5.1 4.3 4.1  CL 102 102  --   CO2 26 27  --   GLUCOSE 160* 111*  --   BUN 25* 25* 22*  CREATININE 1.09 1.09 1.0  CALCIUM 9.5 9.3  --     Recent Labs  07/11/16 08/22/16  AST 14 12*  ALT 16 14  ALKPHOS 117 103    Recent Labs  07/27/16 1043 07/28/16 0455 08/22/16  WBC 11.1* 8.5 6.9  HGB 10.7* 10.4* 11.0*  HCT 32.3* 31.5* 34*  MCV 93.4  93.5  --   PLT 186 192 203   No results found for: TSH Lab Results  Component Value Date   HGBA1C 8.1 07/11/2016   Lab Results  Component Value Date   CHOL 149 07/11/2016   HDL 63 07/11/2016   LDLCALC 63 07/11/2016   TRIG 116 07/11/2016    Significant Diagnostic Results in last 30 days:  No results found.  Assessment/Plan  1. Nocturia Worse per staff with low grade temp and reports of dysuria over the weekend, which have now improved Not able to pass catheter for specimen but also not able to palpate bladder on exam Will obtain clean catch specimen Await urology input at next visit and continue current meds Check BMP to eval renal function. Monitor urine output and report if <200cc a shift  2. Unsteady gait Will check labs to rule out other reasons for his issues with gait which were not present during my visit but have been noted by staff. Most likely this is due to his progression in dementia.  ?if underlying UTI is making this worse Will order PT to eval and treat as well   3. Mixed hyperlipidemia Check lipid panel Continue  Lipitor 80 mg qd, consider reduced dosing  4. Uncontrolled type 2 diabetes mellitus with complication, without long-term current use of insulin (HCC) A1C above goal which would be < 8%, and could contribute to his risk of infection, bladder issues, etc Check A1C and add Tradjenta if above goal  D/C mucinex, no clear benefit and no longer has nasal congestion  Family/ staff Communication: discussed with resident and staff  Labs/tests ordered:  CBC BMP Lipid panel, A1C, uric acid, B12

## 2016-10-24 DIAGNOSIS — R319 Hematuria, unspecified: Secondary | ICD-10-CM | POA: Diagnosis not present

## 2016-10-24 DIAGNOSIS — N390 Urinary tract infection, site not specified: Secondary | ICD-10-CM | POA: Diagnosis not present

## 2016-10-24 DIAGNOSIS — I10 Essential (primary) hypertension: Secondary | ICD-10-CM | POA: Diagnosis not present

## 2016-10-24 DIAGNOSIS — E119 Type 2 diabetes mellitus without complications: Secondary | ICD-10-CM | POA: Diagnosis not present

## 2016-10-24 DIAGNOSIS — R509 Fever, unspecified: Secondary | ICD-10-CM | POA: Diagnosis not present

## 2016-10-24 DIAGNOSIS — D649 Anemia, unspecified: Secondary | ICD-10-CM | POA: Diagnosis not present

## 2016-10-24 DIAGNOSIS — M109 Gout, unspecified: Secondary | ICD-10-CM | POA: Diagnosis not present

## 2016-10-24 DIAGNOSIS — R350 Frequency of micturition: Secondary | ICD-10-CM | POA: Diagnosis not present

## 2016-10-24 LAB — VITAMIN B12: Vitamin B-12: 2000

## 2016-10-26 DIAGNOSIS — D649 Anemia, unspecified: Secondary | ICD-10-CM | POA: Diagnosis not present

## 2016-10-26 DIAGNOSIS — E876 Hypokalemia: Secondary | ICD-10-CM | POA: Diagnosis not present

## 2016-10-26 LAB — LIPID PANEL
Cholesterol: 124 mg/dL (ref 0–200)
HDL: 40 mg/dL (ref 35–70)
LDL CALC: 62 mg/dL
Triglycerides: 111 mg/dL (ref 40–160)

## 2016-10-26 LAB — BASIC METABOLIC PANEL
BUN: 18 mg/dL (ref 4–21)
Creatinine: 0.7 mg/dL (ref 0.6–1.3)
GLUCOSE: 124 mg/dL
Potassium: 3.3 mmol/L — AB (ref 3.4–5.3)
Sodium: 146 mmol/L (ref 137–147)

## 2016-10-26 LAB — CBC AND DIFFERENTIAL
HCT: 36 % — AB (ref 41–53)
HEMOGLOBIN: 11.9 g/dL — AB (ref 13.5–17.5)
PLATELETS: 215 10*3/uL (ref 150–399)
WBC: 6.1 10*3/mL

## 2016-10-26 LAB — HEMOGLOBIN A1C: HEMOGLOBIN A1C: 6.7

## 2016-10-31 ENCOUNTER — Non-Acute Institutional Stay: Payer: PPO | Admitting: Adult Health

## 2016-10-31 ENCOUNTER — Encounter: Payer: Self-pay | Admitting: Adult Health

## 2016-10-31 DIAGNOSIS — I119 Hypertensive heart disease without heart failure: Secondary | ICD-10-CM | POA: Diagnosis not present

## 2016-10-31 DIAGNOSIS — E790 Hyperuricemia without signs of inflammatory arthritis and tophaceous disease: Secondary | ICD-10-CM

## 2016-10-31 DIAGNOSIS — F0280 Dementia in other diseases classified elsewhere without behavioral disturbance: Secondary | ICD-10-CM

## 2016-10-31 DIAGNOSIS — R410 Disorientation, unspecified: Secondary | ICD-10-CM | POA: Diagnosis not present

## 2016-10-31 DIAGNOSIS — I10 Essential (primary) hypertension: Secondary | ICD-10-CM

## 2016-10-31 DIAGNOSIS — D649 Anemia, unspecified: Secondary | ICD-10-CM | POA: Diagnosis not present

## 2016-10-31 DIAGNOSIS — E039 Hypothyroidism, unspecified: Secondary | ICD-10-CM | POA: Diagnosis not present

## 2016-10-31 DIAGNOSIS — N400 Enlarged prostate without lower urinary tract symptoms: Secondary | ICD-10-CM

## 2016-10-31 DIAGNOSIS — G3109 Other frontotemporal dementia: Secondary | ICD-10-CM | POA: Diagnosis not present

## 2016-10-31 DIAGNOSIS — F329 Major depressive disorder, single episode, unspecified: Secondary | ICD-10-CM | POA: Diagnosis not present

## 2016-10-31 DIAGNOSIS — R319 Hematuria, unspecified: Secondary | ICD-10-CM | POA: Diagnosis not present

## 2016-10-31 DIAGNOSIS — E860 Dehydration: Secondary | ICD-10-CM | POA: Diagnosis not present

## 2016-10-31 DIAGNOSIS — E119 Type 2 diabetes mellitus without complications: Secondary | ICD-10-CM | POA: Diagnosis not present

## 2016-10-31 DIAGNOSIS — R05 Cough: Secondary | ICD-10-CM | POA: Diagnosis not present

## 2016-10-31 DIAGNOSIS — F0393 Unspecified dementia, unspecified severity, with mood disturbance: Secondary | ICD-10-CM

## 2016-10-31 HISTORY — DX: Hyperuricemia without signs of inflammatory arthritis and tophaceous disease: E79.0

## 2016-10-31 LAB — TSH: TSH: 0.77 u[IU]/mL (ref <=5.90)

## 2016-10-31 NOTE — Addendum Note (Signed)
Addended by: Barnie Mort on: 10/31/2016 12:49 PM   Modules accepted: Level of Service

## 2016-10-31 NOTE — Progress Notes (Signed)
Location:  Occupational psychologist of Service:  ALF (13) Provider:   Cindi Carbon, ANP [place] [phone]   REED, Jonelle Sidle, DO  Patient Care Team: [name], DO as PCP - General (Geriatric Medicine)  Extended Emergency Contact Information Primary Emergency Contact: Alley,Kelly Address: [address]          [place], [place] [address] [name] of [place] Phone: [phone] Relation: Daughter  Code Status:  DNR Goals of care: Advanced Directive information Advanced Directives 10/23/2016  Does Patient Have a Medical Advance Directive? Yes  Type of Paramedic of [place];Living will;Out of facility DNR (pink MOST or yellow form)  Does patient want to make changes to medical advance directive? -  Copy of [place] in Chart? Yes     Chief Complaint  Patient presents with  . Acute Visit    delirium    HPI:  Pt is a 76 y.o. male seen today for an acute visit for delirium.  The staff reported that as of this am he was agitated, hitting, kicking, and had an unsteady gait. He has not had a fever or change in other VS. His appetite has been decreased for the past few weeks with decreased fluid intake as well. Last week he was seen for nocturia and low grade temp. His urine showed no growth and other labs were fairly unremarkable except that his K was 2.7 and his NA was 146.  The K was repleted (K afterwards 3.3) and fluids were encouraged.  He has advanced dementia and has shown signs of unsteady gait and so a PT eval was ordered but they have not started yet. He does not typically have any of the behaviors described and appears lethargic as well.  We obtained a urine specimen and drained his bladder with only 120 cc in the bladder so he does not appear to be retaining. He is followed by Urology for BPH and is on Uroxatral and Myrbetriq. Nurses report there was no nocturia last night and that he  slept well.  He has a cough that just started today with clear nasal drainage and watery eyes. Apparently he ambulated outside yesterday and there is concern for allergies.  He has lost 12 lbs since Feb 28th  Past Medical History:  Diagnosis Date  . Dementia   . Diabetes ([place])   . Heart disease   . Hyperlipidemia   . Hypotension    Past Surgical History:  Procedure Laterality Date  . APPENDECTOMY  1961  . CORONARY ANGIOPLASTY WITH STENT PLACEMENT  2016  . TONSILLECTOMY AND ADENOIDECTOMY    . TOTAL KNEE ARTHROPLASTY  2012    Allergies  Allergen Reactions  . Lortab [Hydrocodone-Acetaminophen] Other (See Comments)    Reaction:  Affected pts cognition   . Morphine And Related Other (See Comments)    Reaction:  Affected pts cognition     Outpatient Encounter Prescriptions as of 10/31/2016  Medication Sig  . acetaminophen (TYLENOL) 650 MG CR tablet Take 650 mg by mouth daily.  Marland Kitchen alfuzosin (UROXATRAL) 10 MG 24 hr tablet Take 10 mg by mouth at bedtime.  Marland Kitchen allopurinol (ZYLOPRIM) 100 MG tablet Take 1 tablet (100 mg total) by mouth daily.  Marland Kitchen aspirin EC 81 MG tablet Take 81 mg by mouth daily.  Marland Kitchen atorvastatin (LIPITOR) 80 MG tablet Take 1 tablet (80 mg total) by mouth daily.  . clopidogrel (PLAVIX) 75 MG tablet Take 1 tablet (  75 mg total) by mouth daily.  Marland Kitchen gabapentin (NEURONTIN) 100 MG capsule Take 200 mg by mouth at bedtime.  . Memantine HCl-Donepezil HCl (NAMZARIC) 28-10 MG CP24 Take 1 capsule by mouth daily.  . metFORMIN (GLUCOPHAGE) 500 MG tablet Take 1 tablet (500 mg total) by mouth 2 (two) times daily with a meal.  . mirabegron ER (MYRBETRIQ) 50 MG TB24 tablet Take 50 mg by mouth daily.  . nitroGLYCERIN (NITROSTAT) 0.4 MG SL tablet Place 0.4 mg under the tongue every 5 (five) minutes as needed for chest pain.  . vitamin B-12 (CYANOCOBALAMIN) 1000 MCG tablet Take 1 tablet (1,000 mcg total) by mouth daily.  . [DISCONTINUED] guaiFENesin (MUCINEX) 600 MG 12 hr tablet Take 2 tablets  (1,200 mg total) by mouth 2 (two) times daily.   No facility-administered encounter medications on file as of 10/31/2016.     Review of Systems  Unable to perform ROS: Dementia    Immunization History  Administered Date(s) Administered  . Influenza-Unspecified 05/05/2010, 04/11/2013, 03/03/2015  . Pneumococcal Polysaccharide-23 12/25/2010  . Pneumococcal-Unspecified 02/28/2013  . Tdap 03/22/2006  . Zoster 11/01/2012   Pertinent  Health Maintenance Due  Topic Date Due  . FOOT EXAM  01/15/1951  . OPHTHALMOLOGY EXAM  01/15/1951  . URINE MICROALBUMIN  01/15/1951  . COLONOSCOPY  07/31/2013  . PNA vac Low Risk Adult (2 of 2 - PCV13) 02/28/2014  . HEMOGLOBIN A1C  01/08/2017  . INFLUENZA VACCINE  01/31/2017   Fall Risk  04/05/2016  Falls in the past year? Yes  Number falls in past yr: 1  Injury with Fall? Yes   Functional Status Survey:    Vitals:   10/31/16 1117  BP: 108/76  Pulse: 62  Resp: 20  Temp: 97.3 F (36.3 C)  SpO2: 97%  Weight: 158 lb 3.2 oz (71.8 kg)   There is no height or weight on file to calculate BMI.  Wt Readings from Last 3 Encounters:  10/31/16 158 lb 3.2 oz (71.8 kg)  09/04/16 167 lb 8 oz (76 kg)  08/30/16 170 lb 3.2 oz (77.2 kg)   Physical Exam  Constitutional: No distress.  HENT:  Head: Normocephalic and atraumatic.  Watery eyes, clear nasal drainage. Tried bite me for the oral exam.   Eyes: Conjunctivae are normal. Pupils are equal, round, and reactive to light.  Neck: Normal range of motion. Neck supple. No JVD present. No tracheal deviation present. No thyromegaly present.  Cardiovascular: Normal rate and regular rhythm.   No murmur heard. Pulmonary/Chest: Effort normal and breath sounds normal. No respiratory distress. He has no wheezes.  Abdominal: Soft. Bowel sounds are normal. He exhibits no distension. There is no tenderness.  Musculoskeletal: He exhibits no edema or tenderness.  Unsteady gait, generally weak, not able to f/c    Lymphadenopathy:    He has no cervical adenopathy.  Neurological:  No obvious focal deficit.  Sleepy but arouses to verbal stimulus. Not able to f/c.  Skin: Skin is warm and dry. He is not diaphoretic.  Nursing note and vitals reviewed.   Labs reviewed:  Recent Labs  07/27/16 1043 07/28/16 0455 08/22/16 10/26/16  NA 137 135 145 146  K 5.1 4.3 4.1 3.3*  CL 102 102  --   --   CO2 26 27  --   --   GLUCOSE 160* 111*  --   --   BUN 25* 25* 22* 18  CREATININE 1.09 1.09 1.0 0.7  CALCIUM 9.5 9.3  --   --  Recent Labs  07/11/16 08/22/16  AST 14 12*  ALT 16 14  ALKPHOS 117 103    Recent Labs  07/27/16 1043 07/28/16 0455 08/22/16 10/26/16  WBC 11.1* 8.5 6.9 6.1  HGB 10.7* 10.4* 11.0* 11.9*  HCT 32.3* 31.5* 34* 36*  MCV 93.4 93.5  --   --   PLT 186 192 203 215   No results found for: TSH Lab Results  Component Value Date   HGBA1C 6.7 10/26/2016   Lab Results  Component Value Date   CHOL 124 10/26/2016   HDL 40 10/26/2016   LDLCALC 62 10/26/2016   TRIG 111 10/26/2016    Significant Diagnostic Results in last 30 days:  No results found.   10/23/16 Uric acid 5.1, B12 >1000  Assessment/Plan  1. Delirium Significant change over the past 24 hrs ?if he is dehydrated vs. Acute infection Does not appear to have a focal deficit He is a DNR but his daughter would like for him to receive treatment for acute issues. We will try to obtain labs and xrays here and treat accordingly. We may have difficulty with this due to his agitation. If so we will send him to the ER for evaluation. His VS are stable at this point and we would like to avoid hospitalizations as this would only agitate him further.   2. Diabetes mellitus type 2 without retinopathy ([place]) Controlled A1C down to 6.7 Continue metformin 500 mg BID  3. Frontal dementia Advanced but typically is ambulatory and able to f/c He still benefits from Namzaric  4. BPH without obstruction/lower urinary tract  symptoms He did not appear to be retaining urine for our exam Continue current meds, follow up with urology UA sent  5. Essential hypertension Controlled without meds  6. 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 If he has worsening depression which contributes to his poor intake vs. Just advancing dementia Consider Remeron due to weight loss  8. Hyperuricemia Uric acid 5.1 Continue allopurinol 100 mgqd  9. Cough New onset with runny nose and watery eyes, may be due to allergies Will check CXR, if normal will consider zyrtec when his mental status improves   Family/ staff Communication: discussed with the daughter, Claiborne Billings  Labs/tests ordered:  CBC CMP TSH CXR UA C AND S

## 2016-11-01 ENCOUNTER — Other Ambulatory Visit: Payer: Self-pay | Admitting: Adult Health

## 2016-11-01 DIAGNOSIS — R4182 Altered mental status, unspecified: Secondary | ICD-10-CM

## 2016-11-01 DIAGNOSIS — R531 Weakness: Secondary | ICD-10-CM

## 2016-11-02 ENCOUNTER — Other Ambulatory Visit: Payer: PPO

## 2016-11-06 DIAGNOSIS — R2681 Unsteadiness on feet: Secondary | ICD-10-CM | POA: Diagnosis not present

## 2016-11-06 DIAGNOSIS — R278 Other lack of coordination: Secondary | ICD-10-CM | POA: Diagnosis not present

## 2016-11-06 DIAGNOSIS — F0391 Unspecified dementia with behavioral disturbance: Secondary | ICD-10-CM | POA: Diagnosis not present

## 2016-11-06 DIAGNOSIS — R2689 Other abnormalities of gait and mobility: Secondary | ICD-10-CM | POA: Diagnosis not present

## 2016-11-07 ENCOUNTER — Encounter (HOSPITAL_COMMUNITY): Payer: Self-pay

## 2016-11-07 ENCOUNTER — Ambulatory Visit (HOSPITAL_COMMUNITY)
Admission: RE | Admit: 2016-11-07 | Discharge: 2016-11-07 | Disposition: A | Discharge status: Home / Self Care | Payer: PPO | Source: Ambulatory Visit | Attending: Adult Health | Admitting: Adult Health

## 2016-11-07 DIAGNOSIS — I739 Peripheral vascular disease, unspecified: Secondary | ICD-10-CM | POA: Diagnosis not present

## 2016-11-07 DIAGNOSIS — R4182 Altered mental status, unspecified: Secondary | ICD-10-CM | POA: Diagnosis not present

## 2016-11-07 DIAGNOSIS — R531 Weakness: Secondary | ICD-10-CM | POA: Diagnosis not present

## 2016-11-09 ENCOUNTER — Non-Acute Institutional Stay: Payer: PPO | Admitting: Adult Health

## 2016-11-09 DIAGNOSIS — R2681 Unsteadiness on feet: Secondary | ICD-10-CM | POA: Diagnosis not present

## 2016-11-09 DIAGNOSIS — N400 Enlarged prostate without lower urinary tract symptoms: Secondary | ICD-10-CM

## 2016-11-09 DIAGNOSIS — I25118 Atherosclerotic heart disease of native coronary artery with other forms of angina pectoris: Secondary | ICD-10-CM | POA: Diagnosis not present

## 2016-11-09 DIAGNOSIS — F329 Major depressive disorder, single episode, unspecified: Secondary | ICD-10-CM | POA: Diagnosis not present

## 2016-11-09 DIAGNOSIS — G250 Essential tremor: Secondary | ICD-10-CM

## 2016-11-09 DIAGNOSIS — E782 Mixed hyperlipidemia: Secondary | ICD-10-CM

## 2016-11-09 DIAGNOSIS — G3109 Other frontotemporal dementia: Secondary | ICD-10-CM | POA: Diagnosis not present

## 2016-11-09 DIAGNOSIS — R634 Abnormal weight loss: Secondary | ICD-10-CM

## 2016-11-09 DIAGNOSIS — F0393 Unspecified dementia, unspecified severity, with mood disturbance: Secondary | ICD-10-CM

## 2016-11-09 DIAGNOSIS — F0280 Dementia in other diseases classified elsewhere without behavioral disturbance: Secondary | ICD-10-CM | POA: Diagnosis not present

## 2016-11-09 DIAGNOSIS — E119 Type 2 diabetes mellitus without complications: Secondary | ICD-10-CM

## 2016-11-10 ENCOUNTER — Telehealth: Payer: Self-pay | Admitting: Neurology

## 2016-11-10 DIAGNOSIS — R2689 Other abnormalities of gait and mobility: Secondary | ICD-10-CM | POA: Diagnosis not present

## 2016-11-10 DIAGNOSIS — R278 Other lack of coordination: Secondary | ICD-10-CM | POA: Diagnosis not present

## 2016-11-10 DIAGNOSIS — R2681 Unsteadiness on feet: Secondary | ICD-10-CM | POA: Diagnosis not present

## 2016-11-10 DIAGNOSIS — F0391 Unspecified dementia with behavioral disturbance: Secondary | ICD-10-CM | POA: Diagnosis not present

## 2016-11-10 NOTE — Telephone Encounter (Signed)
This pt appt is for 60 min and 5/30/appt needs to be r/s due to provider will not be in the office that day. Please call to r/s this appt since it is a 60 min appt.  Thank you

## 2016-11-13 ENCOUNTER — Encounter: Payer: Self-pay | Admitting: Adult Health

## 2016-11-13 DIAGNOSIS — R269 Unspecified abnormalities of gait and mobility: Secondary | ICD-10-CM | POA: Insufficient documentation

## 2016-11-13 DIAGNOSIS — R2689 Other abnormalities of gait and mobility: Secondary | ICD-10-CM | POA: Diagnosis not present

## 2016-11-13 DIAGNOSIS — R2681 Unsteadiness on feet: Secondary | ICD-10-CM | POA: Diagnosis not present

## 2016-11-13 DIAGNOSIS — F0391 Unspecified dementia with behavioral disturbance: Secondary | ICD-10-CM | POA: Diagnosis not present

## 2016-11-13 DIAGNOSIS — R278 Other lack of coordination: Secondary | ICD-10-CM | POA: Diagnosis not present

## 2016-11-13 HISTORY — DX: Unspecified abnormalities of gait and mobility: R26.9

## 2016-11-13 NOTE — Telephone Encounter (Signed)
Jose Castillo, where did you see my schedule was closed that day? Were you looking at someone else's schedule? thanks

## 2016-11-13 NOTE — Telephone Encounter (Signed)
I am not aware I am out the 30th thanks

## 2016-11-13 NOTE — Progress Notes (Signed)
Location:  Occupational psychologist of Service:  ALF (13) Provider:   Cindi Carbon, ANP [place] [phone]   [name], DO  Patient Care Team: [name], DO as PCP - General (Geriatric Medicine)  Extended Emergency Contact Information Primary Emergency Contact: Alley,Kelly Address: [address]          [place], [place] [address] [name] of [place] Phone: [phone] Relation: Daughter Secondary Emergency Contact: Masini,[name] Address: [address] [place] [address] [place] of [place] Phone: [phone] Relation: Son  Code Status:  DNR Goals of care: Advanced Directive information Advanced Directives 10/23/2016  Does Patient Have a Medical Advance Directive? Yes  Type of Paramedic of [place];Living will;Out of facility DNR (pink MOST or yellow form)  Does patient want to make changes to medical advance directive? -  Copy of [place] in Chart? Yes     Chief Complaint  Patient presents with  . Medical Management of Chronic Issues    HPI:  Pt is a 76 y.o. male seen today for medical management of chronic diseases.  Resides in memory care due to dementia. He is diagnosed with frontal dementia and followed by Dr. Jaynee Eagles.  At the beginning of May he developed an acute change in his mental status with agitation, lethargy, hitting, kicking, and poor intake.  He was found to have orthostatic hypotension and was treated with IVF. Other labs, urine,and xrays were normal. CT of the head was ordered and showed no acute changes but chronic small vessel disease, white matter, atrophy,and  advanced vascular calcification in the skull base internal carotid arteries. No signs of large vessel occlusion. His behavior improved during the day but he remains combative at night and has difficulty sleeping at times He has poor intake and has had a weight loss but his  weight had originally increased on arrival due to larger portions served on the unit.  Wt Readings from Last 3 Encounters:  11/13/16 158 lb (71.7 kg)  10/31/16 158 lb 3.2 oz (71.8 kg)  09/04/16 167 lb 8 oz (76 kg)  The staff is wondering what we can do to help his behavior.  He also has had an unsteady gait at times. PT was consulted and he obtained a walker. Unfortunately he forgets to use it regularly. He has a hx of nocturia and is currently on mybetriq and uroxatral.    DMII: metformin was reduced to 250 mg BID due to poor intake and A1C below goal  Lab Results  Component Value Date   HGBA1C 6.7 10/26/2016   He has a hx of CAD with cardiac stent, currently on plavix and aspirin, also statin Lab Results  Component Value Date   [place] 62 10/26/2016     Past Medical History:  Diagnosis Date  . Dementia   . Diabetes ([place])   . Heart disease   . Hyperlipidemia   . Hypotension    Past Surgical History:  Procedure Laterality Date  . APPENDECTOMY  1961  . CORONARY ANGIOPLASTY WITH STENT PLACEMENT  2016  . TONSILLECTOMY AND ADENOIDECTOMY    . TOTAL KNEE ARTHROPLASTY  2012    Allergies  Allergen Reactions  . Lortab [Hydrocodone-Acetaminophen] Other (See Comments)    Reaction:  Affected pts cognition   . Morphine And Related Other (See Comments)    Reaction:  Affected pts cognition  Outpatient Encounter Prescriptions as of 11/09/2016  Medication Sig  . acetaminophen (TYLENOL) 650 MG CR tablet Take 650 mg by mouth daily.  Marland Kitchen alfuzosin (UROXATRAL) 10 MG 24 hr tablet Take 10 mg by mouth at bedtime.  Marland Kitchen allopurinol (ZYLOPRIM) 100 MG tablet Take 1 tablet (100 mg total) by mouth daily.  Marland Kitchen aspirin EC 81 MG tablet Take 81 mg by mouth daily.  Marland Kitchen atorvastatin (LIPITOR) 80 MG tablet Take 1 tablet (80 mg total) by mouth daily.  . clopidogrel (PLAVIX) 75 MG tablet Take 1 tablet (75 mg total) by mouth daily.  Marland Kitchen gabapentin (NEURONTIN) 100 MG capsule Take 200 mg by mouth at bedtime.  .  Memantine HCl-Donepezil HCl (NAMZARIC) 28-10 MG CP24 Take 1 capsule by mouth daily.  . metFORMIN (GLUCOPHAGE) 500 MG tablet Take 1 tablet (500 mg total) by mouth 2 (two) times daily with a meal.  . mirabegron ER (MYRBETRIQ) 50 MG TB24 tablet Take 50 mg by mouth daily.  . nitroGLYCERIN (NITROSTAT) 0.4 MG SL tablet Place 0.4 mg under the tongue every 5 (five) minutes as needed for chest pain.  . vitamin B-12 (CYANOCOBALAMIN) 1000 MCG tablet Take 1 tablet (1,000 mcg total) by mouth daily.   No facility-administered encounter medications on file as of 11/09/2016.     Review of Systems  Unable to perform ROS: Dementia    Immunization History  Administered Date(s) Administered  . Influenza-Unspecified 05/05/2010, 04/11/2013, 03/03/2015  . Pneumococcal Polysaccharide-23 12/25/2010  . Pneumococcal-Unspecified 02/28/2013  . Tdap 03/22/2006  . Zoster 11/01/2012   Pertinent  Health Maintenance Due  Topic Date Due  . FOOT EXAM  01/15/1951  . OPHTHALMOLOGY EXAM  01/15/1951  . URINE MICROALBUMIN  01/15/1951  . COLONOSCOPY  07/31/2013  . PNA vac Low Risk Adult (2 of 2 - PCV13) 02/28/2014  . INFLUENZA VACCINE  01/31/2017  . HEMOGLOBIN A1C  04/27/2017   Fall Risk  04/05/2016  Falls in the past year? Yes  Number falls in past yr: 1  Injury with Fall? Yes   Functional Status Survey:    Vitals:   11/13/16 1609  BP: (!) 148/85  Pulse: (!) 52  Resp: 18  Temp: 97.2 F (36.2 C)  SpO2: 97%  Weight: 158 lb (71.7 kg)   Body mass index is 26.29 kg/m.  Wt Readings from Last 3 Encounters:  11/13/16 158 lb (71.7 kg)  10/31/16 158 lb 3.2 oz (71.8 kg)  09/04/16 167 lb 8 oz (76 kg)    Physical Exam  Constitutional: No distress.  HENT:  Head: Normocephalic and atraumatic.  Neck: Normal range of motion. Neck supple. No JVD present. No tracheal deviation present. No thyromegaly present.  Cardiovascular: Normal rate and regular rhythm.   No murmur heard. Pulmonary/Chest: Effort normal and  breath sounds normal. No respiratory distress. He has no wheezes.  Abdominal: Soft. Bowel sounds are normal. He exhibits no distension. There is no tenderness.  Lymphadenopathy:    He has no cervical adenopathy.  Neurological: He is alert. No cranial nerve deficit.  Oriented to self only, agitates easily. Able to f/c  Skin: Skin is warm and dry. He is not diaphoretic.  Psychiatric:  flat    Labs reviewed:  Recent Labs  07/27/16 1043 07/28/16 0455 08/22/16 10/26/16  NA 137 135 145 146  K 5.1 4.3 4.1 3.3*  CL 102 102  --   --   CO2 26 27  --   --   GLUCOSE 160* 111*  --   --  BUN 25* 25* 22* 18  CREATININE 1.09 1.09 1.0 0.7  CALCIUM 9.5 9.3  --   --     Recent Labs  07/11/16 08/22/16  AST 14 12*  ALT 16 14  ALKPHOS 117 103    Recent Labs  07/27/16 1043 07/28/16 0455 08/22/16 10/26/16  WBC 11.1* 8.5 6.9 6.1  HGB 10.7* 10.4* 11.0* 11.9*  HCT 32.3* 31.5* 34* 36*  MCV 93.4 93.5  --   --   PLT 186 192 203 215   No results found for: TSH Lab Results  Component Value Date   HGBA1C 6.7 10/26/2016   Lab Results  Component Value Date   CHOL 124 10/26/2016   HDL 40 10/26/2016   LDLCALC 62 10/26/2016   TRIG 111 10/26/2016    Significant Diagnostic Results in last 30 days:  Ct Head Wo Contrast  Result Date: 11/07/2016 CLINICAL DATA:  Altered mental status. Dementia, generalized weakness. EXAM: CT HEAD WITHOUT CONTRAST TECHNIQUE: Contiguous axial images were obtained from the base of the skull through the vertex without intravenous contrast. COMPARISON:  None. FINDINGS: Brain: No evidence for acute infarction, hemorrhage, mass lesion, hydrocephalus, or extra-axial fluid. Generalized atrophy. Hypoattenuation of white matter favored to represent small vessel disease. Chronic subcortical infarction, BILATERAL insular white matter. Vascular: Advanced vascular calcification in the skull base internal carotid arteries. No signs of large vessel occlusion. Skull: Normal. Negative  for fracture or focal lesion. Sinuses/Orbits: No acute finding. Other: None. IMPRESSION: Atrophy and small vessel disease.  No acute intracranial findings. Electronically Signed   By: Staci Righter M.D.   On: 11/07/2016 14:35    Assessment/Plan 1. 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 Essential tremor Unchanged  4. BPH without obstruction/lower urinary tract symptoms Most recent PVR WNL Continue current meds F/U with urology as indicated  5. Weight loss Due to decreased intake from worsening dementia  6. Mixed hyperlipidemia LDL at goal Continue lipitor 80 mg qd  7. Diabetes mellitus type 2 without retinopathy ([facility]) Controlled Continue metformin at 250 mg BID  8. Coronary artery disease of native artery of native heart with stable angina pectoris ([facility]) Continue plavix and aspirin No current symptoms  9. Unsteady gait Intermittent issue Encourage walker use   Family/ staff Communication: discussed with staff who will communicate with his daughter Claiborne Billings  Labs/tests ordered:  NA

## 2016-11-14 ENCOUNTER — Encounter: Payer: Self-pay | Admitting: Internal Medicine

## 2016-11-14 NOTE — Telephone Encounter (Signed)
I have not received any records, Hilda Blades please let us know thanks

## 2016-11-14 NOTE — Telephone Encounter (Signed)
Dr Jaynee Eagles schedule is not closed on 5/30, there is a symbol beside the appt that designates the appt needs to be rescheduled.

## 2016-11-14 NOTE — Telephone Encounter (Signed)
Called and spoke w/ pt's daughter. She was under the impression that Dr. Jaynee Eagles was going to be on vacation on 11/29/16. However, she will be in the office on that day and daughter would like to keep appt as scheduled.  Daughter also asked if we have received medical records that were requested from pt's provider in [place]. Currently these are not found scanned in pt's chart. Will check w/ medical records.

## 2016-11-15 ENCOUNTER — Non-Acute Institutional Stay: Payer: PPO | Admitting: Internal Medicine

## 2016-11-15 ENCOUNTER — Telehealth: Payer: Self-pay | Admitting: *Deleted

## 2016-11-15 ENCOUNTER — Encounter: Payer: Self-pay | Admitting: Internal Medicine

## 2016-11-15 DIAGNOSIS — F0280 Dementia in other diseases classified elsewhere without behavioral disturbance: Secondary | ICD-10-CM

## 2016-11-15 DIAGNOSIS — G3109 Other frontotemporal dementia: Secondary | ICD-10-CM

## 2016-11-15 DIAGNOSIS — R509 Fever, unspecified: Secondary | ICD-10-CM

## 2016-11-15 DIAGNOSIS — R410 Disorientation, unspecified: Secondary | ICD-10-CM | POA: Diagnosis not present

## 2016-11-15 NOTE — Progress Notes (Addendum)
Location:  Occupational psychologist of Service:  ALF (13) Provider:  Ramsha Lonigro L. Mariea Clonts, D.O., C.M.D.  [name], DO  Patient Care Team: [name], DO as PCP - General (Geriatric Medicine)  Extended Emergency Contact Information Primary Emergency Contact: Alley,Kelly Address: [address]          [place], [place] [address] [name] of [place] Phone: [phone] Relation: Daughter Secondary Emergency Contact: Ben,[name] Address: [address] [place] [address] [place] of [place] Phone: [phone] Relation: Son  Code Status:  DNR Goals of care: Advanced Directive information Advanced Directives 10/23/2016  Does Patient Have a Medical Advance Directive? Yes  Type of Paramedic of [place];Living will;Out of facility DNR (pink MOST or yellow form)  Does patient want to make changes to medical advance directive? -  Copy of [place] in Chart? Yes     Chief Complaint  Patient presents with  . Acute Visit    low grade temp, increased agitation and combative behavior, hallucinating and reaching for items, disrobing in common area    HPI:  Pt is a 76 y.o. male seen today for an acute visit for above.  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  He had urinary concerns near the end of April and positive dip on 4/23.  Unfortunately, clean catch could not be obtained so it appears he never got abx.  He is much worse today which is a change since he was placed on remeron.  He previously had a similar reaction of agitation and reaching for things when he was started on zoloft the first time.  He's also been having other typical signs of decline related to his dementia including decreased po intake, weight loss, difficulty with sleep.  Temps yesterday were 100 and 100.6.  Upon review of nursing notes, it appears his behavior  has been getting progressively worse for the past 3 weeks, not just in the past few days.  There was mention of hematuria late April. UA done apparently was "negative" at one point so culture was not sent.     Past Medical History:  Diagnosis Date  . Dementia   . Diabetes ([place])   . Heart disease   . Hyperlipidemia   . Hypotension    Past Surgical History:  Procedure Laterality Date  . APPENDECTOMY  1961  . CORONARY ANGIOPLASTY WITH STENT PLACEMENT  2016  . TONSILLECTOMY AND ADENOIDECTOMY    . TOTAL KNEE ARTHROPLASTY  2012    Allergies  Allergen Reactions  . Lortab [Hydrocodone-Acetaminophen] Other (See Comments)    Reaction:  Affected pts cognition   . Morphine And Related Other (See Comments)    Reaction:  Affected pts cognition     Allergies as of 11/15/2016      Reactions   Lortab [hydrocodone-acetaminophen] Other (See Comments)   Reaction:  Affected pts cognition    Morphine And Related Other (See Comments)   Reaction:  Affected pts cognition       Medication List       Accurate as of 11/15/16  1:12 PM. Always use your most recent med list.          acetaminophen 650 MG CR tablet Commonly known as:  TYLENOL Take 650 mg by mouth daily.   alfuzosin 10 MG 24 hr tablet  Commonly known as:  UROXATRAL Take 10 mg by mouth at bedtime.   allopurinol 100 MG tablet Commonly known as:  ZYLOPRIM Take 1 tablet (100 mg total) by mouth daily.   aspirin EC 81 MG tablet Take 81 mg by mouth daily.   atorvastatin 80 MG tablet Commonly known as:  LIPITOR Take 1 tablet (80 mg total) by mouth daily.   clopidogrel 75 MG tablet Commonly known as:  PLAVIX Take 1 tablet (75 mg total) by mouth daily.   gabapentin 100 MG capsule Commonly known as:  NEURONTIN Take 200 mg by mouth at bedtime.   metFORMIN 500 MG tablet Commonly known as:  GLUCOPHAGE Take 1 tablet (500 mg total) by mouth 2 (two) times daily with a meal.   MYRBETRIQ 50 MG Tb24 tablet Generic drug:   mirabegron ER Take 50 mg by mouth daily.   NAMZARIC 28-10 MG Cp24 Generic drug:  Memantine HCl-Donepezil HCl Take 1 capsule by mouth daily.   nitroGLYCERIN 0.4 MG SL tablet Commonly known as:  NITROSTAT Place 0.4 mg under the tongue every 5 (five) minutes as needed for chest pain.   vitamin B-12 1000 MCG tablet Commonly known as:  CYANOCOBALAMIN Take 1 tablet (1,000 mcg total) by mouth daily.       Review of Systems  Constitutional: Negative for chills and fever.  HENT: Negative for congestion.   Eyes: Negative for blurred vision.  Respiratory: Negative for cough and shortness of breath.   Cardiovascular: Negative for chest pain and palpitations.  Gastrointestinal: Negative for abdominal pain, blood in stool, constipation, diarrhea and melena.  Genitourinary: Positive for dysuria and frequency. Negative for flank pain, hematuria and urgency.  Musculoskeletal: Negative for falls.  Neurological: Negative for dizziness and loss of consciousness.  Psychiatric/Behavioral: Positive for hallucinations and memory loss. The patient has insomnia.        Agitated, resistant to exam, but did permit eventually    Immunization History  Administered Date(s) Administered  . Influenza-Unspecified 05/05/2010, 04/11/2013, 03/03/2015  . Pneumococcal Polysaccharide-23 12/25/2010  . Pneumococcal-Unspecified 02/28/2013  . Tdap 03/22/2006  . Zoster 11/01/2012   Pertinent  Health Maintenance Due  Topic Date Due  . FOOT EXAM  01/15/1951  . OPHTHALMOLOGY EXAM  01/15/1951  . URINE MICROALBUMIN  01/15/1951  . COLONOSCOPY  07/31/2013  . PNA vac Low Risk Adult (2 of 2 - PCV13) 02/28/2014  . INFLUENZA VACCINE  01/31/2017  . HEMOGLOBIN A1C  04/27/2017   Fall Risk  04/05/2016  Falls in the past year? Yes  Number falls in past yr: 1  Injury with Fall? Yes   Functional Status Survey:    Vitals:   11/15/16 1311  BP: 118/60  Pulse: 66  Resp: 18  Temp: 99.8 F (37.7 C)  SpO2: 96%   There  is no height or weight on file to calculate BMI. Physical Exam  Constitutional: He appears well-developed. He appears distressed.  Cardiovascular: Normal rate, regular rhythm, normal heart sounds and intact distal pulses.   Pulmonary/Chest: Effort normal and breath sounds normal. No respiratory distress.  Abdominal: Soft. Bowel sounds are normal.  Neurological: He is alert.  Agitated, resisting examination, swinging; moving legs and bending them in and out; no tremor today; speech is not making sense, he's reaching up at things he's seeing  Skin: Capillary refill takes less than 2 seconds.  Very warm  Psychiatric:  Is cursing at staff; did not recognize me as his doctor either which is unusual    Labs reviewed:  Recent Labs  07/27/16 1043 07/28/16 0455 08/22/16 10/26/16  NA 137 135 145 146  K 5.1 4.3 4.1 3.3*  CL 102 102  --   --   CO2 26 27  --   --   GLUCOSE 160* 111*  --   --   BUN 25* 25* 22* 18  CREATININE 1.09 1.09 1.0 0.7  CALCIUM 9.5 9.3  --   --     Recent Labs  07/11/16 08/22/16  AST 14 12*  ALT 16 14  ALKPHOS 117 103    Recent Labs  07/27/16 1043 07/28/16 0455 08/22/16 10/26/16  WBC 11.1* 8.5 6.9 6.1  HGB 10.7* 10.4* 11.0* 11.9*  HCT 32.3* 31.5* 34* 36*  MCV 93.4 93.5  --   --   PLT 186 192 203 215   No results found for: TSH Lab Results  Component Value Date   HGBA1C 6.7 10/26/2016   Lab Results  Component Value Date   CHOL 124 10/26/2016   HDL 40 10/26/2016   LDLCALC 62 10/26/2016   TRIG 111 10/26/2016    Significant Diagnostic Results in last 30 days:  Ct Head Wo Contrast  Result Date: 11/07/2016 CLINICAL DATA:  Altered mental status. Dementia, generalized weakness. EXAM: CT HEAD WITHOUT CONTRAST TECHNIQUE: Contiguous axial images were obtained from the base of the skull through the vertex without intravenous contrast. COMPARISON:  None. FINDINGS: Brain: No evidence for acute infarction, hemorrhage, mass lesion, hydrocephalus, or extra-axial  fluid. Generalized atrophy. Hypoattenuation of white matter favored to represent small vessel disease. Chronic subcortical infarction, BILATERAL insular white matter. Vascular: Advanced vascular calcification in the skull base internal carotid arteries. No signs of large vessel occlusion. Skull: Normal. Negative for fracture or focal lesion. Sinuses/Orbits: No acute finding. Other: None. IMPRESSION: Atrophy and small vessel disease.  No acute intracranial findings. Electronically Signed   By: Staci Righter M.D.   On: 11/07/2016 14:35    Assessment/Plan 1. Delirium -seems this is going on for 3 weeks from what I read in notes, but dramatically worse this am -d/c remeron in case it's involved (increasing serotonin levels which affected him poorly when zoloft was first started) -will treat with abx given his severe hyperactive delirium and history suggesting he has had a UTI -cipro 500mg  po bid x 7 days after rocephin 1g IM once now -yogurt daily while on abx -push po fluids -just had labs that have been unremarkable and not in the state to permit them now  2. Low grade fever -will tx for possible UTI at this point (see above)  3. Frontal dementia -per prior neurologist -I wonder about lewy body dementia myself with his tremors, rapidly progressive dementia and intolerance to antipsychotics and anxiolytics  Family/ staff Communication: discussed with memory care nurse   [place]rick. Ali Mclaurin, D.O. [facility] [address], [place] [address] Cell Phone (Mon-Fri 8am-5pm):  [phone] On Call:  [phone] & follow prompts after 5pm & weekends Office Phone:  [phone] Office Fax:  [phone]

## 2016-11-15 NOTE — Telephone Encounter (Signed)
Urgent request faxed to Lori @[facility] [phone]  Requesting medical records and Imaging.

## 2016-11-15 NOTE — Telephone Encounter (Signed)
Pt medical records from [facility]  on [place] desk.

## 2016-11-16 DIAGNOSIS — D649 Anemia, unspecified: Secondary | ICD-10-CM | POA: Diagnosis not present

## 2016-11-16 DIAGNOSIS — R2681 Unsteadiness on feet: Secondary | ICD-10-CM | POA: Diagnosis not present

## 2016-11-16 DIAGNOSIS — R2689 Other abnormalities of gait and mobility: Secondary | ICD-10-CM | POA: Diagnosis not present

## 2016-11-16 DIAGNOSIS — F0391 Unspecified dementia with behavioral disturbance: Secondary | ICD-10-CM | POA: Diagnosis not present

## 2016-11-16 DIAGNOSIS — R278 Other lack of coordination: Secondary | ICD-10-CM | POA: Diagnosis not present

## 2016-11-16 DIAGNOSIS — F05 Delirium due to known physiological condition: Secondary | ICD-10-CM | POA: Diagnosis not present

## 2016-11-16 LAB — BASIC METABOLIC PANEL
BUN: 16 mg/dL (ref 4–21)
CREATININE: 0.7 mg/dL (ref 0.6–1.3)
Glucose: 124 mg/dL
Potassium: 4.1 mmol/L (ref 3.4–5.3)
Sodium: 149 mmol/L — AB (ref 137–147)

## 2016-11-16 LAB — CBC AND DIFFERENTIAL
HCT: 35 % — AB (ref 41–53)
HEMOGLOBIN: 11.3 g/dL — AB (ref 13.5–17.5)
PLATELETS: 201 10*3/uL (ref 150–399)
WBC: 9.9 10*3/mL

## 2016-11-16 NOTE — Telephone Encounter (Signed)
Records received from [facility] including ED visits, labs, imaging. Sent to med records for scanning, copy to Dr. Jaynee Eagles for review.  Called and notified dtr via TC. Reports that pt has developed new behaviors including agitation/aggresion. Was changed from Zoloft to Remeron and on antibiotics for possible infection but no improvement noted. Sooner appt scheduled for next Tues to discuss further.

## 2016-11-17 ENCOUNTER — Encounter: Payer: Self-pay | Admitting: Adult Health

## 2016-11-17 ENCOUNTER — Non-Acute Institutional Stay: Payer: PPO | Admitting: Adult Health

## 2016-11-17 DIAGNOSIS — R509 Fever, unspecified: Secondary | ICD-10-CM | POA: Diagnosis not present

## 2016-11-17 DIAGNOSIS — E870 Hyperosmolality and hypernatremia: Secondary | ICD-10-CM | POA: Diagnosis not present

## 2016-11-17 DIAGNOSIS — F0280 Dementia in other diseases classified elsewhere without behavioral disturbance: Secondary | ICD-10-CM

## 2016-11-17 DIAGNOSIS — F0391 Unspecified dementia with behavioral disturbance: Secondary | ICD-10-CM | POA: Diagnosis not present

## 2016-11-17 DIAGNOSIS — R410 Disorientation, unspecified: Secondary | ICD-10-CM

## 2016-11-17 DIAGNOSIS — D649 Anemia, unspecified: Secondary | ICD-10-CM | POA: Diagnosis not present

## 2016-11-17 DIAGNOSIS — I459 Conduction disorder, unspecified: Secondary | ICD-10-CM

## 2016-11-17 DIAGNOSIS — F0281 Dementia in other diseases classified elsewhere with behavioral disturbance: Secondary | ICD-10-CM | POA: Diagnosis not present

## 2016-11-17 DIAGNOSIS — G3109 Other frontotemporal dementia: Secondary | ICD-10-CM

## 2016-11-17 LAB — BASIC METABOLIC PANEL
BUN: 11 (ref 4–21)
CREATININE: 0.7 (ref 0.6–1.3)
GLUCOSE: 132
Potassium: 3.6 (ref 3.4–5.3)
Sodium: 140 (ref 137–147)

## 2016-11-17 NOTE — Progress Notes (Signed)
Location:  Occupational psychologist of Service:  ALF (13) Provider:   Cindi Carbon, ANP [place] [phone]   [name], DO  Patient Care Team: [name], DO as PCP - General (Geriatric Medicine)  Extended Emergency Contact Information Primary Emergency Contact: Alley,Kelly Address: [address]          [place], [place] [address] [name] of [place] Phone: [phone] Relation: Daughter Secondary Emergency Contact: Traynham,[name] Address: [address] [place] [address] [place] of [place] Phone: [phone] Relation: Son  Code Status:  DNR Goals of care: Advanced Directive information Advanced Directives 11/17/2016  Does Patient Have a Medical Advance Directive? Yes  Type of Paramedic of [place];Living will;Out of facility DNR (pink MOST or yellow form)  Does patient want to make changes to medical advance directive? -  Copy of [place] in Chart? Yes     Chief Complaint  Patient presents with  . Acute Visit    f/u delirium, IVF    HPI:  Pt is a 76 y.o. male seen today for an acute visit to follow up after an episode of delirium and hypernatremia.  He has dementia and resides in memory care. He has had periods of abrupt fluctuation in cognition, combativeness, tremors, unsteady gait, and poor po intake. He came to [place] with a dx of frontal dementia but there is concern that he presents more like Lewy Body dementia. He was started on Remeron on 5/10 due to decreased appetite, insomnia, and agitation. The symptoms listed above started on 5/16 He was seen by Dr. Mariea Clonts on 5/16 due to increased confusion and low grade temp. They were not able to obtain labs. He was started on rocephin for presumed UTI.  He did not eat or drink and was there for started on IVF NS.  Later labs were able to be obtained and revealed a sodium of 149.  IVF were changed to 1/2 NS. He  received 1.5 total in fluid. He is not sob, sats are WNL. He is more alert, less combative and ate 75% of breakfast.  He had one large loose stool but no abd pain or nausea. Temp 100.5  The nurse was able to give him oral meds this am so he can be changed to an oral antibiotic.    Past Medical History:  Diagnosis Date  . Dementia   . Diabetes ([place])   . Heart disease   . Hyperlipidemia   . Hypotension    Past Surgical History:  Procedure Laterality Date  . APPENDECTOMY  1961  . CORONARY ANGIOPLASTY WITH STENT PLACEMENT  2016  . TONSILLECTOMY AND ADENOIDECTOMY    . TOTAL KNEE ARTHROPLASTY  2012    Allergies  Allergen Reactions  . Lortab [Hydrocodone-Acetaminophen] Other (See Comments)    Reaction:  Affected pts cognition   . Morphine And Related Other (See Comments)    Reaction:  Affected pts cognition     Outpatient Encounter Prescriptions as of 11/17/2016  Medication Sig  . cefTRIAXone (ROCEPHIN) 1 g injection Inject 1 g into the muscle daily. X 5 days  . acetaminophen (TYLENOL) 650 MG CR tablet Take 650 mg by mouth daily.  Marland Kitchen alfuzosin (UROXATRAL) 10 MG 24 hr tablet Take 10 mg by mouth at bedtime.  Marland Kitchen allopurinol (ZYLOPRIM) 100 MG tablet Take 1 tablet (100 mg total) by mouth daily.  Marland Kitchen  aspirin EC 81 MG tablet Take 81 mg by mouth daily.  Marland Kitchen atorvastatin (LIPITOR) 80 MG tablet Take 1 tablet (80 mg total) by mouth daily.  . clopidogrel (PLAVIX) 75 MG tablet Take 1 tablet (75 mg total) by mouth daily.  Marland Kitchen gabapentin (NEURONTIN) 100 MG capsule Take 200 mg by mouth at bedtime.  . Memantine HCl-Donepezil HCl (NAMZARIC) 28-10 MG CP24 Take 1 capsule by mouth daily.  . metFORMIN (GLUCOPHAGE) 500 MG tablet Take 1 tablet (500 mg total) by mouth 2 (two) times daily with a meal. (Patient taking differently: Take 250 mg by mouth 2 (two) times daily with a meal. Takes 250 mg BID)  . mirabegron ER (MYRBETRIQ) 50 MG TB24 tablet Take 50 mg by mouth daily.  . nitroGLYCERIN (NITROSTAT) 0.4 MG SL  tablet Place 0.4 mg under the tongue every 5 (five) minutes as needed for chest pain.  . vitamin B-12 (CYANOCOBALAMIN) 1000 MCG tablet Take 1 tablet (1,000 mcg total) by mouth daily.   No facility-administered encounter medications on file as of 11/17/2016.     Review of Systems  Unable to perform ROS: Dementia    Immunization History  Administered Date(s) Administered  . Influenza-Unspecified 05/05/2010, 04/11/2013, 03/03/2015  . Pneumococcal Polysaccharide-23 12/25/2010  . Pneumococcal-Unspecified 02/28/2013  . Tdap 03/22/2006  . Zoster 11/01/2012   Pertinent  Health Maintenance Due  Topic Date Due  . FOOT EXAM  01/15/1951  . OPHTHALMOLOGY EXAM  01/15/1951  . URINE MICROALBUMIN  01/15/1951  . COLONOSCOPY  07/31/2013  . PNA vac Low Risk Adult (2 of 2 - PCV13) 02/28/2014  . INFLUENZA VACCINE  01/31/2017  . HEMOGLOBIN A1C  04/27/2017   Fall Risk  04/05/2016  Falls in the past year? Yes  Number falls in past yr: 1  Injury with Fall? Yes   Functional Status Survey:    Vitals:   11/17/16 1116  BP: 138/80  Pulse: 66  Resp: 18  Temp: (!) 100.5 F (38.1 C)   There is no height or weight on file to calculate BMI. Physical Exam  Constitutional: No distress.  HENT:  Head: Normocephalic and atraumatic.  Nose: Nose normal.  Mouth/Throat: Oropharynx is clear and moist. No oropharyngeal exudate.  Eyes: Conjunctivae and EOM are normal. Right eye exhibits no discharge. Left eye exhibits no discharge.  Left pupil reactive, oval shape. Right pupil reactive  Neck: Normal range of motion. Neck supple. No JVD present. No tracheal deviation present. No thyromegaly present.  Cardiovascular: Normal rate and regular rhythm.   No murmur heard. Occasional skipped beats  Pulmonary/Chest: Effort normal and breath sounds normal. No respiratory distress. He has no wheezes.  Abdominal: Soft. Bowel sounds are normal. He exhibits no distension. There is no tenderness.  Musculoskeletal:    Rigidity noted to BLE.  Tremors/jerking occasional to BUE and BLE.   Lymphadenopathy:    He has no cervical adenopathy.  Neurological: He is alert. No cranial nerve deficit.  Skin: Skin is warm and dry. He is not diaphoretic.  Psychiatric: He has a normal mood and affect.  Nursing note and vitals reviewed.   Labs reviewed:  Recent Labs  07/27/16 1043 07/28/16 0455 08/22/16 10/26/16 11/16/16  NA 137 135 145 146 149*  K 5.1 4.3 4.1 3.3* 4.1  CL 102 102  --   --   --   CO2 26 27  --   --   --   GLUCOSE 160* 111*  --   --   --   BUN 25*  25* 22* 18 16  CREATININE 1.09 1.09 1.0 0.7 0.7  CALCIUM 9.5 9.3  --   --   --     Recent Labs  07/11/16 08/22/16  AST 14 12*  ALT 16 14  ALKPHOS 117 103    Recent Labs  07/27/16 1043 07/28/16 0455 08/22/16 10/26/16 11/16/16  WBC 11.1* 8.5 6.9 6.1 9.9  HGB 10.7* 10.4* 11.0* 11.9* 11.3*  HCT 32.3* 31.5* 34* 36* 35*  MCV 93.4 93.5  --   --   --   PLT 186 192 203 215 201   Lab Results  Component Value Date   TSH 0.77 10/31/2016   Lab Results  Component Value Date   HGBA1C 6.7 10/26/2016   Lab Results  Component Value Date   CHOL 124 10/26/2016   HDL 40 10/26/2016   LDLCALC 62 10/26/2016   TRIG 111 10/26/2016    Significant Diagnostic Results in last 30 days:  Ct Head Wo Contrast  Result Date: 11/07/2016 CLINICAL DATA:  Altered mental status. Dementia, generalized weakness. EXAM: CT HEAD WITHOUT CONTRAST TECHNIQUE: Contiguous axial images were obtained from the base of the skull through the vertex without intravenous contrast. COMPARISON:  None. FINDINGS: Brain: No evidence for acute infarction, hemorrhage, mass lesion, hydrocephalus, or extra-axial fluid. Generalized atrophy. Hypoattenuation of white matter favored to represent small vessel disease. Chronic subcortical infarction, BILATERAL insular white matter. Vascular: Advanced vascular calcification in the skull base internal carotid arteries. No signs of large vessel  occlusion. Skull: Normal. Negative for fracture or focal lesion. Sinuses/Orbits: No acute finding. Other: None. IMPRESSION: Atrophy and small vessel disease.  No acute intracranial findings. Electronically Signed   By: Staci Righter M.D.   On: 11/07/2016 14:35    Assessment/Plan  1. Hypernatremia Labs pending  Much more alert and appears hydrated on exam  2. Acute delirium Resolving ?? 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 Skipped heart beats Noted on exam, EKG showed regular rhythm with PVCs  5. Fever Low grade Remains 100.5 ? LBD?  No point in obtaining a urine now that he is more calm as he has been on rocephin If no improvement will obtain another CXR which was normal in the past when this happened and he does not currently have any resp symptoms/no WBC Will change to Cipro 500 mg BID x 5 more days  Family/ staff Communication: discussed with staff, spoke with daughter Claiborne Billings on 5/17  Labs/tests ordered:  BMP pending

## 2016-11-20 NOTE — Addendum Note (Signed)
Addended by: [name] on: 11/20/2016 05:49 PM   Modules accepted: Level of Service

## 2016-11-21 ENCOUNTER — Encounter (INDEPENDENT_AMBULATORY_CARE_PROVIDER_SITE_OTHER): Payer: Self-pay

## 2016-11-21 ENCOUNTER — Ambulatory Visit (INDEPENDENT_AMBULATORY_CARE_PROVIDER_SITE_OTHER): Payer: PPO | Admitting: Neurology

## 2016-11-21 VITALS — BP 98/65 | HR 64 | Ht 65.0 in | Wt 158.0 lb

## 2016-11-21 DIAGNOSIS — F0391 Unspecified dementia with behavioral disturbance: Secondary | ICD-10-CM | POA: Diagnosis not present

## 2016-11-21 NOTE — Progress Notes (Signed)
[facility]    Provider:  Dr Jaynee Eagles Referring Provider: [name], DO Primary Care Physician:  [name], DO   CC:  Dementia, unclear what type.   Interval history 11/21/2016: Patient with frontotemporal dementia in a memory unit recently Dxed with acute delirium. He was agitated, hitting, kicking with an unsteady gait. He had recent decreased appetite and decrease in fluids. The previous week he had a low grade temperature. K was 2.7 and Na was 146. He had a cough with clear nasal drainage and allergies, he has lost weight. During exam he tried to bite physician. He was generally weak with watery eyes and clear nasal drainage. He was found to have orthostatic hypotension and was txed with fluids. CT of the head showed nothing acute. He did have a UTI on 4/23. He was placed on remeron and declined. He has decreased Po intake and weight loss and difficulty sleeping.   Per daughter he started getting agitated over several days and then became worse. He was having mild fevers and by Wednesday of last week he was kicking, swearing, yelling. He was having hallucinations, Saying non sense. They started anti-biotics and he has significantly improved.    HPI:  Jose Castillo is a 76 y.o. male here as a referral from Dr. Mariea Clonts for memory loss. 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 He has memory problems. Here with daughter who provides most information. Having trouble getting words out. Started in 2013. Slowly progressed. He gets lost in the car so doesn;t drive anymore. Wife passed away a month ago. After knee replacement in 2013 memory changes were noted significantly however may have been going on longer. They live in [place]. Daughter says he was seeing a neurologist previously. He was diagnosed with frontotemporal dementia. No personality changes. Patient is very gentle. He has significant  speaking troubles. Patient doesn;t remember conversations. Long-term memory is better. He is in the memory unit. They were both 76 when they died. No memory issues. He was a Psychologist, educational. Lots of concussions and "blows to the head". He is on Namzeric. There is some depression. No other focal neurologic deficits, associated symptoms, inciting events or modifiable factors.  Reviewed notes, labs and imaging from outside physicians, which showed:  hgba1c 8.1, BUN 5.1, creatinine 1.4 (07/11/2016)  Reviewed notes. Patient has been diagnosed with frontotemporal dementia, previous neurologist.  He wakes up and wanders around the house. He has difficulty with the locks and locks himself out. On Aricept and Namenda. No hallucinations and delusions worries he will get lost. Does have Word finding difficulties. Partner has to help him with his medications. Needs help with ADLs such as  Showering and dressing. He has only fallen once. Does have a tremor. There is some depression related to lack of ability to move. Has to think about it to put his pants on.    Review of Systems: Patient complains of symptoms per HPI as well as the following symptoms: memory loss. Pertinent negatives per HPI. All others negative.   Social History   Social History  . Marital status: Widowed    Spouse name: N/A  . Number of children: 3  . Years of education: MBA   Occupational History  . N/A    Social History Main Topics  . Smoking status: Former Smoker    Packs/day: 0.50    Years: 20.00    Types: Cigarettes    Quit date:  2003  . Smokeless tobacco: Never Used  . Alcohol use No     Comment: Quit 2013  . Drug use: No  . Sexual activity: No   Other Topics Concern  . Not on file   Social History Narrative   Right-handed      Do you drink/eat things with caffeine? occasional diet Coke      Marital status?  widowed                        What year were you married?  1966      Do you live in a house,  apartment, assisted living, condo, trailer, etc.? [employer]      Is it one or more stories? one      How many persons live in your home?      Do you have any pets in your home? (please list) none      Current or past profession: financial      Do you exercise?   yes                                   Type & how often? 1/day      Do you have a living will? yes      Do you have a DNR form?                                  If not, do you want to discuss one?      Do you have signed POA/HPOA for forms?        Family History  Problem Relation Age of Onset  . Cancer Mother 60       Lymphoma  . Heart attack Father 67  . Diabetes Sister   . Brain cancer Sister     Past Medical History:  Diagnosis Date  . Dementia   . Diabetes ([place])   . Heart disease   . Hyperlipidemia   . Hypotension     Past Surgical History:  Procedure Laterality Date  . APPENDECTOMY  1961  . CORONARY ANGIOPLASTY WITH STENT PLACEMENT  2016  . TONSILLECTOMY AND ADENOIDECTOMY    . TOTAL KNEE ARTHROPLASTY  2012    Current Outpatient Prescriptions  Medication Sig Dispense Refill  . acetaminophen (TYLENOL) 650 MG CR tablet Take 650 mg by mouth daily.    Marland Kitchen alfuzosin (UROXATRAL) 10 MG 24 hr tablet Take 10 mg by mouth at bedtime.    Marland Kitchen allopurinol (ZYLOPRIM) 100 MG tablet Take 1 tablet (100 mg total) by mouth daily. 30 tablet 3  . aspirin EC 81 MG tablet Take 81 mg by mouth daily.    Marland Kitchen atorvastatin (LIPITOR) 80 MG tablet Take 1 tablet (80 mg total) by mouth daily. 30 tablet 3  . cefTRIAXone (ROCEPHIN) 1 g injection Inject 1 g into the muscle daily. X 5 days    . clopidogrel (PLAVIX) 75 MG tablet Take 1 tablet (75 mg total) by mouth daily. 30 tablet 3  . gabapentin (NEURONTIN) 100 MG capsule Take 200 mg by mouth at bedtime.    . Memantine HCl-Donepezil HCl (NAMZARIC) 28-10 MG CP24 Take 1 capsule by mouth daily.    . metFORMIN (GLUCOPHAGE) 500 MG tablet Take 1 tablet (500 mg total) by mouth 2 (two)  times daily with  a meal. (Patient taking differently: Take 250 mg by mouth 2 (two) times daily with a meal. Takes 250 mg BID) 60 tablet 3  . mirabegron ER (MYRBETRIQ) 50 MG TB24 tablet Take 50 mg by mouth daily.    . nitroGLYCERIN (NITROSTAT) 0.4 MG SL tablet Place 0.4 mg under the tongue every 5 (five) minutes as needed for chest pain.    . vitamin B-12 (CYANOCOBALAMIN) 1000 MCG tablet Take 1 tablet (1,000 mcg total) by mouth daily. 30 tablet 3   No current facility-administered medications for this visit.     Allergies as of 11/21/2016 - Review Complete 11/20/2016  Allergen Reaction Noted  . Lortab [hydrocodone-acetaminophen] Other (See Comments) 03/02/2016  . Morphine and related Other (See Comments) 03/02/2016    Vitals: BP (!) 85/56 (BP Location: Left Arm)   Pulse 63  Last Weight:  Wt Readings from Last 1 Encounters:  11/13/16 158 lb (71.7 kg)   Last Height:   Ht Readings from Last 1 Encounters:  08/30/16 5\' 5"  (1.651 m)    Physical exam: Exam: Gen: NAD, mildly conversant, well nourised, well groomed                     CV: no MRG. No Carotid Bruits. No peripheral edema, warm, nontender Eyes: Conjunctivae clear without exudates or hemorrhage  Neuro: Detailed Neurologic Exam  Speech:    Speech with mild aphasia.  Cognition:      The patient is oriented to person, place, and time;     recent and remote memory impaired;     language fluent;     Impaired attention, concentration, fund of knowledge Cranial Nerves:    Left irregular pupil. The right pupil round, and reactive to light. The fundi are normal and spontaneous venous pulsations are present. Visual fields are full to finger confrontation. Impaired upgaze. Trigeminal sensation is intact and the muscles of mastication are normal. The face is symmetric. The palate elevates in the midline. Hearing intact. Voice is normal. Shoulder shrug is normal. The tongue has normal motion without fasciculations.    Coordination:    Normal finger to nose and heel to shin. Normal rapid alternating movements.   Gait: Slightly wide based  Motor Observation:    No asymmetry, no atrophy, and no involuntary movements noted. Tone:    Increased tone in all extremities   Posture:    Posture is normal. normal erect    Strength:    Strength is V/V in the upper and lower limbs.      Sensation: intact to LT     Reflex Exam:  DTR's:    Deep tendon reflexes in the upper and lower extremities are symmetrical bilaterally.   Toes:    The toes are downgoing bilaterally.   Clonus:    Clonus is absent.    Assessment/Plan:  76 year old with dementia of unclear etiology or type. Continue Namzeric. 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 Gave information on essential tremor and FTD. MMSE 19/30. Family would like to go forward with a PET scan to try to delineate what type of dementia for further treatment and future planning. Recent encephalopathy likely due to metabolic or infectious causes as he is improved after being treated with fluids and antibiotics.  FDG PET Scan  Cc: REED, TIFFANY, DO  A total of 30 minutes was spent in with this patient. Over half this time was spent on counseling patient on the dementia diagnosis and different therapeutic options available.

## 2016-11-29 ENCOUNTER — Ambulatory Visit: Payer: PPO | Admitting: Neurology

## 2016-12-08 ENCOUNTER — Telehealth: Payer: Self-pay | Admitting: Neurology

## 2016-12-08 NOTE — Telephone Encounter (Signed)
[name] with [facility] called and informed me that a authorization needs to be done before 2 pm this afternoon because in order for him to have his test done on Monday 12/11/16 they need the authorization done so they can order the medcine that comes along with the test.. I informed [name] that I can only fax clinical notes and I will mark it as urgent.Jose Castillo

## 2016-12-11 ENCOUNTER — Encounter (HOSPITAL_COMMUNITY): Payer: Self-pay

## 2016-12-11 ENCOUNTER — Ambulatory Visit (HOSPITAL_COMMUNITY)
Admission: RE | Admit: 2016-12-11 | Discharge: 2016-12-11 | Disposition: A | Discharge status: Home / Self Care | Payer: PPO | Source: Ambulatory Visit | Attending: Neurology | Admitting: Neurology

## 2016-12-11 DIAGNOSIS — F0391 Unspecified dementia with behavioral disturbance: Secondary | ICD-10-CM

## 2016-12-11 MED ORDER — FLUDEOXYGLUCOSE F - 18 (FDG) INJECTION
10.1300 | Freq: Once | INTRAVENOUS | Status: AC | PRN
  Administered 2016-12-11: 10.13 via INTRAVENOUS

## 2016-12-11 NOTE — Telephone Encounter (Signed)
Health Team auth for the PET is [id number] (exp. 12/08/16 to 03/08/17) He is scheduled to have his test done Monday 12/11/16.

## 2016-12-21 ENCOUNTER — Encounter: Payer: Self-pay | Admitting: Adult Health

## 2016-12-21 ENCOUNTER — Non-Acute Institutional Stay: Payer: PPO | Admitting: Adult Health

## 2016-12-21 DIAGNOSIS — G3109 Other frontotemporal dementia: Secondary | ICD-10-CM

## 2016-12-21 DIAGNOSIS — G4701 Insomnia due to medical condition: Secondary | ICD-10-CM | POA: Diagnosis not present

## 2016-12-21 DIAGNOSIS — F02818 Dementia in other diseases classified elsewhere, unspecified severity, with other behavioral disturbance: Secondary | ICD-10-CM

## 2016-12-21 DIAGNOSIS — F0281 Dementia in other diseases classified elsewhere with behavioral disturbance: Secondary | ICD-10-CM
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
## 2016-12-26 ENCOUNTER — Encounter: Payer: Self-pay | Admitting: Internal Medicine

## 2016-12-26 ENCOUNTER — Non-Acute Institutional Stay: Payer: PPO | Admitting: Internal Medicine

## 2016-12-26 DIAGNOSIS — E118 Type 2 diabetes mellitus with unspecified complications: Secondary | ICD-10-CM | POA: Diagnosis not present

## 2016-12-26 DIAGNOSIS — F329 Major depressive disorder, single episode, unspecified: Secondary | ICD-10-CM

## 2016-12-26 DIAGNOSIS — F0393 Unspecified dementia, unspecified severity, with mood disturbance: Secondary | ICD-10-CM

## 2016-12-26 DIAGNOSIS — F0281 Dementia in other diseases classified elsewhere with behavioral disturbance: Secondary | ICD-10-CM

## 2016-12-26 DIAGNOSIS — F0280 Dementia in other diseases classified elsewhere without behavioral disturbance: Secondary | ICD-10-CM | POA: Diagnosis not present

## 2016-12-26 DIAGNOSIS — F32A Depression, unspecified: Secondary | ICD-10-CM

## 2016-12-26 DIAGNOSIS — G3109 Other frontotemporal dementia: Secondary | ICD-10-CM | POA: Diagnosis not present

## 2016-12-26 DIAGNOSIS — R2681 Unsteadiness on feet: Secondary | ICD-10-CM

## 2016-12-26 DIAGNOSIS — G250 Essential tremor: Secondary | ICD-10-CM | POA: Diagnosis not present
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
## 2016-12-29 DIAGNOSIS — F0280 Dementia in other diseases classified elsewhere without behavioral disturbance: Secondary | ICD-10-CM | POA: Diagnosis not present

## 2016-12-29 DIAGNOSIS — F329 Major depressive disorder, single episode, unspecified: Secondary | ICD-10-CM | POA: Diagnosis not present

## 2017-01-18 ENCOUNTER — Encounter: Payer: Self-pay | Admitting: Adult Health

## 2017-01-18 ENCOUNTER — Non-Acute Institutional Stay: Payer: PPO | Admitting: Adult Health

## 2017-01-18 DIAGNOSIS — F0280 Dementia in other diseases classified elsewhere without behavioral disturbance: Secondary | ICD-10-CM | POA: Diagnosis not present

## 2017-01-18 DIAGNOSIS — I10 Essential (primary) hypertension: Secondary | ICD-10-CM | POA: Diagnosis not present

## 2017-01-18 DIAGNOSIS — G3109 Other frontotemporal dementia: Secondary | ICD-10-CM

## 2017-01-18 DIAGNOSIS — R2681 Unsteadiness on feet: Secondary | ICD-10-CM | POA: Diagnosis not present

## 2017-01-18 DIAGNOSIS — E119 Type 2 diabetes mellitus without complications: Secondary | ICD-10-CM

## 2017-01-18 DIAGNOSIS — G250 Essential tremor: Secondary | ICD-10-CM
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
## 2017-01-28 DIAGNOSIS — R509 Fever, unspecified: Secondary | ICD-10-CM | POA: Diagnosis not present

## 2017-01-29 ENCOUNTER — Encounter: Payer: Self-pay | Admitting: Adult Health

## 2017-01-29 ENCOUNTER — Non-Acute Institutional Stay: Payer: PPO | Admitting: Adult Health

## 2017-01-29 DIAGNOSIS — R509 Fever, unspecified: Secondary | ICD-10-CM | POA: Diagnosis not present

## 2017-01-29 DIAGNOSIS — T148XXA Other injury of unspecified body region, initial encounter: Secondary | ICD-10-CM

## 2017-01-29 DIAGNOSIS — F489 Nonpsychotic mental disorder, unspecified: Secondary | ICD-10-CM | POA: Diagnosis not present

## 2017-01-29 DIAGNOSIS — D649 Anemia, unspecified: Secondary | ICD-10-CM | POA: Diagnosis not present

## 2017-01-29 DIAGNOSIS — E1040 Type 1 diabetes mellitus with diabetic neuropathy, unspecified: Secondary | ICD-10-CM | POA: Diagnosis not present

## 2017-01-29 DIAGNOSIS — L03116 Cellulitis of left lower limb: Secondary | ICD-10-CM | POA: Diagnosis not present

## 2017-01-29 LAB — BASIC METABOLIC PANEL
BUN: 18 (ref 4–21)
CREATININE: 0.8 (ref 0.6–1.3)
Glucose: 192
Potassium: 3.8 (ref 3.4–5.3)
Sodium: 137 (ref 137–147)

## 2017-01-29 LAB — CBC AND DIFFERENTIAL
HCT: 34 — AB (ref 41–53)
HEMOGLOBIN: 11.3 — AB (ref 13.5–17.5)
PLATELETS: 274 (ref 150–399)
WBC: 11.2

## 2017-01-29 NOTE — Progress Notes (Signed)
Location:  [facility] psychologist of Service:  ALF (13) Provider:   Cindi Carbon, ANP [facility] [phone]   [name], DO  Patient Care Team: [name], DO as PCP - General (Geriatric Medicine)  Extended Emergency Contact Information Primary Emergency Contact: Alley,Kelly Address: [address]          [place], [place] [address] [name] of [place] Phone: [phone] Relation: Daughter Secondary Emergency Contact: Feldstein,[name] Address: [address] [place] [address] [place] Phone: [phone] Relation: Son  Code Status:  DNR Goals of care: Advanced Directive information Advanced Directives 01/18/2017  Does Patient Have a Medical Advance Directive? Yes  Type of Advance Directive Out of facility DNR (pink MOST or yellow form);[place];Living will  Does patient want to make changes to medical advance directive? -  Copy of [place] in Chart? -  Pre-existing out of facility DNR order (yellow form or pink MOST form) Yellow form placed in chart (order not valid for inpatient use)     Chief Complaint  Patient presents with  . Acute Visit    left ankle swelling and pain, also temp    HPI:  Pt is a 76 y.o. male seen today for an acute visit for left ankle swelling and pain, as well as increased temp.   ON 7/29 [facility] was notified that the resident had a temp of 102 with the above complaint. There was also concern that he may have aspirated because he was coughing and making a gurgling sound. No increased WOB or decreased 02 sats. See CXR below.  The staff report that his diet has been downgraded to NTL and ground meats.   01/28/17: CXR no active disease 01/28/17 Left foot and ankle xray: diffuse osteopenia, moderate OA changes, mild hallux valgus deformity, avulsion fracture is seen involving the medial surface of the tarsal navicular which appears to be  an old fracture. No acute deformity or dislocation seen.  He has had several falls recently. His daughter reports that he had some difficulty walking after a fall he had two weeks ago. The staff report that he was walking with a walker as usual but has overall had issues with poor balance and forgetting to use the walker for quite some time. He has had aggressive behaviors and is follow by psych for this issue and is receiving Tegretol and neurontin.  He is now a hoyer lift and sleeping more with improved behaviors.   Past Medical History:  Diagnosis Date  . Dementia   . Diabetes ([place])   . Heart disease   . Hyperlipidemia   . Hypotension    Past Surgical History:  Procedure Laterality Date  . APPENDECTOMY  1961  . CORONARY ANGIOPLASTY WITH STENT PLACEMENT  2016  . TONSILLECTOMY AND ADENOIDECTOMY    . TOTAL KNEE ARTHROPLASTY  2012    Allergies  Allergen Reactions  . Lortab [Hydrocodone-Acetaminophen] Other (See Comments)    Reaction:  Affected pts cognition   . Morphine And Related Other (See Comments)    Reaction:  Affected pts cognition     Outpatient Encounter Prescriptions as of 01/29/2017  Medication Sig  . cephALEXin (KEFLEX) 250 MG capsule Take by mouth 4 (four) times daily. X 7 days  . acetaminophen (TYLENOL) 650 MG CR tablet Take 650 mg by mouth daily.  Marland Kitchen allopurinol (ZYLOPRIM) 100 MG tablet  Take 1 tablet (100 mg total) by mouth daily.  Marland Kitchen aspirin EC 81 MG tablet Take 81 mg by mouth daily.  Marland Kitchen atorvastatin (LIPITOR) 80 MG tablet Take 1 tablet (80 mg total) by mouth daily.  . carbamazepine (TEGRETOL) 100 MG chewable tablet Chew 100 mg by mouth 2 (two) times daily. 100 mg in the am and 200 mg in the pm  . clopidogrel (PLAVIX) 75 MG tablet Take 1 tablet (75 mg total) by mouth daily.  Marland Kitchen gabapentin (NEURONTIN) 100 MG capsule Take 100 mg by mouth. 100 mg at noon and 300 mg qhs  . Melatonin 10 MG TABS Take 10 mg by mouth at bedtime.  . Memantine HCl-Donepezil HCl (NAMZARIC) 28-10  MG CP24 Take 1 capsule by mouth daily.  . metFORMIN (GLUCOPHAGE) 500 MG tablet Take 250 mg by mouth 2 (two) times daily with a meal.  . nitroGLYCERIN (NITROSTAT) 0.4 MG SL tablet Place 0.4 mg under the tongue every 5 (five) minutes as needed for chest pain.  . vitamin B-12 (CYANOCOBALAMIN) 1000 MCG tablet Take 1 tablet (1,000 mcg total) by mouth daily.   No facility-administered encounter medications on file as of 01/29/2017.     Review of Systems  Unable to perform ROS: Dementia    Immunization History  Administered Date(s) Administered  . Influenza-Unspecified 05/05/2010, 04/11/2013, 03/03/2015  . Pneumococcal Polysaccharide-23 12/25/2010  . Pneumococcal-Unspecified 02/28/2013  . Tdap 03/22/2006  . Zoster 11/01/2012   Pertinent  Health Maintenance Due  Topic Date Due  . FOOT EXAM  01/15/1951  . OPHTHALMOLOGY EXAM  01/15/1951  . COLONOSCOPY  07/31/2013  . PNA vac Low Risk Adult (2 of 2 - PCV13) 02/28/2014  . INFLUENZA VACCINE  01/31/2017  . HEMOGLOBIN A1C  04/27/2017  . URINE MICROALBUMIN  10/14/2017   Fall Risk  04/05/2016  Falls in the past year? Yes  Number falls in past yr: 1  Injury with Fall? Yes   Functional Status Survey:    Vitals:   01/29/17 1014  Weight: 157 lb 3.2 oz (71.3 kg)   Body mass index is 26.16 kg/m. Physical Exam  HENT:  Head: Normocephalic and atraumatic.  Tried to bite tongue blade  Eyes:  Flinched eyes shut and would not open for exam  Neck: Normal range of motion. Neck supple. No JVD present. No tracheal deviation present. No thyromegaly present.  Cardiovascular: Normal rate and regular rhythm.   No murmur heard. Pulmonary/Chest: Effort normal and breath sounds normal. No respiratory distress. He has no wheezes.  Abdominal: Soft. Bowel sounds are normal. He exhibits no distension. There is no tenderness.  Musculoskeletal: He exhibits edema (noted to the left foot with erythema and warmth) and tenderness.  Lymphadenopathy:    He has no  cervical adenopathy.  Neurological:  Sleepy arouses during exam. Not able to f/c.  Does not answer q's. Becomes agitated and tries to hit me during the exam.  Skin: Skin is warm and dry. He is not diaphoretic.  Psychiatric:  Lethargic, then agitated    Labs reviewed:  Recent Labs  07/27/16 1043 07/28/16 0455  11/16/16 11/17/16 01/29/17  NA 137 135  < > 149* 140 137  K 5.1 4.3  < > 4.1 3.6 3.8  CL 102 102  --   --   --   --   CO2 26 27  --   --   --   --   GLUCOSE 160* 111*  --   --   --   --  BUN 25* 25*  < > 16 11 18   CREATININE 1.09 1.09  < > 0.7 0.7 0.8  CALCIUM 9.5 9.3  --   --   --   --   < > = values in this interval not displayed.  Recent Labs  07/11/16 08/22/16  AST 14 12*  ALT 16 14  ALKPHOS 117 103    Recent Labs  07/27/16 1043 07/28/16 0455  10/26/16 11/16/16 01/29/17  WBC 11.1* 8.5  < > 6.1 9.9 11.2  HGB 10.7* 10.4*  < > 11.9* 11.3* 11.3*  HCT 32.3* 31.5*  < > 36* 35* 34*  MCV 93.4 93.5  --   --   --   --   PLT 186 192  < > 215 201 274  < > = values in this interval not displayed. Lab Results  Component Value Date   TSH 0.77 10/31/2016   Lab Results  Component Value Date   HGBA1C 6.7 10/26/2016   Lab Results  Component Value Date   CHOL 124 10/26/2016   HDL 40 10/26/2016   LDLCALC 62 10/26/2016   TRIG 111 10/26/2016    Significant Diagnostic Results in last 30 days:  No results found.  Assessment/Plan  1. Cellulitis of left lower extremity Continue Keflex to complete 7 day course, if no improvement in erythema and redness notify [facility] for additional orders  2. Avulsion fracture Age indeterminate Not a candidate for ortho referral Apply ICE 15 mint TID for 72 hrs ACE wrap for support Use hoyer lift Celebrex 200 mg BID for 7 days D/C Aspirin    Family/ staff Communication: discussed with daughter Claiborne Billings  Labs/tests ordered:  CBC due in am

## 2017-01-30 DIAGNOSIS — R509 Fever, unspecified: Secondary | ICD-10-CM | POA: Diagnosis not present

## 2017-01-30 DIAGNOSIS — F329 Major depressive disorder, single episode, unspecified: Secondary | ICD-10-CM | POA: Diagnosis not present

## 2017-01-30 DIAGNOSIS — F0280 Dementia in other diseases classified elsewhere without behavioral disturbance: Secondary | ICD-10-CM | POA: Diagnosis not present

## 2017-01-30 LAB — CBC AND DIFFERENTIAL
HCT: 32 — AB (ref 41–53)
HEMOGLOBIN: 11 — AB (ref 13.5–17.5)
PLATELETS: 287 (ref 150–399)
WBC: 10.3

## 2017-02-07 DIAGNOSIS — R262 Difficulty in walking, not elsewhere classified: Secondary | ICD-10-CM | POA: Diagnosis not present

## 2017-02-07 DIAGNOSIS — R296 Repeated falls: Secondary | ICD-10-CM | POA: Diagnosis not present

## 2017-02-07 DIAGNOSIS — R1312 Dysphagia, oropharyngeal phase: Secondary | ICD-10-CM | POA: Diagnosis not present

## 2017-02-07 DIAGNOSIS — G3109 Other frontotemporal dementia: Secondary | ICD-10-CM | POA: Diagnosis not present

## 2017-02-07 DIAGNOSIS — R488 Other symbolic dysfunctions: Secondary | ICD-10-CM | POA: Diagnosis not present

## 2017-02-07 DIAGNOSIS — R2681 Unsteadiness on feet: Secondary | ICD-10-CM | POA: Diagnosis not present

## 2017-02-08 DIAGNOSIS — R262 Difficulty in walking, not elsewhere classified: Secondary | ICD-10-CM | POA: Diagnosis not present

## 2017-02-08 DIAGNOSIS — R2681 Unsteadiness on feet: Secondary | ICD-10-CM | POA: Diagnosis not present

## 2017-02-08 DIAGNOSIS — R1312 Dysphagia, oropharyngeal phase: Secondary | ICD-10-CM | POA: Diagnosis not present

## 2017-02-08 DIAGNOSIS — R488 Other symbolic dysfunctions: Secondary | ICD-10-CM | POA: Diagnosis not present

## 2017-02-08 DIAGNOSIS — R296 Repeated falls: Secondary | ICD-10-CM | POA: Diagnosis not present

## 2017-02-08 DIAGNOSIS — G3109 Other frontotemporal dementia: Secondary | ICD-10-CM | POA: Diagnosis not present

## 2017-02-09 DIAGNOSIS — R296 Repeated falls: Secondary | ICD-10-CM | POA: Diagnosis not present

## 2017-02-09 DIAGNOSIS — R2681 Unsteadiness on feet: Secondary | ICD-10-CM | POA: Diagnosis not present

## 2017-02-09 DIAGNOSIS — R488 Other symbolic dysfunctions: Secondary | ICD-10-CM | POA: Diagnosis not present

## 2017-02-09 DIAGNOSIS — G3109 Other frontotemporal dementia: Secondary | ICD-10-CM | POA: Diagnosis not present

## 2017-02-09 DIAGNOSIS — R1312 Dysphagia, oropharyngeal phase: Secondary | ICD-10-CM | POA: Diagnosis not present

## 2017-02-09 DIAGNOSIS — R262 Difficulty in walking, not elsewhere classified: Secondary | ICD-10-CM | POA: Diagnosis not present

## 2017-02-12 DIAGNOSIS — R1312 Dysphagia, oropharyngeal phase: Secondary | ICD-10-CM | POA: Diagnosis not present

## 2017-02-12 DIAGNOSIS — R488 Other symbolic dysfunctions: Secondary | ICD-10-CM | POA: Diagnosis not present

## 2017-02-12 DIAGNOSIS — G3109 Other frontotemporal dementia: Secondary | ICD-10-CM | POA: Diagnosis not present

## 2017-02-13 DIAGNOSIS — R262 Difficulty in walking, not elsewhere classified: Secondary | ICD-10-CM | POA: Diagnosis not present

## 2017-02-13 DIAGNOSIS — R488 Other symbolic dysfunctions: Secondary | ICD-10-CM | POA: Diagnosis not present

## 2017-02-13 DIAGNOSIS — R296 Repeated falls: Secondary | ICD-10-CM | POA: Diagnosis not present

## 2017-02-13 DIAGNOSIS — R2681 Unsteadiness on feet: Secondary | ICD-10-CM | POA: Diagnosis not present

## 2017-02-13 DIAGNOSIS — R1312 Dysphagia, oropharyngeal phase: Secondary | ICD-10-CM | POA: Diagnosis not present

## 2017-02-13 DIAGNOSIS — G3109 Other frontotemporal dementia: Secondary | ICD-10-CM | POA: Diagnosis not present

## 2017-02-14 DIAGNOSIS — R296 Repeated falls: Secondary | ICD-10-CM | POA: Diagnosis not present

## 2017-02-14 DIAGNOSIS — R2681 Unsteadiness on feet: Secondary | ICD-10-CM | POA: Diagnosis not present

## 2017-02-14 DIAGNOSIS — G3109 Other frontotemporal dementia: Secondary | ICD-10-CM | POA: Diagnosis not present

## 2017-02-14 DIAGNOSIS — R262 Difficulty in walking, not elsewhere classified: Secondary | ICD-10-CM | POA: Diagnosis not present

## 2017-02-14 DIAGNOSIS — R1312 Dysphagia, oropharyngeal phase: Secondary | ICD-10-CM | POA: Diagnosis not present

## 2017-02-14 DIAGNOSIS — R488 Other symbolic dysfunctions: Secondary | ICD-10-CM | POA: Diagnosis not present

## 2017-02-15 DIAGNOSIS — R296 Repeated falls: Secondary | ICD-10-CM | POA: Diagnosis not present

## 2017-02-15 DIAGNOSIS — R2681 Unsteadiness on feet: Secondary | ICD-10-CM | POA: Diagnosis not present

## 2017-02-15 DIAGNOSIS — R262 Difficulty in walking, not elsewhere classified: Secondary | ICD-10-CM | POA: Diagnosis not present

## 2017-02-15 DIAGNOSIS — G3109 Other frontotemporal dementia: Secondary | ICD-10-CM | POA: Diagnosis not present

## 2017-02-15 DIAGNOSIS — R1312 Dysphagia, oropharyngeal phase: Secondary | ICD-10-CM | POA: Diagnosis not present

## 2017-02-15 DIAGNOSIS — R488 Other symbolic dysfunctions: Secondary | ICD-10-CM | POA: Diagnosis not present

## 2017-02-16 DIAGNOSIS — R296 Repeated falls: Secondary | ICD-10-CM | POA: Diagnosis not present

## 2017-02-16 DIAGNOSIS — R2681 Unsteadiness on feet: Secondary | ICD-10-CM | POA: Diagnosis not present

## 2017-02-16 DIAGNOSIS — R262 Difficulty in walking, not elsewhere classified: Secondary | ICD-10-CM | POA: Diagnosis not present

## 2017-02-16 DIAGNOSIS — G3109 Other frontotemporal dementia: Secondary | ICD-10-CM | POA: Diagnosis not present

## 2017-02-19 DIAGNOSIS — G3109 Other frontotemporal dementia: Secondary | ICD-10-CM | POA: Diagnosis not present

## 2017-02-19 DIAGNOSIS — R2681 Unsteadiness on feet: Secondary | ICD-10-CM | POA: Diagnosis not present

## 2017-02-19 DIAGNOSIS — R296 Repeated falls: Secondary | ICD-10-CM | POA: Diagnosis not present

## 2017-02-19 DIAGNOSIS — R262 Difficulty in walking, not elsewhere classified: Secondary | ICD-10-CM | POA: Diagnosis not present

## 2017-02-20 DIAGNOSIS — R2681 Unsteadiness on feet: Secondary | ICD-10-CM | POA: Diagnosis not present

## 2017-02-20 DIAGNOSIS — R488 Other symbolic dysfunctions: Secondary | ICD-10-CM | POA: Diagnosis not present

## 2017-02-20 DIAGNOSIS — R1312 Dysphagia, oropharyngeal phase: Secondary | ICD-10-CM | POA: Diagnosis not present

## 2017-02-20 DIAGNOSIS — R262 Difficulty in walking, not elsewhere classified: Secondary | ICD-10-CM | POA: Diagnosis not present

## 2017-02-20 DIAGNOSIS — G3109 Other frontotemporal dementia: Secondary | ICD-10-CM | POA: Diagnosis not present

## 2017-02-20 DIAGNOSIS — R296 Repeated falls: Secondary | ICD-10-CM | POA: Diagnosis not present

## 2017-02-21 DIAGNOSIS — R1312 Dysphagia, oropharyngeal phase: Secondary | ICD-10-CM | POA: Diagnosis not present

## 2017-02-21 DIAGNOSIS — G3109 Other frontotemporal dementia: Secondary | ICD-10-CM | POA: Diagnosis not present

## 2017-02-21 DIAGNOSIS — R488 Other symbolic dysfunctions: Secondary | ICD-10-CM | POA: Diagnosis not present

## 2017-02-22 DIAGNOSIS — R1312 Dysphagia, oropharyngeal phase: Secondary | ICD-10-CM | POA: Diagnosis not present

## 2017-02-22 DIAGNOSIS — R488 Other symbolic dysfunctions: Secondary | ICD-10-CM | POA: Diagnosis not present

## 2017-02-22 DIAGNOSIS — R296 Repeated falls: Secondary | ICD-10-CM | POA: Diagnosis not present

## 2017-02-22 DIAGNOSIS — G3109 Other frontotemporal dementia: Secondary | ICD-10-CM | POA: Diagnosis not present

## 2017-02-22 DIAGNOSIS — R2681 Unsteadiness on feet: Secondary | ICD-10-CM | POA: Diagnosis not present

## 2017-02-22 DIAGNOSIS — R262 Difficulty in walking, not elsewhere classified: Secondary | ICD-10-CM | POA: Diagnosis not present

## 2017-02-23 DIAGNOSIS — R488 Other symbolic dysfunctions: Secondary | ICD-10-CM | POA: Diagnosis not present

## 2017-02-23 DIAGNOSIS — R1312 Dysphagia, oropharyngeal phase: Secondary | ICD-10-CM | POA: Diagnosis not present

## 2017-02-23 DIAGNOSIS — R296 Repeated falls: Secondary | ICD-10-CM | POA: Diagnosis not present

## 2017-02-23 DIAGNOSIS — R262 Difficulty in walking, not elsewhere classified: Secondary | ICD-10-CM | POA: Diagnosis not present

## 2017-02-23 DIAGNOSIS — R2681 Unsteadiness on feet: Secondary | ICD-10-CM | POA: Diagnosis not present

## 2017-02-23 DIAGNOSIS — G3109 Other frontotemporal dementia: Secondary | ICD-10-CM | POA: Diagnosis not present

## 2017-02-25 DIAGNOSIS — R2681 Unsteadiness on feet: Secondary | ICD-10-CM | POA: Diagnosis not present

## 2017-02-25 DIAGNOSIS — R296 Repeated falls: Secondary | ICD-10-CM | POA: Diagnosis not present

## 2017-02-25 DIAGNOSIS — G3109 Other frontotemporal dementia: Secondary | ICD-10-CM | POA: Diagnosis not present

## 2017-02-25 DIAGNOSIS — R262 Difficulty in walking, not elsewhere classified: Secondary | ICD-10-CM | POA: Diagnosis not present

## 2017-02-26 DIAGNOSIS — R296 Repeated falls: Secondary | ICD-10-CM | POA: Diagnosis not present

## 2017-02-26 DIAGNOSIS — G3109 Other frontotemporal dementia: Secondary | ICD-10-CM | POA: Diagnosis not present

## 2017-02-26 DIAGNOSIS — R2681 Unsteadiness on feet: Secondary | ICD-10-CM | POA: Diagnosis not present

## 2017-02-26 DIAGNOSIS — R262 Difficulty in walking, not elsewhere classified: Secondary | ICD-10-CM | POA: Diagnosis not present

## 2017-02-26 DIAGNOSIS — R1312 Dysphagia, oropharyngeal phase: Secondary | ICD-10-CM | POA: Diagnosis not present

## 2017-02-26 DIAGNOSIS — R488 Other symbolic dysfunctions: Secondary | ICD-10-CM | POA: Diagnosis not present

## 2017-02-27 DIAGNOSIS — G3109 Other frontotemporal dementia: Secondary | ICD-10-CM | POA: Diagnosis not present

## 2017-02-27 DIAGNOSIS — R1312 Dysphagia, oropharyngeal phase: Secondary | ICD-10-CM | POA: Diagnosis not present

## 2017-02-27 DIAGNOSIS — R296 Repeated falls: Secondary | ICD-10-CM | POA: Diagnosis not present

## 2017-02-27 DIAGNOSIS — R262 Difficulty in walking, not elsewhere classified: Secondary | ICD-10-CM | POA: Diagnosis not present

## 2017-02-27 DIAGNOSIS — R2681 Unsteadiness on feet: Secondary | ICD-10-CM | POA: Diagnosis not present

## 2017-02-27 DIAGNOSIS — R488 Other symbolic dysfunctions: Secondary | ICD-10-CM | POA: Diagnosis not present

## 2017-02-28 DIAGNOSIS — G3109 Other frontotemporal dementia: Secondary | ICD-10-CM | POA: Diagnosis not present

## 2017-02-28 DIAGNOSIS — R1312 Dysphagia, oropharyngeal phase: Secondary | ICD-10-CM | POA: Diagnosis not present

## 2017-02-28 DIAGNOSIS — R488 Other symbolic dysfunctions: Secondary | ICD-10-CM | POA: Diagnosis not present

## 2017-03-01 DIAGNOSIS — F0280 Dementia in other diseases classified elsewhere without behavioral disturbance: Secondary | ICD-10-CM | POA: Diagnosis not present

## 2017-03-01 DIAGNOSIS — R1312 Dysphagia, oropharyngeal phase: Secondary | ICD-10-CM | POA: Diagnosis not present

## 2017-03-01 DIAGNOSIS — G3109 Other frontotemporal dementia: Secondary | ICD-10-CM | POA: Diagnosis not present

## 2017-03-01 DIAGNOSIS — F329 Major depressive disorder, single episode, unspecified: Secondary | ICD-10-CM | POA: Diagnosis not present

## 2017-03-01 DIAGNOSIS — R488 Other symbolic dysfunctions: Secondary | ICD-10-CM | POA: Diagnosis not present

## 2017-03-02 DIAGNOSIS — R1312 Dysphagia, oropharyngeal phase: Secondary | ICD-10-CM | POA: Diagnosis not present

## 2017-03-02 DIAGNOSIS — G3109 Other frontotemporal dementia: Secondary | ICD-10-CM | POA: Diagnosis not present

## 2017-03-02 DIAGNOSIS — R488 Other symbolic dysfunctions: Secondary | ICD-10-CM | POA: Diagnosis not present

## 2017-03-06 DIAGNOSIS — R488 Other symbolic dysfunctions: Secondary | ICD-10-CM | POA: Diagnosis not present

## 2017-03-06 DIAGNOSIS — R1312 Dysphagia, oropharyngeal phase: Secondary | ICD-10-CM | POA: Diagnosis not present

## 2017-03-06 DIAGNOSIS — G3109 Other frontotemporal dementia: Secondary | ICD-10-CM | POA: Diagnosis not present

## 2017-03-07 DIAGNOSIS — G3109 Other frontotemporal dementia: Secondary | ICD-10-CM | POA: Diagnosis not present

## 2017-03-07 DIAGNOSIS — R2681 Unsteadiness on feet: Secondary | ICD-10-CM | POA: Diagnosis not present

## 2017-03-07 DIAGNOSIS — R296 Repeated falls: Secondary | ICD-10-CM | POA: Diagnosis not present

## 2017-03-07 DIAGNOSIS — R1312 Dysphagia, oropharyngeal phase: Secondary | ICD-10-CM | POA: Diagnosis not present

## 2017-03-07 DIAGNOSIS — R262 Difficulty in walking, not elsewhere classified: Secondary | ICD-10-CM | POA: Diagnosis not present

## 2017-03-07 DIAGNOSIS — R488 Other symbolic dysfunctions: Secondary | ICD-10-CM | POA: Diagnosis not present

## 2017-03-14 DIAGNOSIS — R262 Difficulty in walking, not elsewhere classified: Secondary | ICD-10-CM | POA: Diagnosis not present

## 2017-03-14 DIAGNOSIS — R296 Repeated falls: Secondary | ICD-10-CM | POA: Diagnosis not present

## 2017-03-14 DIAGNOSIS — R2681 Unsteadiness on feet: Secondary | ICD-10-CM | POA: Diagnosis not present

## 2017-03-14 DIAGNOSIS — G3109 Other frontotemporal dementia: Secondary | ICD-10-CM | POA: Diagnosis not present

## 2017-03-15 ENCOUNTER — Non-Acute Institutional Stay: Payer: PPO | Admitting: Adult Health

## 2017-03-15 DIAGNOSIS — I119 Hypertensive heart disease without heart failure: Secondary | ICD-10-CM | POA: Diagnosis not present

## 2017-03-15 DIAGNOSIS — E119 Type 2 diabetes mellitus without complications: Secondary | ICD-10-CM

## 2017-03-15 DIAGNOSIS — F0280 Dementia in other diseases classified elsewhere without behavioral disturbance: Secondary | ICD-10-CM

## 2017-03-15 DIAGNOSIS — K5901 Slow transit constipation: Secondary | ICD-10-CM

## 2017-03-15 DIAGNOSIS — G3109 Other frontotemporal dementia: Secondary | ICD-10-CM

## 2017-03-15 DIAGNOSIS — G250 Essential tremor: Secondary | ICD-10-CM | POA: Diagnosis not present

## 2017-03-15 LAB — HM DIABETES FOOT EXAM

## 2017-03-15 NOTE — Progress Notes (Signed)
Location:  Occupational psychologist of Service:  ALF (13) Provider:   Cindi Carbon, ANP [facility] [phone]   [name], DO  Patient Care Team: [name], DO as PCP - General (Geriatric Medicine)  Extended Emergency Contact Information Primary Emergency Contact: Alley,Kelly Address: [address]          [place], [place] [address] [name] of [place] Phone: [phone] Relation: Daughter Secondary Emergency Contact: Dayton,[name] Address: [address] [place] [address] [place] Phone: [phone] Relation: Son  Code Status:  DNR Goals of care: Advanced Directive information Advanced Directives 03/15/2017  Does Patient Have a Medical Advance Directive? Yes  Type of Advance Directive Out of facility DNR (pink MOST or yellow form);[place];Living will  Does patient want to make changes to medical advance directive? -  Copy of [place] in Chart? Yes  Pre-existing out of facility DNR order (yellow form or pink MOST form) Yellow form placed in chart (order not valid for inpatient use)     Chief Complaint  Patient presents with  . Medical Management of Chronic Issues    HPI:  Pt is a 76 y.o. male seen today for medical management of chronic diseases.  He resides in memory care.  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 Dementia   . Diabetes ([place])   . Heart disease   . Hyperlipidemia   . Hypotension    Past Surgical History:  Procedure Laterality Date  . APPENDECTOMY  1961  . CORONARY ANGIOPLASTY WITH STENT PLACEMENT  2016  . TONSILLECTOMY AND ADENOIDECTOMY    . TOTAL KNEE ARTHROPLASTY  2012    Allergies  Allergen Reactions  . Lortab [Hydrocodone-Acetaminophen] Other (See Comments)    Reaction:  Affected pts cognition   . Morphine And Related Other (See Comments)    Reaction:  Affected pts cognition     Outpatient Encounter Prescriptions as of 03/15/2017  Medication Sig  . acetaminophen (TYLENOL) 650 MG CR tablet Take 650 mg by mouth daily.  Marland Kitchen allopurinol (ZYLOPRIM) 100 MG tablet Take 1 tablet (100 mg total) by mouth daily.  Marland Kitchen atorvastatin (LIPITOR) 80 MG tablet Take 1 tablet (80 mg total) by mouth daily.  . carbamazepine (TEGRETOL) 100 MG chewable tablet Chew 100 mg by mouth 2 (two) times daily. 100 mg in the am and 200 mg in the pm  . clopidogrel (PLAVIX) 75 MG  tablet Take 1 tablet (75 mg total) by mouth daily.  Marland Kitchen gabapentin (NEURONTIN) 100 MG capsule Take 100 mg by mouth at bedtime.   . Melatonin 10 MG TABS Take 10 mg by mouth at bedtime.  . Memantine HCl-Donepezil HCl (NAMZARIC) 28-10 MG CP24 Take 1 capsule by mouth daily.  . nitroGLYCERIN (NITROSTAT) 0.4 MG SL tablet Place 0.4 mg under the tongue every 5 (five) minutes as needed for chest pain.  . vitamin B-12 (CYANOCOBALAMIN) 1000 MCG tablet Take 1 tablet (1,000 mcg total) by mouth daily.  . [DISCONTINUED] aspirin EC 81 MG tablet Take 81 mg by mouth daily.  . [DISCONTINUED]  cephALEXin (KEFLEX) 250 MG capsule Take by mouth 4 (four) times daily. X 7 days  . [DISCONTINUED] metFORMIN (GLUCOPHAGE) 500 MG tablet Take 250 mg by mouth 2 (two) times daily with a meal.   No facility-administered encounter medications on file as of 03/15/2017.     Review of Systems  Unable to perform ROS: Dementia    Immunization History  Administered Date(s) Administered  . Influenza-Unspecified 05/05/2010, 04/11/2013, 03/03/2015  . Pneumococcal Polysaccharide-23 12/25/2010  . Pneumococcal-Unspecified 02/28/2013  . Tdap 03/22/2006  . Zoster 11/01/2012   Pertinent  Health Maintenance Due  Topic Date Due  . FOOT EXAM  01/15/1951  . OPHTHALMOLOGY EXAM  01/15/1951  . COLONOSCOPY  07/31/2013  . PNA vac Low Risk Adult (2 of 2 - PCV13) 02/28/2014  . INFLUENZA VACCINE  01/31/2017  . HEMOGLOBIN A1C  04/27/2017  . URINE MICROALBUMIN  10/14/2017   Fall Risk  04/05/2016  Falls in the past year? Yes  Number falls in past yr: 1  Injury with Fall? Yes   Functional Status Survey:    Vitals:   03/15/17 1455  BP: (!) 96/59  Pulse: 60  Resp: 18  Temp: (!) 96.8 F (36 C)  SpO2: 94%  Weight: 156 lb (70.8 kg)   Wt Readings from Last 3 Encounters:  03/15/17 156 lb (70.8 kg)  01/29/17 157 lb 3.2 oz (71.3 kg)  01/18/17 157 lb 3.2 oz (71.3 kg)    Physical Exam  Constitutional: No distress.  HENT:  Head: Normocephalic and atraumatic.  Neck: Normal range of motion. Neck supple. No JVD present. No tracheal deviation present. No thyromegaly present.  Cardiovascular: Normal rate and regular rhythm.   No murmur heard. Pulmonary/Chest: Effort normal and breath sounds normal. No respiratory distress. He has no wheezes.  Abdominal: Soft. Bowel sounds are normal. He exhibits no distension. There is no tenderness.  Musculoskeletal: He exhibits no edema or tenderness.  Resisted ROM  Lymphadenopathy:    He has no cervical adenopathy.  Neurological: He is alert.  Not able to answer q's,  intermittently able to follow commands. Jerking motion of legs and arms when ambulating with walker  Skin: Skin is warm and dry. He is not diaphoretic.  Psychiatric:  flat  Vitals reviewed.   Labs reviewed:  Recent Labs  07/27/16 1043 07/28/16 0455  11/16/16 11/17/16 01/29/17  NA 137 135  < > 149* 140 137  K 5.1 4.3  < > 4.1 3.6 3.8  CL 102 102  --   --   --   --   CO2 26 27  --   --   --   --   GLUCOSE 160* 111*  --   --   --   --   BUN 25* 25*  < > 16 11 18   CREATININE 1.09 1.09  < > 0.7 0.7 0.8  CALCIUM 9.5 9.3  --   --   --   --   < > = values in this interval not displayed.  Recent Labs  07/11/16 08/22/16  AST 14 12*  ALT 16 14  ALKPHOS 117 103    Recent Labs  07/27/16 1043 07/28/16 0455  11/16/16 01/29/17 01/30/17  WBC 11.1* 8.5  < > 9.9 11.2 10.3  HGB 10.7* 10.4*  < > 11.3* 11.3* 11.0*  HCT 32.3* 31.5*  < > 35* 34* 32*  MCV 93.4 93.5  --   --   --   --   PLT 186 192  < > 201 274 287  < > = values in this interval not displayed. Lab Results  Component Value Date   TSH 0.77 10/31/2016   Lab Results  Component Value Date   HGBA1C 6.7 10/26/2016   Lab Results  Component Value Date   CHOL 124 10/26/2016   HDL 40 10/26/2016   LDLCALC 62 10/26/2016   TRIG 111 10/26/2016    Significant Diagnostic Results in last 30 days:  No results found.  Assessment/Plan  1. Benign hypertensive heart disease without heart failure Controlled Continue Plavix 75 mg qd BP has been soft recently and he has not needed medication for this issue  2. Diabetes mellitus type 2 without retinopathy ([place]) Controlled without medication A1C q 6 mo  3. 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 Slow transit constipation Fleets enema x 1 if no BM Miralax 17 grams p.o.qd w/8 oz of liquid  Family/  staff Communication: discussed with staff  Labs/tests ordered:NA

## 2017-03-16 DIAGNOSIS — R2681 Unsteadiness on feet: Secondary | ICD-10-CM | POA: Diagnosis not present

## 2017-03-16 DIAGNOSIS — G3109 Other frontotemporal dementia: Secondary | ICD-10-CM | POA: Diagnosis not present

## 2017-03-16 DIAGNOSIS — R262 Difficulty in walking, not elsewhere classified: Secondary | ICD-10-CM | POA: Diagnosis not present

## 2017-03-16 DIAGNOSIS — R296 Repeated falls: Secondary | ICD-10-CM | POA: Diagnosis not present

## 2017-03-19 ENCOUNTER — Encounter: Payer: Self-pay | Admitting: Adult Health

## 2017-03-19 DIAGNOSIS — G3109 Other frontotemporal dementia: Secondary | ICD-10-CM | POA: Diagnosis not present

## 2017-03-19 DIAGNOSIS — R296 Repeated falls: Secondary | ICD-10-CM | POA: Diagnosis not present

## 2017-03-19 DIAGNOSIS — R262 Difficulty in walking, not elsewhere classified: Secondary | ICD-10-CM | POA: Diagnosis not present

## 2017-03-19 DIAGNOSIS — R2681 Unsteadiness on feet: Secondary | ICD-10-CM | POA: Diagnosis not present

## 2017-03-20 DIAGNOSIS — R2681 Unsteadiness on feet: Secondary | ICD-10-CM | POA: Diagnosis not present

## 2017-03-20 DIAGNOSIS — R262 Difficulty in walking, not elsewhere classified: Secondary | ICD-10-CM | POA: Diagnosis not present

## 2017-03-20 DIAGNOSIS — R296 Repeated falls: Secondary | ICD-10-CM | POA: Diagnosis not present

## 2017-03-20 DIAGNOSIS — G3109 Other frontotemporal dementia: Secondary | ICD-10-CM | POA: Diagnosis not present

## 2017-03-21 DIAGNOSIS — R296 Repeated falls: Secondary | ICD-10-CM | POA: Diagnosis not present

## 2017-03-21 DIAGNOSIS — R2681 Unsteadiness on feet: Secondary | ICD-10-CM | POA: Diagnosis not present

## 2017-03-21 DIAGNOSIS — G3109 Other frontotemporal dementia: Secondary | ICD-10-CM | POA: Diagnosis not present

## 2017-03-21 DIAGNOSIS — R262 Difficulty in walking, not elsewhere classified: Secondary | ICD-10-CM | POA: Diagnosis not present

## 2017-03-23 DIAGNOSIS — R296 Repeated falls: Secondary | ICD-10-CM | POA: Diagnosis not present

## 2017-03-23 DIAGNOSIS — G3109 Other frontotemporal dementia: Secondary | ICD-10-CM | POA: Diagnosis not present

## 2017-03-23 DIAGNOSIS — R262 Difficulty in walking, not elsewhere classified: Secondary | ICD-10-CM | POA: Diagnosis not present

## 2017-03-23 DIAGNOSIS — R2681 Unsteadiness on feet: Secondary | ICD-10-CM | POA: Diagnosis not present

## 2017-03-27 DIAGNOSIS — R296 Repeated falls: Secondary | ICD-10-CM | POA: Diagnosis not present

## 2017-03-27 DIAGNOSIS — G3109 Other frontotemporal dementia: Secondary | ICD-10-CM | POA: Diagnosis not present

## 2017-03-27 DIAGNOSIS — F329 Major depressive disorder, single episode, unspecified: Secondary | ICD-10-CM | POA: Diagnosis not present

## 2017-03-27 DIAGNOSIS — F0280 Dementia in other diseases classified elsewhere without behavioral disturbance: Secondary | ICD-10-CM | POA: Diagnosis not present

## 2017-03-27 DIAGNOSIS — R262 Difficulty in walking, not elsewhere classified: Secondary | ICD-10-CM | POA: Diagnosis not present

## 2017-03-27 DIAGNOSIS — R2681 Unsteadiness on feet: Secondary | ICD-10-CM | POA: Diagnosis not present

## 2017-03-29 DIAGNOSIS — R296 Repeated falls: Secondary | ICD-10-CM | POA: Diagnosis not present

## 2017-03-29 DIAGNOSIS — R2681 Unsteadiness on feet: Secondary | ICD-10-CM | POA: Diagnosis not present

## 2017-03-29 DIAGNOSIS — G3109 Other frontotemporal dementia: Secondary | ICD-10-CM | POA: Diagnosis not present

## 2017-03-29 DIAGNOSIS — R262 Difficulty in walking, not elsewhere classified: Secondary | ICD-10-CM | POA: Diagnosis not present

## 2017-04-02 DIAGNOSIS — R2681 Unsteadiness on feet: Secondary | ICD-10-CM | POA: Diagnosis not present

## 2017-04-02 DIAGNOSIS — R296 Repeated falls: Secondary | ICD-10-CM | POA: Diagnosis not present

## 2017-04-02 DIAGNOSIS — R262 Difficulty in walking, not elsewhere classified: Secondary | ICD-10-CM | POA: Diagnosis not present

## 2017-04-02 DIAGNOSIS — G3109 Other frontotemporal dementia: Secondary | ICD-10-CM | POA: Diagnosis not present

## 2017-04-09 DIAGNOSIS — F489 Nonpsychotic mental disorder, unspecified: Secondary | ICD-10-CM | POA: Diagnosis not present

## 2017-04-09 DIAGNOSIS — E1040 Type 1 diabetes mellitus with diabetic neuropathy, unspecified: Secondary | ICD-10-CM | POA: Diagnosis not present

## 2017-04-10 DIAGNOSIS — R296 Repeated falls: Secondary | ICD-10-CM | POA: Diagnosis not present

## 2017-04-10 DIAGNOSIS — G3109 Other frontotemporal dementia: Secondary | ICD-10-CM | POA: Diagnosis not present

## 2017-04-10 DIAGNOSIS — R2681 Unsteadiness on feet: Secondary | ICD-10-CM | POA: Diagnosis not present

## 2017-04-10 DIAGNOSIS — R262 Difficulty in walking, not elsewhere classified: Secondary | ICD-10-CM | POA: Diagnosis not present

## 2017-04-15 DIAGNOSIS — R2681 Unsteadiness on feet: Secondary | ICD-10-CM | POA: Diagnosis not present

## 2017-04-15 DIAGNOSIS — R296 Repeated falls: Secondary | ICD-10-CM | POA: Diagnosis not present

## 2017-04-15 DIAGNOSIS — G3109 Other frontotemporal dementia: Secondary | ICD-10-CM | POA: Diagnosis not present

## 2017-04-15 DIAGNOSIS — R262 Difficulty in walking, not elsewhere classified: Secondary | ICD-10-CM | POA: Diagnosis not present

## 2017-04-17 DIAGNOSIS — R2681 Unsteadiness on feet: Secondary | ICD-10-CM | POA: Diagnosis not present

## 2017-04-17 DIAGNOSIS — R262 Difficulty in walking, not elsewhere classified: Secondary | ICD-10-CM | POA: Diagnosis not present

## 2017-04-17 DIAGNOSIS — R296 Repeated falls: Secondary | ICD-10-CM | POA: Diagnosis not present

## 2017-04-17 DIAGNOSIS — G3109 Other frontotemporal dementia: Secondary | ICD-10-CM | POA: Diagnosis not present

## 2017-04-18 DIAGNOSIS — G3109 Other frontotemporal dementia: Secondary | ICD-10-CM | POA: Diagnosis not present

## 2017-04-18 DIAGNOSIS — R296 Repeated falls: Secondary | ICD-10-CM | POA: Diagnosis not present

## 2017-04-18 DIAGNOSIS — R2681 Unsteadiness on feet: Secondary | ICD-10-CM | POA: Diagnosis not present

## 2017-04-18 DIAGNOSIS — R262 Difficulty in walking, not elsewhere classified: Secondary | ICD-10-CM | POA: Diagnosis not present

## 2017-04-19 DIAGNOSIS — R296 Repeated falls: Secondary | ICD-10-CM | POA: Diagnosis not present

## 2017-04-19 DIAGNOSIS — G3109 Other frontotemporal dementia: Secondary | ICD-10-CM | POA: Diagnosis not present

## 2017-04-19 DIAGNOSIS — R2681 Unsteadiness on feet: Secondary | ICD-10-CM | POA: Diagnosis not present

## 2017-04-19 DIAGNOSIS — R262 Difficulty in walking, not elsewhere classified: Secondary | ICD-10-CM | POA: Diagnosis not present

## 2017-04-22 DIAGNOSIS — G3109 Other frontotemporal dementia: Secondary | ICD-10-CM | POA: Diagnosis not present

## 2017-04-22 DIAGNOSIS — R262 Difficulty in walking, not elsewhere classified: Secondary | ICD-10-CM | POA: Diagnosis not present

## 2017-04-22 DIAGNOSIS — R2681 Unsteadiness on feet: Secondary | ICD-10-CM | POA: Diagnosis not present

## 2017-04-22 DIAGNOSIS — R296 Repeated falls: Secondary | ICD-10-CM | POA: Diagnosis not present

## 2017-04-24 DIAGNOSIS — R262 Difficulty in walking, not elsewhere classified: Secondary | ICD-10-CM | POA: Diagnosis not present

## 2017-04-24 DIAGNOSIS — R2681 Unsteadiness on feet: Secondary | ICD-10-CM | POA: Diagnosis not present

## 2017-04-24 DIAGNOSIS — R296 Repeated falls: Secondary | ICD-10-CM | POA: Diagnosis not present

## 2017-04-24 DIAGNOSIS — G3109 Other frontotemporal dementia: Secondary | ICD-10-CM | POA: Diagnosis not present

## 2017-05-01 ENCOUNTER — Encounter: Payer: Self-pay | Admitting: Internal Medicine

## 2017-05-01 ENCOUNTER — Non-Acute Institutional Stay: Payer: PPO | Admitting: Internal Medicine

## 2017-05-01 DIAGNOSIS — E1149 Type 2 diabetes mellitus with other diabetic neurological complication: Secondary | ICD-10-CM | POA: Diagnosis not present

## 2017-05-01 DIAGNOSIS — F0280 Dementia in other diseases classified elsewhere without behavioral disturbance: Secondary | ICD-10-CM | POA: Diagnosis not present

## 2017-05-01 DIAGNOSIS — I25118 Atherosclerotic heart disease of native coronary artery with other forms of angina pectoris: Secondary | ICD-10-CM

## 2017-05-01 DIAGNOSIS — E1169 Type 2 diabetes mellitus with other specified complication: Secondary | ICD-10-CM

## 2017-05-01 DIAGNOSIS — E790 Hyperuricemia without signs of inflammatory arthritis and tophaceous disease: Secondary | ICD-10-CM | POA: Diagnosis not present

## 2017-05-01 DIAGNOSIS — E785 Hyperlipidemia, unspecified: Secondary | ICD-10-CM | POA: Diagnosis not present

## 2017-05-01 DIAGNOSIS — E1142 Type 2 diabetes mellitus with diabetic polyneuropathy: Secondary | ICD-10-CM

## 2017-05-01 DIAGNOSIS — I119 Hypertensive heart disease without heart failure: Secondary | ICD-10-CM

## 2017-05-01 DIAGNOSIS — G3109 Other frontotemporal dementia: Secondary | ICD-10-CM

## 2017-05-01 NOTE — Progress Notes (Signed)
Patient ID: Jose Castillo, male   DOB: 1940/12/23, 76 y.o.   MRN: [id number]  Location:  [facility] Room Number: 938 memory care Place of Service:  ALF [phone]) Provider:   [name], DO  Patient Care Team: [name], DO as PCP - General (Geriatric Medicine)  Extended Emergency Contact Information Primary Emergency Contact: Alley,Kelly Address: [address]          [place], [place] [address] [name] of [place] Phone: [phone] Relation: Daughter Secondary Emergency Contact: Sill,[name] Address: [address] [place] [address] [place] Phone: [phone] Relation: Son  Code Status:  DNR Goals of care: Advanced Directive information Advanced Directives 05/01/2017  Does Patient Have a Medical Advance Directive? Yes  Type of Advance Directive Out of facility DNR (pink MOST or yellow form);Healthcare Power of Attorney  Does patient want to make changes to medical advance directive? -  Copy of [place] in Chart? Yes  Pre-existing out of facility DNR order (yellow form or pink MOST form) Yellow form placed in chart (order not valid for inpatient use)     Chief Complaint  Patient presents with  . Medical Management of Chronic Issues    routine visit    HPI:  Pt is a 76 y.o. male seen today for medical management of chronic diseases.    When I saw him, I noted a significant decline from 3 mos back. He was resting in bed, responded only to some of my questions.  He also had difficulty tracking me with his eyes in the room, mostly with a fixed gaze.  He was more stiff/rigid than he's been.    He denied pain and appeared comfortable.  Staff had no concerns about him.  No recent changes they have noticed.  I have not seen him out and about in memory care for the past few months either.  He's spending more time in his room and in bed.  He had been having difficulty with  removing his clothes in public areas, urinating and being physically combative with staff.  He's been seen by geriatric psychiatry and is now on carbamezepine 200mg  chewable each am, his namzaric, and vistaril 25mg  at bedtime.  Also melatonin for sleep 10mg .    His gabapentin was put back that had been stopped (has diabetic neuropathy).  He's also on b12 for this and deficiency.    CAD:  Remains on lipitor 80mg , plavix.  Had MI with cath and stenting for CAD.  Has had angina in the past.    Gout:  With hyperuricemia.  Remains on allopurinol.  Past Medical History:  Diagnosis Date  . Dementia   . Diabetes ([place])   . Heart disease   . Hyperlipidemia   . Hypotension    Past Surgical History:  Procedure Laterality Date  . APPENDECTOMY  1961  . CORONARY ANGIOPLASTY WITH STENT PLACEMENT  2016  . TONSILLECTOMY AND ADENOIDECTOMY    . TOTAL KNEE ARTHROPLASTY  2012    Allergies  Allergen Reactions  . Lortab [Hydrocodone-Acetaminophen] Other (See Comments)    Reaction:  Affected pts cognition   . Morphine And Related Other (See Comments)    Reaction:  Affected pts cognition     Outpatient Encounter Prescriptions as of 05/01/2017  Medication Sig  . acetaminophen (TYLENOL) 650 MG CR tablet Take 650 mg by mouth daily.  Marland Kitchen allopurinol (ZYLOPRIM) 100  MG tablet Take 1 tablet (100 mg total) by mouth daily.  Marland Kitchen atorvastatin (LIPITOR) 80 MG tablet Take 1 tablet (80 mg total) by mouth daily.  . bisacodyl (DULCOLAX) 10 MG suppository Place 10 mg rectally as needed for moderate constipation.  . carbamazepine (TEGRETOL) 100 MG chewable tablet Chew 200 mg by mouth every morning. 400mg  at bedtime  . clopidogrel (PLAVIX) 75 MG tablet Take 1 tablet (75 mg total) by mouth daily.  Marland Kitchen gabapentin (NEURONTIN) 100 MG capsule Take 100 mg by mouth daily. Give 200mg  by mouth at bedtime  . hydrOXYzine (VISTARIL) 25 MG capsule Take 25 mg by mouth at bedtime.  . Melatonin 10 MG TABS Take 10 mg by mouth at bedtime.  .  Memantine HCl-Donepezil HCl (NAMZARIC) 28-10 MG CP24 Take 1 capsule by mouth daily.  . nitroGLYCERIN (NITROSTAT) 0.4 MG SL tablet Place 0.4 mg under the tongue every 5 (five) minutes as needed for chest pain.  . polyethylene glycol (MIRALAX / GLYCOLAX) packet Take 17 g by mouth daily.  . vitamin B-12 (CYANOCOBALAMIN) 1000 MCG tablet Take 1 tablet (1,000 mcg total) by mouth daily.   No facility-administered encounter medications on file as of 05/01/2017.     Review of Systems  Unable to perform ROS: Dementia (obtained from nursing)  Constitutional: Positive for activity change. Negative for fever.       Weight loss  HENT: Negative for congestion.   Eyes: Negative for visual disturbance.  Respiratory: Negative for chest tightness and shortness of breath.   Cardiovascular: Negative for chest pain and leg swelling.  Gastrointestinal: Negative for constipation.  Genitourinary:       Incontinence  Musculoskeletal: Positive for gait problem.       H/o back pain  Skin: Negative for color change.  Neurological: Positive for tremors and numbness.  Psychiatric/Behavioral: Positive for confusion. Negative for sleep disturbance.       Not recently having behaviors or agitation    Immunization History  Administered Date(s) Administered  . Influenza-Unspecified 05/05/2010, 04/11/2013, 03/03/2015, 09/06/2016  . Pneumococcal Polysaccharide-23 12/25/2010  . Pneumococcal-Unspecified 02/28/2013  . Tdap 03/22/2006  . Zoster 11/01/2012   Pertinent  Health Maintenance Due  Topic Date Due  . OPHTHALMOLOGY EXAM  01/15/1950  . COLONOSCOPY  07/31/2013  . PNA vac Low Risk Adult (2 of 2 - PCV13) 02/28/2014  . INFLUENZA VACCINE  01/31/2017  . HEMOGLOBIN A1C  04/27/2017  . URINE MICROALBUMIN  10/14/2017  . FOOT EXAM  03/15/2018   Fall Risk  04/05/2016  Falls in the past year? Yes  Number falls in past yr: 1  Injury with Fall? Yes   Functional Status Survey:  dependent in adls now  Vitals:    57/84/69 1204  BP: (!) 120/98  Pulse: 63  Resp: (!) 21  Temp: (!) 96.5 F (35.8 C)  TempSrc: Oral  SpO2: 98%  Weight: 141 lb (64 kg)   Body mass index is 23.46 kg/m. Physical Exam  Constitutional: He appears well-developed. No distress.  HENT:  Head: Normocephalic and atraumatic.  Eyes:  Staring blankly forward much of visit  Cardiovascular: Normal rate, regular rhythm, normal heart sounds and intact distal pulses.  Pulmonary/Chest: Effort normal and breath sounds normal. No respiratory distress.  Abdominal: Soft. Bowel sounds are normal. He exhibits no distension. There is no tenderness.  Musculoskeletal:  Rigidity of upper extremites when attempted passive ROM  Neurological:  Awake, but only responds to 2-3 of my questions, just stares for the remainder; could not follow command  to follow my finger with his eyes  Skin: Skin is warm and dry.  Psychiatric:  Masked facies    Labs reviewed:  Recent Labs  07/27/16 1043 07/28/16 0455  11/16/16 11/17/16 01/29/17  NA 137 135  < > 149* 140 137  K 5.1 4.3  < > 4.1 3.6 3.8  CL 102 102  --   --   --   --   CO2 26 27  --   --   --   --   GLUCOSE 160* 111*  --   --   --   --   BUN 25* 25*  < > 16 11 18   CREATININE 1.09 1.09  < > 0.7 0.7 0.8  CALCIUM 9.5 9.3  --   --   --   --   < > = values in this interval not displayed.  Recent Labs  07/11/16 08/22/16  AST 14 12*  ALT 16 14  ALKPHOS 117 103    Recent Labs  07/27/16 1043 07/28/16 0455  11/16/16 01/29/17 01/30/17  WBC 11.1* 8.5  < > 9.9 11.2 10.3  HGB 10.7* 10.4*  < > 11.3* 11.3* 11.0*  HCT 32.3* 31.5*  < > 35* 34* 32*  MCV 93.4 93.5  --   --   --   --   PLT 186 192  < > 201 274 287  < > = values in this interval not displayed. Lab Results  Component Value Date   TSH 0.77 10/31/2016   Lab Results  Component Value Date   HGBA1C 6.7 10/26/2016   Lab Results  Component Value Date   CHOL 124 10/26/2016   HDL 40 10/26/2016   LDLCALC 62 10/26/2016   TRIG 111  10/26/2016   Assessment/Plan 1. Frontal dementia -has declined very quickly and now minimally verbal, spending much time in bed, no longer being combative but due to treatment b/c his behaviors were becoming dangerous for him and for staff and other residents -followed by psychiatry now for this, not on antipsychotic, but wondering if rigidity is related to his medication regimen vs progression of his dementia and decreased mobility/contracture development  -would consider hospice care if his daughter is agreeable (I know she's had a lot of stressors--lost her mother recently as well) -has daily tylenol (h/o low back pain and knee pain before his dementia progression)  2. Type 2 diabetes mellitus with neurological complications (HCC) -not on medications Lab Results  Component Value Date   HGBA1C 6.7 10/26/2016  -losing weight so expect hba1c to continue to trend downward -cont gabapentin for neuropathy (could develop agitation, combativeness if hurting from neuropathic pain)  3. Diabetic peripheral neuropathy associated with type 2 diabetes mellitus (HCC) -cont gabapentin  4. Coronary artery disease of native artery of native heart with stable angina pectoris ([place]) -would recommend stopping statin and plavix at this point due to his end stage disease,minimal interaction with environment, need to discuss with his daughter and get her thoughts on this -has prn ntg for chest pain  5. Benign hypertensive heart disease without heart failure -bp well controlled, has h/o orthostatic hypotension, is off prostate meds due to it, not on bp meds anymore since weight loss   6. Hyperlipidemia associated with type 2 diabetes mellitus ([place]) -continues on statin therapy due to DMII and CAD Lab Results  Component Value Date   CHOL 124 10/26/2016   HDL 40 10/26/2016   LDLCALC 62 10/26/2016   TRIG 111 10/26/2016  -LDL  is at goal -would favor stopping statin due to lack of benefit given overall  prognosis   7. Hyperuricemia -continues on allopurinol to prevent gout flares--not on diuretics anymore so less likely--might consider stopping especially if uric acid low No results found for: [facility]  Family/ staff Communication: discussed with memory care nurse  Labs/tests ordered:  Consider uric acid to see if allopurinol worth keeping  Areanna Gengler L. Dade Rodin, D.O. [facility] [address], [place] [address] Cell Phone (Mon-Fri 8am-5pm):  [phone] On Call:  [phone] & follow prompts after 5pm & weekends Office Phone:  [phone] Office Fax:  [phone]

## 2017-05-29 ENCOUNTER — Non-Acute Institutional Stay (SKILLED_NURSING_FACILITY): Payer: PPO

## 2017-05-29 DIAGNOSIS — Z0000 Encounter for general adult medical examination without abnormal findings: Secondary | ICD-10-CM

## 2017-05-29 NOTE — Progress Notes (Signed)
Subjective:   Jose Castillo is a 76 y.o. male who presents for an Initial Medicare Annual Wellness Visit at [facility], memory care; incapacitated patient unable to answer questions appropriately    Objective:    Today's Vitals   05/29/17 1438  BP: (!) 128/52  Pulse: 62  Temp: 98 F (36.7 C)  SpO2: 95%  Weight: 141 lb (64 kg)  Height: 5\' 5"  (1.651 m)   Body mass index is 23.46 kg/m.  Current Medications (verified) Outpatient Encounter Medications as of 05/29/2017  Medication Sig  . acetaminophen (TYLENOL) 650 MG CR tablet Take 650 mg by mouth daily.  Marland Kitchen allopurinol (ZYLOPRIM) 100 MG tablet Take 1 tablet (100 mg total) by mouth daily.  Marland Kitchen atorvastatin (LIPITOR) 80 MG tablet Take 1 tablet (80 mg total) by mouth daily.  . bisacodyl (DULCOLAX) 10 MG suppository Place 10 mg rectally as needed for moderate constipation.  . carbamazepine (TEGRETOL) 100 MG chewable tablet Chew 200 mg by mouth every morning. 400mg  at bedtime  . clopidogrel (PLAVIX) 75 MG tablet Take 1 tablet (75 mg total) by mouth daily.  Marland Kitchen gabapentin (NEURONTIN) 100 MG capsule Take 100 mg by mouth daily. Give 200mg  by mouth at bedtime  . hydrOXYzine (VISTARIL) 25 MG capsule Take 25 mg by mouth at bedtime.  . Melatonin 10 MG TABS Take 10 mg by mouth at bedtime.  . Memantine HCl-Donepezil HCl (NAMZARIC) 28-10 MG CP24 Take 1 capsule by mouth daily.  . nitroGLYCERIN (NITROSTAT) 0.4 MG SL tablet Place 0.4 mg under the tongue every 5 (five) minutes as needed for chest pain.  . polyethylene glycol (MIRALAX / GLYCOLAX) packet Take 17 g by mouth daily.  . vitamin B-12 (CYANOCOBALAMIN) 1000 MCG tablet Take 1 tablet (1,000 mcg total) by mouth daily.   No facility-administered encounter medications on file as of 05/29/2017.     Allergies (verified) Lortab [hydrocodone-acetaminophen] and Morphine and related   History: Past Medical History:  Diagnosis Date  . Dementia   . Diabetes ([place])   . Heart disease    . Hyperlipidemia   . Hypotension    Past Surgical History:  Procedure Laterality Date  . APPENDECTOMY  1961  . CORONARY ANGIOPLASTY WITH STENT PLACEMENT  2016  . TONSILLECTOMY AND ADENOIDECTOMY    . TOTAL KNEE ARTHROPLASTY  2012   Family History  Problem Relation Age of Onset  . Cancer Mother 60       Lymphoma  . Heart attack Father 64  . Diabetes Sister   . Brain cancer Sister    Social History   Occupational History  . Occupation: N/A  Tobacco Use  . Smoking status: Former Smoker    Packs/day: 0.50    Years: 20.00    Pack years: 10.00    Types: Cigarettes    Last attempt to quit: 2003    Years since quitting: 15.9  . Smokeless tobacco: Never Used  Substance and Sexual Activity  . Alcohol use: No    Comment: Quit 2013  . Drug use: No  . Sexual activity: No   Tobacco Counseling Counseling given: Not Answered   Activities of Daily Living In your present state of health, do you have any difficulty performing the following activities: 05/29/2017  Hearing? N  Vision? Y  Comment diabetic retinopathy  Difficulty concentrating or making decisions? Y  Walking or climbing stairs? Y  Dressing or bathing? Y  Doing errands, shopping? Y  Preparing Food and eating ? Y  Using the  Toilet? Y  In the past six months, have you accidently leaked urine? Y  Do you have problems with loss of bowel control? Y  Managing your Medications? Y  Managing your Finances? Y  Housekeeping or managing your Housekeeping? Y  Some recent data might be hidden    Immunizations and Health Maintenance Immunization History  Administered Date(s) Administered  . Influenza-Unspecified 05/05/2010, 04/11/2013, 03/03/2015, 09/06/2016  . Pneumococcal Polysaccharide-23 12/25/2010  . Pneumococcal-Unspecified 02/28/2013  . Tdap 03/22/2006  . Zoster 11/01/2012   Health Maintenance Due  Topic Date Due  . OPHTHALMOLOGY EXAM  01[birth year]15/1951  . COLONOSCOPY  07/31/2013  . PNA vac Low Risk Adult (2 of 2 -  PCV13) 02/28/2014  . TETANUS/TDAP  03/22/2016  . INFLUENZA VACCINE  01/31/2017  . HEMOGLOBIN A1C  04/27/2017    Patient Care Team: [name], DO as PCP - General (Geriatric Medicine)  Indicate any recent Medical Services you may have received from other than [facility] providers in the past year (date may be approximate).    Assessment:   This is a routine wellness examination for Jose Castillo.   Hearing/Vision screen No exam data present  Dietary issues and exercise activities discussed: Current Exercise Habits: The patient does not participate in regular exercise at present, Exercise limited by: neurologic condition(s);orthopedic condition(s)  Goals    None     Depression Screen PHQ 2/9 Scores 05/29/2017 04/05/2016  PHQ - 2 Score - 0  Exception Documentation Medical reason -    Fall Risk Fall Risk  05/29/2017 04/05/2016  Falls in the past year? No Yes  Number falls in past yr: - 1  Injury with Fall? - Yes    Cognitive Function: MMSE - Mini Mental State Exam 05/29/2017 08/30/2016  Not completed: Unable to complete -  Orientation to time - 3  Orientation to Place - 3  Registration - 3  Attention/ Calculation - 1  Recall - 1  Language- name 2 objects - 2  Language- repeat - 1  Language- follow 3 step command - 3  Language- read & follow direction - 1  Write a sentence - 1  Copy design - 0  Total score - 19        Screening Tests Health Maintenance  Topic Date Due  . OPHTHALMOLOGY EXAM  01[birth year]15/1950  . COLONOSCOPY  07/31/2013  . PNA vac Low Risk Adult (2 of 2 - PCV13) 02/28/2014  . TETANUS/TDAP  03/22/2016  . INFLUENZA VACCINE  01/31/2017  . HEMOGLOBIN A1C  04/27/2017  . URINE MICROALBUMIN  10/14/2017  . FOOT EXAM  03/15/2018        Plan:    I have personally reviewed and addressed the Medicare Annual Wellness questionnaire and have noted the following in the patient's chart:  A. Medical and social history B. Use of alcohol, tobacco or illicit drugs   C. Current medications and supplements D. Functional ability and status E.  Nutritional status F.  Physical activity G. Advance directives H. List of other physicians I.  Hospitalizations, surgeries, and ER visits in previous 12 months J.  [place] to include hearing, vision, cognitive, depression L. Referrals and appointments - none  In addition, I am unable to review and discuss with incapacitated patient certain preventive protocols, quality metrics, and best practice recommendations. A written personalized care plan for preventive services as well as general preventive health recommendations were provided to patient.   See attached scanned questionnaire for additional information.   Signed,   Clarise Cruz  [place], English as a second language teacher Health Advisor   Quick Notes   Health Maintenance: tdap due-ordered. hga1c due. Diabetic eye exam due, ordered     Abnormal Screen: Unable to complete mental exam     Patient Concerns: None     Nurse Concerns: None

## 2017-05-29 NOTE — Patient Instructions (Signed)
Jose Castillo , Thank you for taking time to come for your Medicare Wellness Visit. I appreciate your ongoing commitment to your health goals. Please review the following plan we discussed and let me know if I can assist you in the future.   Screening recommendations/referrals: Colonoscopy excluded, pt is over age 77 Recommended yearly ophthalmology/optometry visit for glaucoma screening and checkup Recommended yearly dental visit for hygiene and checkup  Vaccinations: Influenza vaccine up to date. Due 2019 season Pneumococcal vaccine up to date Tdap vaccine due, ordered Shingles vaccine not in records    Advanced directives: In Chart  Conditions/risks identified: none  Next appointment: Dr. Mariea Clonts makes rounds  Preventive Care 68 Years and Older, Male Preventive care refers to lifestyle choices and visits with your health care provider that can promote health and wellness. What does preventive care include?  A yearly physical exam. This is also called an annual well check.  Dental exams once or twice a year.  Routine eye exams. Ask your health care provider how often you should have your eyes checked.  Personal lifestyle choices, including:  Daily care of your teeth and gums.  Regular physical activity.  Eating a healthy diet.  Avoiding tobacco and drug use.  Limiting alcohol use.  Practicing safe sex.  Taking low doses of aspirin every day.  Taking vitamin and mineral supplements as recommended by your health care provider. What happens during an annual well check? The services and screenings done by your health care provider during your annual well check will depend on your age, overall health, lifestyle risk factors, and family history of disease. Counseling  Your health care provider may ask you questions about your:  Alcohol use.  Tobacco use.  Drug use.  Emotional well-being.  Home and relationship well-being.  Sexual activity.  Eating  habits.  History of falls.  Memory and ability to understand (cognition).  Work and work Statistician. Screening  You may have the following tests or measurements:  Height, weight, and BMI.  Blood pressure.  Lipid and cholesterol levels. These may be checked every 5 years, or more frequently if you are over 10 years old.  Skin check.  Lung cancer screening. You may have this screening every year starting at age 16 if you have a 30-pack-year history of smoking and currently smoke or have quit within the past 15 years.  Fecal occult blood test (FOBT) of the stool. You may have this test every year starting at age 69.  Flexible sigmoidoscopy or colonoscopy. You may have a sigmoidoscopy every 5 years or a colonoscopy every 10 years starting at age 30.  Prostate cancer screening. Recommendations will vary depending on your family history and other risks.  Hepatitis C blood test.  Hepatitis B blood test.  Sexually transmitted disease (STD) testing.  Diabetes screening. This is done by checking your blood sugar (glucose) after you have not eaten for a while (fasting). You may have this done every 1-3 years.  Abdominal aortic aneurysm (AAA) screening. You may need this if you are a current or former smoker.  Osteoporosis. You may be screened starting at age 19 if you are at high risk. Talk with your health care provider about your test results, treatment options, and if necessary, the need for more tests. Vaccines  Your health care provider may recommend certain vaccines, such as:  Influenza vaccine. This is recommended every year.  Tetanus, diphtheria, and acellular pertussis (Tdap, Td) vaccine. You may need a Td booster every 10  years.  Zoster vaccine. You may need this after age 94.  Pneumococcal 13-valent conjugate (PCV13) vaccine. One dose is recommended after age 76.  Pneumococcal polysaccharide (PPSV23) vaccine. One dose is recommended after age 64. Talk to your health  care provider about which screenings and vaccines you need and how often you need them. This information is not intended to replace advice given to you by your health care provider. Make sure you discuss any questions you have with your health care provider. Document Released: 07/16/2015 Document Revised: 03/08/2016 Document Reviewed: 04/20/2015 Elsevier Interactive Patient Education  2017 [place] Prevention in the Home Falls can cause injuries. They can happen to people of all ages. There are many things you can do to make your home safe and to help prevent falls. What can I do on the outside of my home?  Regularly fix the edges of walkways and driveways and fix any cracks.  Remove anything that might make you trip as you walk through a door, such as a raised step or threshold.  Trim any bushes or trees on the path to your home.  Use bright outdoor lighting.  Clear any walking paths of anything that might make someone trip, such as rocks or tools.  Regularly check to see if handrails are loose or broken. Make sure that both sides of any steps have handrails.  Any raised decks and porches should have guardrails on the edges.  Have any leaves, snow, or ice cleared regularly.  Use sand or salt on walking paths during winter.  Clean up any spills in your garage right away. This includes oil or grease spills. What can I do in the bathroom?  Use night lights.  Install grab bars by the toilet and in the tub and shower. Do not use towel bars as grab bars.  Use non-skid mats or decals in the tub or shower.  If you need to sit down in the shower, use a plastic, non-slip stool.  Keep the floor dry. Clean up any water that spills on the floor as soon as it happens.  Remove soap buildup in the tub or shower regularly.  Attach bath mats securely with double-sided non-slip rug tape.  Do not have throw rugs and other things on the floor that can make you trip. What can I do  in the bedroom?  Use night lights.  Make sure that you have a light by your bed that is easy to reach.  Do not use any sheets or blankets that are too big for your bed. They should not hang down onto the floor.  Have a firm chair that has side arms. You can use this for support while you get dressed.  Do not have throw rugs and other things on the floor that can make you trip. What can I do in the kitchen?  Clean up any spills right away.  Avoid walking on wet floors.  Keep items that you use a lot in easy-to-reach places.  If you need to reach something above you, use a strong step stool that has a grab bar.  Keep electrical cords out of the way.  Do not use floor polish or wax that makes floors slippery. If you must use wax, use non-skid floor wax.  Do not have throw rugs and other things on the floor that can make you trip. What can I do with my stairs?  Do not leave any items on the stairs.  Make sure that  there are handrails on both sides of the stairs and use them. Fix handrails that are broken or loose. Make sure that handrails are as long as the stairways.  Check any carpeting to make sure that it is firmly attached to the stairs. Fix any carpet that is loose or worn.  Avoid having throw rugs at the top or bottom of the stairs. If you do have throw rugs, attach them to the floor with carpet tape.  Make sure that you have a light switch at the top of the stairs and the bottom of the stairs. If you do not have them, ask someone to add them for you. What else can I do to help prevent falls?  Wear shoes that:  Do not have high heels.  Have rubber bottoms.  Are comfortable and fit you well.  Are closed at the toe. Do not wear sandals.  If you use a stepladder:  Make sure that it is fully opened. Do not climb a closed stepladder.  Make sure that both sides of the stepladder are locked into place.  Ask someone to hold it for you, if possible.  Clearly mark  and make sure that you can see:  Any grab bars or handrails.  First and last steps.  Where the edge of each step is.  Use tools that help you move around (mobility aids) if they are needed. These include:  Canes.  Walkers.  Scooters.  Crutches.  Turn on the lights when you go into a dark area. Replace any light bulbs as soon as they burn out.  Set up your furniture so you have a clear path. Avoid moving your furniture around.  If any of your floors are uneven, fix them.  If there are any pets around you, be aware of where they are.  Review your medicines with your doctor. Some medicines can make you feel dizzy. This can increase your chance of falling. Ask your doctor what other things that you can do to help prevent falls. This information is not intended to replace advice given to you by your health care provider. Make sure you discuss any questions you have with your health care provider. Document Released: 04/15/2009 Document Revised: 11/25/2015 Document Reviewed: 07/24/2014 Elsevier Interactive Patient Education  2017 [employer].

## 2017-05-31 DIAGNOSIS — F0280 Dementia in other diseases classified elsewhere without behavioral disturbance: Secondary | ICD-10-CM | POA: Diagnosis not present

## 2017-05-31 DIAGNOSIS — F329 Major depressive disorder, single episode, unspecified: Secondary | ICD-10-CM | POA: Diagnosis not present

## 2017-06-22 ENCOUNTER — Non-Acute Institutional Stay: Payer: PPO | Admitting: Adult Health

## 2017-06-22 ENCOUNTER — Encounter: Payer: Self-pay | Admitting: Adult Health

## 2017-06-22 DIAGNOSIS — R634 Abnormal weight loss: Secondary | ICD-10-CM | POA: Diagnosis not present

## 2017-06-22 DIAGNOSIS — G3109 Other frontotemporal dementia: Secondary | ICD-10-CM | POA: Diagnosis not present

## 2017-06-22 DIAGNOSIS — R269 Unspecified abnormalities of gait and mobility: Secondary | ICD-10-CM

## 2017-06-22 DIAGNOSIS — K5901 Slow transit constipation: Secondary | ICD-10-CM | POA: Diagnosis not present

## 2017-06-22 DIAGNOSIS — E790 Hyperuricemia without signs of inflammatory arthritis and tophaceous disease: Secondary | ICD-10-CM

## 2017-06-22 DIAGNOSIS — I25118 Atherosclerotic heart disease of native coronary artery with other forms of angina pectoris: Secondary | ICD-10-CM | POA: Diagnosis not present

## 2017-06-22 DIAGNOSIS — E119 Type 2 diabetes mellitus without complications: Secondary | ICD-10-CM

## 2017-06-22 DIAGNOSIS — F0280 Dementia in other diseases classified elsewhere without behavioral disturbance: Secondary | ICD-10-CM

## 2017-06-22 DIAGNOSIS — K5900 Constipation, unspecified: Secondary | ICD-10-CM

## 2017-06-22 HISTORY — DX: Constipation, unspecified: K59.00

## 2017-06-22 NOTE — Assessment & Plan Note (Signed)
Due to poor intake related to dementia Add nutritional supplement for qd

## 2017-06-22 NOTE — Assessment & Plan Note (Signed)
Due to his advanced dementia with progressive decline and weight loss he does not benefit from lipitor so this will be discontinued.

## 2017-06-22 NOTE — Assessment & Plan Note (Addendum)
Significant decline in the last year. He is dependent on staff for all ADLs and therefore does not benefit from [place]. His behaviors have improved but he continues to resist personal care.  A tegretol level and other monitoring labs are due while on this med. He is followed by Dr. Casimiro Needle

## 2017-06-22 NOTE — Assessment & Plan Note (Signed)
Needs uric acid level No acute issues

## 2017-06-22 NOTE — Assessment & Plan Note (Signed)
Continue miralax 17 grams qd 

## 2017-06-22 NOTE — Progress Notes (Signed)
Location:  Occupational psychologist of Service:  ALF (13) Provider:   Cindi Carbon, ANP [place] [phone]  [name], DO  Patient Care Team: [name], DO as PCP - General (Geriatric Medicine)  Extended Emergency Contact Information Primary Emergency Contact: Alley,Kelly Address: [address]          [place], [place] [address] [name] of [place] Phone: [phone] Relation: Daughter Secondary Emergency Contact: Mccue,[name] Address: [address] [place] [address] [place] Phone: [phone] Relation: Son  Code Status:  DNR Goals of care: Advanced Directive information Advanced Directives 05/29/2017  Does Patient Have a Medical Advance Directive? No  Type of Advance Directive Out of facility DNR (pink MOST or yellow form);Healthcare Power of Attorney  Does patient want to make changes to medical advance directive? -  Copy of [place] in Chart? Yes  Pre-existing out of facility DNR order (yellow form or pink MOST form) Yellow form placed in chart (order not valid for inpatient use);Pink MOST form placed in chart (order not valid for inpatient use)     Chief Complaint  Patient presents with  . Medical Management of Chronic Issues    HPI:  Pt is a 76 y.o. male seen today for medical management of chronic diseases.  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 He is minimally verbal now and not ambulatory.   Weight loss: decreased intake due to advancing dementia weight 145lbs, was in the 160's earlier in the year  Frontal dementia: progressive decline, dependent for all ADLs Tegretol level not done  Constipation: regular BMs on miralax  CAD: on plavix and lipitor, no symptoms Hx of angioplasty with stent  Gout:no attacks on allopurinol  Gait abnormality: uses hoyer for transfers  DM II: CBGs 123-137 Lab Results  Component Value Date   HGBA1C 6.7 10/26/2016  Due to combativeness does not attend eye doc apts   Past Medical History:  Diagnosis Date  . Dementia   . Diabetes ([place])   . Heart disease   . Hyperlipidemia   . Hypotension    Past Surgical History:  Procedure Laterality Date  . APPENDECTOMY  1961  . CORONARY ANGIOPLASTY WITH STENT PLACEMENT  2016  . TONSILLECTOMY AND ADENOIDECTOMY    . TOTAL KNEE ARTHROPLASTY  2012    Allergies  Allergen Reactions  . Lortab [Hydrocodone-Acetaminophen] Other (See Comments)    Reaction:  Affected pts cognition   . Morphine And Related Other (See Comments)    Reaction:  Affected pts cognition     Outpatient Encounter Medications as of 06/22/2017  Medication Sig  . acetaminophen (TYLENOL) 650 MG CR tablet Take 650 mg by mouth daily.  Marland Kitchen allopurinol (ZYLOPRIM) 100 MG tablet Take 1 tablet (100 mg total) by mouth daily.  Marland Kitchen atorvastatin (LIPITOR) 80 MG tablet Take 1 tablet (80 mg total) by mouth daily.  . bisacodyl (DULCOLAX) 10 MG suppository Place 10 mg rectally as needed for moderate constipation.  . carbamazepine (TEGRETOL) 100 MG chewable tablet Chew 200 mg by mouth every morning. 400mg  at bedtime  . clopidogrel (PLAVIX) 75 MG tablet Take 1 tablet (75 mg total) by mouth daily.  Marland Kitchen gabapentin (NEURONTIN) 100 MG capsule Take 100 mg by mouth daily. Give 200mg  by mouth at bedtime  . hydrOXYzine (VISTARIL) 25 MG capsule Take 25 mg by mouth at bedtime.  . Melatonin  10 MG TABS Take 10 mg by mouth at bedtime.  . Memantine HCl-Donepezil HCl (NAMZARIC) 28-10 MG CP24 Take 1 capsule by mouth daily.  . nitroGLYCERIN (NITROSTAT) 0.4 MG SL tablet Place 0.4 mg under the tongue every 5 (five) minutes as needed for chest pain.  . polyethylene glycol (MIRALAX / GLYCOLAX) packet Take 17 g by mouth daily.  . vitamin B-12 (CYANOCOBALAMIN) 1000 MCG tablet Take 1 tablet (1,000 mcg total) by mouth daily.   No facility-administered encounter medications on file as of 06/22/2017.     Review of  Systems  Unable to perform ROS: Dementia    Immunization History  Administered Date(s) Administered  . Influenza-Unspecified 05/05/2010, 04/11/2013, 03/03/2015, 09/06/2016, 05/10/2017  . Pneumococcal Polysaccharide-23 12/25/2010  . Pneumococcal-Unspecified 02/28/2013  . Tdap 03/22/2006  . Zoster 11/01/2012   Pertinent  Health Maintenance Due  Topic Date Due  . HEMOGLOBIN A1C  04/27/2017  . OPHTHALMOLOGY EXAM  06/22/2018 (Originally 01/15/1951)  . URINE MICROALBUMIN  10/14/2017  . FOOT EXAM  03/15/2018  . INFLUENZA VACCINE  Completed  . PNA vac Low Risk Adult  Completed  . COLONOSCOPY  Discontinued   Fall Risk  05/29/2017 04/05/2016  Falls in the past year? No Yes  Number falls in past yr: - 1  Injury with Fall? - Yes   Functional Status Survey:    Vitals:   06/22/17 1030  BP: 107/67  Pulse: 82  Resp: 20  Temp: 98 F (36.7 C)  SpO2: 96%  Weight: 145 lb 14.4 oz (66.2 kg)   Body mass index is 24.28 kg/m. Physical Exam  Constitutional: No distress.  HENT:  Head: Normocephalic and atraumatic.  Nose: Nose normal.  Refuses to open his mouth or eyes  Eyes: Pupils are equal, round, and reactive to light. Right eye exhibits no discharge. Left eye exhibits no discharge.  Neck: No JVD present. No tracheal deviation present. No thyromegaly present.  Cardiovascular: Normal rate and regular rhythm.  No murmur heard. Pulmonary/Chest: Effort normal and breath sounds normal. No respiratory distress. He has no wheezes.  Abdominal: Soft. Bowel sounds are normal. He exhibits no distension. There is no tenderness.  Musculoskeletal:  Rigidity to both elbows and knees, resists  Lymphadenopathy:    He has no cervical adenopathy.  Neurological: No cranial nerve deficit.  Sleepy but arouses on assessment. Not able to follow commands, does not speak. Resist.  Skin: Skin is warm and dry. He is not diaphoretic.  Vitals reviewed.   Labs reviewed: Recent Labs    07/27/16 1043  07/28/16 0455  11/16/16 11/17/16 01/29/17  NA 137 135   < > 149* 140 137  K 5.1 4.3   < > 4.1 3.6 3.8  CL 102 102  --   --   --   --   CO2 26 27  --   --   --   --   GLUCOSE 160* 111*  --   --   --   --   BUN 25* 25*   < > 16 11 18   CREATININE 1.09 1.09   < > 0.7 0.7 0.8  CALCIUM 9.5 9.3  --   --   --   --    < > = values in this interval not displayed.   Recent Labs    07/11/16 08/22/16  AST 14 12*  ALT 16 14  ALKPHOS 117 103   Recent Labs    07/27/16 1043 07/28/16 0455  11/16/16 01/29/17 01/30/17  WBC 11.1* 8.5   < >  9.9 11.2 10.3  HGB 10.7* 10.4*   < > 11.3* 11.3* 11.0*  HCT 32.3* 31.5*   < > 35* 34* 32*  MCV 93.4 93.5  --   --   --   --   PLT 186 192   < > 201 274 287   < > = values in this interval not displayed.   Lab Results  Component Value Date   TSH 0.77 10/31/2016   Lab Results  Component Value Date   HGBA1C 6.7 10/26/2016   Lab Results  Component Value Date   CHOL 124 10/26/2016   HDL 40 10/26/2016   LDLCALC 62 10/26/2016   TRIG 111 10/26/2016    Significant Diagnostic Results in last 30 days:  No results found.  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 He is dependent on staff for all ADLs and therefore does not benefit from [place]. His behaviors have improved but he continues to resist personal care.  A tegretol level and other monitoring labs are due while on this med. He is followed by Dr. Casimiro Needle  Weight loss Due to poor intake related to dementia Add nutritional supplement for qd   Gait abnormality Hoyer lift for transfers due to weakness related to his dementia.  Hyperuricemia Needs uric acid level No acute issues  Constipation Continue miralax 17 grams qd  Coronary artery disease of native artery of native heart with stable angina pectoris ([place]) Due to his advanced dementia with  progressive decline and weight loss he does not benefit from lipitor so this will be discontinued.    Family/ staff Communication: discussed with nurse  Labs/tests ordered:  A1C BMP CBC LFTs tegretol level

## 2017-06-22 NOTE — Assessment & Plan Note (Signed)
Diet controlled Check A1C CBGS within acceptable range

## 2017-06-22 NOTE — Assessment & Plan Note (Signed)
Hoyer lift for transfers due to weakness related to his dementia.

## 2017-06-23 DIAGNOSIS — Z79899 Other long term (current) drug therapy: Secondary | ICD-10-CM | POA: Diagnosis not present

## 2017-06-23 DIAGNOSIS — E119 Type 2 diabetes mellitus without complications: Secondary | ICD-10-CM | POA: Diagnosis not present

## 2017-06-23 DIAGNOSIS — E1165 Type 2 diabetes mellitus with hyperglycemia: Secondary | ICD-10-CM | POA: Diagnosis not present

## 2017-06-23 DIAGNOSIS — D649 Anemia, unspecified: Secondary | ICD-10-CM | POA: Diagnosis not present

## 2017-06-23 DIAGNOSIS — E785 Hyperlipidemia, unspecified: Secondary | ICD-10-CM | POA: Diagnosis not present

## 2017-06-23 DIAGNOSIS — Z5181 Encounter for therapeutic drug level monitoring: Secondary | ICD-10-CM | POA: Diagnosis not present

## 2017-06-23 DIAGNOSIS — M1000 Idiopathic gout, unspecified site: Secondary | ICD-10-CM | POA: Diagnosis not present

## 2017-06-23 LAB — HEPATIC FUNCTION PANEL
ALT: 16 (ref 10–40)
AST: 12 — AB (ref 14–40)
Alkaline Phosphatase: 171 — AB (ref 25–125)
BILIRUBIN, TOTAL: 0.2

## 2017-06-23 LAB — CBC AND DIFFERENTIAL
HEMATOCRIT: 39 — AB (ref 41–53)
Hemoglobin: 12.9 — AB (ref 13.5–17.5)
PLATELETS: 194 (ref 150–399)
WBC: 7.5

## 2017-06-23 LAB — BASIC METABOLIC PANEL
BUN: 16 (ref 4–21)
Creatinine: 0.9 (ref 0.6–1.3)
GLUCOSE: 112
Potassium: 4.3 (ref 3.4–5.3)
Sodium: 142 (ref 137–147)

## 2017-06-23 LAB — HEMOGLOBIN A1C: HEMOGLOBIN A1C: 6.8

## 2017-09-07 ENCOUNTER — Encounter: Payer: Self-pay | Admitting: Adult Health

## 2017-09-07 ENCOUNTER — Non-Acute Institutional Stay: Payer: PPO | Admitting: Adult Health

## 2017-09-07 DIAGNOSIS — F0280 Dementia in other diseases classified elsewhere without behavioral disturbance: Secondary | ICD-10-CM | POA: Diagnosis not present

## 2017-09-07 DIAGNOSIS — K5901 Slow transit constipation: Secondary | ICD-10-CM

## 2017-09-07 DIAGNOSIS — B9789 Other viral agents as the cause of diseases classified elsewhere: Secondary | ICD-10-CM | POA: Diagnosis not present

## 2017-09-07 DIAGNOSIS — G4483 Primary cough headache: Secondary | ICD-10-CM | POA: Diagnosis not present

## 2017-09-07 DIAGNOSIS — G3109 Other frontotemporal dementia: Secondary | ICD-10-CM

## 2017-09-07 DIAGNOSIS — E119 Type 2 diabetes mellitus without complications: Secondary | ICD-10-CM

## 2017-09-07 DIAGNOSIS — E790 Hyperuricemia without signs of inflammatory arthritis and tophaceous disease: Secondary | ICD-10-CM | POA: Diagnosis not present

## 2017-09-07 DIAGNOSIS — J069 Acute upper respiratory infection, unspecified: Secondary | ICD-10-CM

## 2017-09-07 NOTE — Addendum Note (Signed)
Addended by: Barnie Mort on: 09/07/2017 12:58 PM   Modules accepted: Level of Service

## 2017-09-07 NOTE — Assessment & Plan Note (Signed)
Continue allopurinol 100 mg qd 

## 2017-09-07 NOTE — Assessment & Plan Note (Signed)
Diet controlled. Continue to monitor A1C periodically. Goal A1C <8%

## 2017-09-07 NOTE — Progress Notes (Addendum)
Location:  Occupational psychologist of Service:  ALF (13) Provider:   Cindi Carbon, ANP [place] [phone]  [name], DO  Patient Care Team: [name], DO as PCP - General (Geriatric Medicine)  Extended Emergency Contact Information Primary Emergency Contact: Alley,Kelly Address: [address]          [place], [place] [address] [name] of [place] Phone: [phone] Relation: Daughter Secondary Emergency Contact: Pflum,[name] Address: [address] [place] [address] [place] of [place] Phone: [phone] Relation: Son  Code Status:  DNR Goals of care: Advanced Directive information Advanced Directives 05/29/2017  Does Patient Have a Medical Advance Directive? No  Type of Advance Directive Out of facility DNR (pink MOST or yellow form);Healthcare Power of Attorney  Does patient want to make changes to medical advance directive? -  Copy of [place] in Chart? Yes  Pre-existing out of facility DNR order (yellow form or pink MOST form) Yellow form placed in chart (order not valid for inpatient use);Pink MOST form placed in chart (order not valid for inpatient use)     Chief Complaint  Patient presents with  . Acute Visit    cough, lethargy    HPI:  Pt is a 77 y.o. Castillo seen today for an acute visit for lethargy, cough, and congestion present for 1 day. On 3/7 in the afternoon he appeared sleepy and was put to bed early. This morning he remained lethargic and did not eat breakfast or take pills. He has a wet sounding cough but no sputum production. Sats are 94% and temp 97.2.  He has advanced dementia and can not contribute to the hx. Later on the morning he became alert and seemed to be closer to baseline. The nurse reports that he has not had a BM since 3/4.  Dementia: staff report that he sleeps more during the day but can still be combative with personal care.  Currently he  is on tegretol, last level 9.5 06/23/17.  He is not able to fill out a MMSE test Dependent on staff for all ADL's, incontinent and a hoyer lift  DM VO:JJKK controlled Lab Results  Component Value Date   HGBA1C 6.8 06/23/2017   Gout: no new issues Last uric acid level 4.3 06/23/17    Past Medical History:  Diagnosis Date  . Dementia   . Diabetes ([place])   . Heart disease   . Hyperlipidemia   . Hypotension    Past Surgical History:  Procedure Laterality Date  . APPENDECTOMY  1961  . CORONARY ANGIOPLASTY WITH STENT PLACEMENT  2016  . TONSILLECTOMY AND ADENOIDECTOMY    . TOTAL KNEE ARTHROPLASTY  2012    Allergies  Allergen Reactions  . Lortab [Hydrocodone-Acetaminophen] Other (See Comments)    Reaction:  Affected pts cognition   . Morphine And Related Other (See Comments)    Reaction:  Affected pts cognition     Outpatient Encounter Medications as of 09/07/2017  Medication Sig  . acetaminophen (TYLENOL) 650 MG CR tablet Take 650 mg by mouth daily.  Marland Kitchen allopurinol (ZYLOPRIM) 100 MG tablet Take 1 tablet (100 mg total) by mouth daily.  . bisacodyl (DULCOLAX) 10 MG suppository Place 10 mg rectally as needed for moderate constipation.  . carbamazepine (TEGRETOL) 100 MG chewable tablet Chew 200 mg by mouth every morning. 400mg  at bedtime  . clopidogrel (PLAVIX) 75 MG tablet  Take 1 tablet (75 mg total) by mouth daily.  Marland Kitchen gabapentin (NEURONTIN) 100 MG capsule Take 100 mg by mouth daily. Give 200mg  by mouth at bedtime  . hydrOXYzine (VISTARIL) 25 MG capsule Take 25 mg by mouth at bedtime.  . Melatonin 10 MG TABS Take 10 mg by mouth at bedtime.  . nitroGLYCERIN (NITROSTAT) 0.4 MG SL tablet Place 0.4 mg under the tongue every 5 (five) minutes as needed for chest pain.  . polyethylene glycol (MIRALAX / GLYCOLAX) packet Take 17 g by mouth daily.  . vitamin B-12 (CYANOCOBALAMIN) 1000 MCG tablet Take 1 tablet (1,000 mcg total) by mouth daily.  . [DISCONTINUED] atorvastatin (LIPITOR) 80 MG  tablet Take 1 tablet (80 mg total) by mouth daily.  . [DISCONTINUED] Memantine HCl-Donepezil HCl (NAMZARIC) 28-10 MG CP24 Take 1 capsule by mouth daily.   No facility-administered encounter medications on file as of 09/07/2017.     Review of Systems  Constitutional: Positive for activity change and appetite change. Negative for chills, diaphoresis, fatigue, fever and unexpected weight change.  HENT: Positive for congestion. Negative for trouble swallowing and voice change.   Respiratory: Positive for cough. Negative for shortness of breath, wheezing and stridor.   Cardiovascular: Negative for chest pain, palpitations and leg swelling.  Gastrointestinal: Negative for abdominal distention, abdominal pain, constipation and diarrhea.  Genitourinary: Negative for difficulty urinating and dysuria.  Musculoskeletal: Positive for gait problem. Negative for arthralgias, back pain, joint swelling and myalgias.  Hematological: Negative for adenopathy. Does not bruise/bleed easily.  Psychiatric/Behavioral: Positive for agitation and confusion. Negative for behavioral problems.    Immunization History  Administered Date(s) Administered  . Influenza-Unspecified 05/05/2010, 04/11/2013, 03/03/2015, 09/06/2016, 05/10/2017  . Pneumococcal Polysaccharide-23 12/25/2010  . Pneumococcal-Unspecified 02/28/2013  . Tdap 03/22/2006  . Zoster 11/01/2012   Pertinent  Health Maintenance Due  Topic Date Due  . HEMOGLOBIN A1C  04/27/2017  . OPHTHALMOLOGY EXAM  06/22/2018 (Originally 01/15/1951)  . URINE MICROALBUMIN  10/14/2017  . FOOT EXAM  03/15/2018  . INFLUENZA VACCINE  Completed  . PNA vac Low Risk Adult  Completed  . COLONOSCOPY  Discontinued   Fall Risk  05/29/2017 04/05/2016  Falls in the past year? No Yes  Number falls in past yr: - 1  Injury with Fall? - Yes   Functional Status Survey:    Vitals:   09/07/17 1047  BP: 96/69  Pulse: (!) 54  Resp: 20  Temp: (!) 97.2 F (36.2 C)  SpO2: 94%    Weight: 150 lb 4.8 oz (68.2 kg)   Body mass index is 25.01 kg/m.  Wt Readings from Last 3 Encounters:  09/07/17 150 lb 4.8 oz (68.2 kg)  06/22/17 145 lb 14.4 oz (66.2 kg)  05/29/17 141 lb (64 kg)    Physical Exam  Constitutional: No distress.  HENT:  Head: Normocephalic and atraumatic.  Right Ear: External ear normal.  Left Ear: External ear normal.  Nose: Nose normal.  Mouth/Throat: Oropharynx is clear and moist. No oropharyngeal exudate.  Eyes: Conjunctivae are normal. Right eye exhibits no discharge. Left eye exhibits no discharge.  Refused to open his eyes  Neck: No JVD present.  Cardiovascular: Normal rate and regular rhythm.  No murmur heard. Pulmonary/Chest: Effort normal and breath sounds normal. No respiratory distress. He has no wheezes.  Abdominal: Soft. Bowel sounds are normal. He exhibits no distension. There is no tenderness.  Lymphadenopathy:    He has cervical adenopathy.  Neurological: No cranial nerve deficit.  Sleepy, does not follow commands  which is baseline. Not oriented which is baseline.   Skin: Skin is warm and dry. He is not diaphoretic.  Psychiatric:  Resist exam     Labs reviewed: Recent Labs    11/17/16 01/29/17 06/23/17  NA 140 137 142  K 3.6 3.8 4.3  BUN 11 18 16   CREATININE 0.7 0.8 0.9   Recent Labs    06/23/17  AST 12*  ALT 16  ALKPHOS 171*   Recent Labs    01/29/17 01/30/17 06/23/17  WBC 11.2 10.3 7.5  HGB 11.3* 11.0* 12.9*  HCT 34* 32* 39*  PLT 274 287 194   Lab Results  Component Value Date   TSH 0.77 10/31/2016   Lab Results  Component Value Date   HGBA1C 6.8 06/23/2017   Lab Results  Component Value Date   CHOL 124 10/26/2016   HDL 40 10/26/2016   LDLCALC 62 10/26/2016   TRIG 111 10/26/2016    Significant Diagnostic Results in last 30 days:  No results found.  Assessment/Plan  1. Viral URI with cough He is more awake now and does not appear to be in acute distress. His lungs are clear on exam and he  did not cough during my visit. Due to previous flu outbreak will check flu swab although I do not believe he has had sick contacts  Check flu swab and maintain droplet precautions Encourage fluids as able Monitor VS and if temp or decreased 02 sats could consider chest xray but given his poor quality of life and progressive dementia we could opt for comfort care only Duoneb TID prn sob or cough  Diabetes mellitus type 2 without retinopathy ([place]) Diet controlled. Continue to monitor A1C periodically. 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 Progressive decline in cognition and function. He is no longer on namenda due to lack of benefit. He is followed by Dr Casimiro Needle for behavioral disturbances. Lab testing for monitoring related to tegretol were normal.   Hyperuricemia Continue allopurinol 100 mg qd  Constipation Add colace 100 mg BID Give dulcolax supp today     Cindi Carbon, [place] [phone]

## 2017-09-07 NOTE — Assessment & Plan Note (Signed)
Add colace 100 mg BID Give dulcolax supp today

## 2017-09-07 NOTE — Assessment & Plan Note (Signed)
Advanced in natures. Progressive decline in cognition and function. He is no longer on namenda due to lack of benefit. He is followed by Dr Casimiro Needle for behavioral disturbances. Lab testing for monitoring related to tegretol were normal.

## 2017-10-09 ENCOUNTER — Encounter: Payer: Self-pay | Admitting: Internal Medicine

## 2017-10-09 ENCOUNTER — Non-Acute Institutional Stay (SKILLED_NURSING_FACILITY): Payer: PPO | Admitting: Internal Medicine

## 2017-10-09 DIAGNOSIS — F0280 Dementia in other diseases classified elsewhere without behavioral disturbance: Secondary | ICD-10-CM

## 2017-10-09 DIAGNOSIS — R634 Abnormal weight loss: Secondary | ICD-10-CM

## 2017-10-09 DIAGNOSIS — E785 Hyperlipidemia, unspecified: Secondary | ICD-10-CM

## 2017-10-09 DIAGNOSIS — E1142 Type 2 diabetes mellitus with diabetic polyneuropathy: Secondary | ICD-10-CM

## 2017-10-09 DIAGNOSIS — G3109 Other frontotemporal dementia: Secondary | ICD-10-CM | POA: Diagnosis not present

## 2017-10-09 DIAGNOSIS — E1169 Type 2 diabetes mellitus with other specified complication: Secondary | ICD-10-CM

## 2017-10-09 DIAGNOSIS — K5901 Slow transit constipation: Secondary | ICD-10-CM

## 2017-10-09 NOTE — Progress Notes (Signed)
Patient ID: Jose Castillo, male   DOB: Jan 28, 1941, 77 y.o.   MRN: [id number]  Provider:  Jonelle Sidle L. Mariea Clonts, D.O., C.M.D. Location:  [facility] Room Number: 496 Place of Service:  SNF (31)  PCP: [name], DO Patient Care Team: [name], DO as PCP - General (Geriatric Medicine)  Extended Emergency Contact Information Primary Emergency Contact: Alley,Kelly Address: [address]          [place], [place] [address] [name] of [place] Phone: [phone] Relation: Daughter Secondary Emergency Contact: Scull,[name] Address: [address] [place] [address] [place] of [place] Phone: [phone] Relation: Son  Code Status: DNR, needs a MOST form done Goals of Care: Advanced Directive information Advanced Directives 10/09/2017  Does Patient Have a Medical Advance Directive? Yes  Type of Paramedic of [place];Out of facility DNR (pink MOST or yellow form)  Does patient want to make changes to medical advance directive? No - Patient declined  Copy of [place] in Chart? Yes  Pre-existing out of facility DNR order (yellow form or pink MOST form) Yellow form placed in chart (order not valid for inpatient use)    Chief Complaint  Patient presents with  . New Admit To SNF    skilled admission    HPI: Patient is a 77 y.o. male with h/o frontal dementia, CAD, htn, DMII with neuropathy and hyperlipidemia, depression due to dementia, bilateral knee OA, BPH, overactive bladder and constipation seen today for admission to SNF from AL bed in memory care due to progressive skilled needs.  He is fully functionally dependent.  He had quite a bit of difficulty with behaviors from his dementia several months ago.  He was urinating in the plants and trash cans, also walking around the unit naked.  He has been followed by geriatric psychiatry due to these behaviors. For the past few  months, when seen, he's been very drowsy or asleep and difficult to arouse.  Staff report that he is much more alert later in the afternoon and evening and may even speak occasionally at that time.  Tegretol level has been wnl at 9.5 12/18.  Nursing on the skilled unit report that he's been so lethargic that they cannot give him his am meds or noon meds. He''s actually gained weight per records (med effect or scale difference).  He's no longer ambulatory.  Past Medical History:  Diagnosis Date  . Dementia   . Diabetes ([place])   . Heart disease   . Hyperlipidemia   . Hypotension    Past Surgical History:  Procedure Laterality Date  . APPENDECTOMY  1961  . CORONARY ANGIOPLASTY WITH STENT PLACEMENT  2016  . TONSILLECTOMY AND ADENOIDECTOMY    . TOTAL KNEE ARTHROPLASTY  2012    reports that he quit smoking about 16 years ago. His smoking use included cigarettes. He has a 10.00 pack-year smoking history. He has never used smokeless tobacco. He reports that he does not drink alcohol or use drugs. Social History   Socioeconomic History  . Marital status: Widowed    Spouse name: Not on file  . Number of children: 3  . Years of education: MBA  . Highest education level: Not on file  Occupational History  . Occupation: N/A  Social Needs  . Financial resource strain: Not on file  . Food insecurity:    Worry: Not on file  Inability: Not on file  . Transportation needs:    Medical: Not on file    Non-medical: Not on file  Tobacco Use  . Smoking status: Former Smoker    Packs/day: 0.50    Years: 20.00    Pack years: 10.00    Types: Cigarettes    Last attempt to quit: 2003    Years since quitting: 16.2  . Smokeless tobacco: Never Used  Substance and Sexual Activity  . Alcohol use: No    Comment: Quit 2013  . Drug use: No  . Sexual activity: Never  Lifestyle  . Physical activity:    Days per week: Not on file    Minutes per session: Not on file  . Stress: Not on file    Relationships  . Social connections:    Talks on phone: Not on file    Gets together: Not on file    Attends religious service: Not on file    Active member of club or organization: Not on file    Attends meetings of clubs or organizations: Not on file    Relationship status: Not on file  . Intimate partner violence:    Fear of current or ex partner: Not on file    Emotionally abused: Not on file    Physically abused: Not on file    Forced sexual activity: Not on file  Other Topics Concern  . Not on file  Social History Narrative   Right-handed      Do you drink/eat things with caffeine? occasional diet Coke      Marital status?  widowed                        What year were you married?  1966      Do you live in a house, apartment, assisted living, condo, trailer, etc.? [place]      Is it one or more stories? one      How many persons live in your home?      Do you have any pets in your home? (please list) none      Current or past profession: financial      Do you exercise?   yes                                   Type & how often? 1/day      Do you have a living will? yes      Do you have a DNR form?                                  If not, do you want to discuss one?      Do you have signed POA/HPOA for forms?     Functional Status Survey:  dependent in all adls and needs help being fed  Family History  Problem Relation Age of Onset  . Cancer Mother 24       Lymphoma  . Heart attack Father 65  . Diabetes Sister   . Brain cancer Sister     Health Maintenance  Topic Date Due  . OPHTHALMOLOGY EXAM  06/22/2018 (Originally 01/15/1951)  . URINE MICROALBUMIN  10/14/2017  . HEMOGLOBIN A1C  12/22/2017  . INFLUENZA VACCINE  01/31/2018  . FOOT EXAM  03/15/2018  .  TETANUS/TDAP  06/02/2027  . PNA vac Low Risk Adult  Completed  . COLONOSCOPY  Discontinued    Allergies  Allergen Reactions  . Lortab [Hydrocodone-Acetaminophen] Other (See Comments)     Reaction:  Affected pts cognition   . Morphine And Related Other (See Comments)    Reaction:  Affected pts cognition     Outpatient Encounter Medications as of 10/09/2017  Medication Sig  . acetaminophen (TYLENOL) 650 MG CR tablet Take 650 mg by mouth daily.  Marland Kitchen allopurinol (ZYLOPRIM) 100 MG tablet Take 1 tablet (100 mg total) by mouth daily.  . bisacodyl (DULCOLAX) 10 MG suppository Place 10 mg rectally as needed for moderate constipation.  . carbamazepine (TEGRETOL) 100 MG chewable tablet Chew 200 mg by mouth every morning. 400mg  at bedtime  . clopidogrel (PLAVIX) 75 MG tablet Take 1 tablet (75 mg total) by mouth daily.  Marland Kitchen docusate (COLACE) 50 MG/5ML liquid Take 100 mg by mouth daily.  Marland Kitchen gabapentin (NEURONTIN) 100 MG capsule Take 100 mg by mouth daily. Give 200mg  by mouth at bedtime  . GLUCERNA (GLUCERNA) LIQD Take 237 mLs by mouth daily.  . hydrOXYzine (VISTARIL) 25 MG capsule Take 25 mg by mouth at bedtime.  Marland Kitchen ipratropium-albuterol (DUONEB) 0.5-2.5 (3) MG/3ML SOLN Take 3 mLs by nebulization 3 (three) times daily as needed (cough).  . nitroGLYCERIN (NITROSTAT) 0.4 MG SL tablet Place 0.4 mg under the tongue every 5 (five) minutes as needed for chest pain.  . polyethylene glycol (MIRALAX / GLYCOLAX) packet Take 17 g by mouth daily.  . vitamin B-12 (CYANOCOBALAMIN) 1000 MCG tablet Take 1 tablet (1,000 mcg total) by mouth daily.  . [DISCONTINUED] Melatonin 10 MG TABS Take 10 mg by mouth at bedtime.   No facility-administered encounter medications on file as of 10/09/2017.     Review of Systems  Constitutional: Positive for activity change. Negative for appetite change, chills and fever.  HENT: Negative for congestion.   Respiratory: Negative for chest tightness and shortness of breath.   Cardiovascular: Negative for chest pain, palpitations and leg swelling.  Gastrointestinal: Negative for constipation, diarrhea, nausea and vomiting.  Genitourinary: Negative for dysuria.   Musculoskeletal: Positive for gait problem.  Skin: Negative for color change.  Neurological: Negative for dizziness.  Hematological: Does not bruise/bleed easily.  Psychiatric/Behavioral: Positive for behavioral problems and confusion. Negative for sleep disturbance.    Vitals:   10/09/17 1133  BP: (!) 116/97  Pulse: 69  Resp: 20  Temp: (!) 96.8 F (36 C)  TempSrc: Oral  SpO2: 97%  Weight: 155 lb (70.3 kg)  Height: 5\' 5"  (1.651 m)   Body mass index is 25.79 kg/m. Physical Exam  Constitutional: No distress.  HENT:  Head: Normocephalic and atraumatic.  Right Ear: External ear normal.  Left Ear: External ear normal.  Nose: Nose normal.  Eyes:  Keeps eyes closed  Neck: Neck supple.  Cardiovascular: Normal rate, regular rhythm, normal heart sounds and intact distal pulses.  Pulmonary/Chest: Effort normal and breath sounds normal.  Abdominal: Soft. Bowel sounds are normal. He exhibits no distension and no mass. There is no tenderness. There is no rebound and no guarding.  Musculoskeletal:  Rigid extremities  Lymphadenopathy:    He has no cervical adenopathy.  Neurological:  Unresponsive--not opening eyes  Skin: Skin is warm and dry.    Labs reviewed: Basic Metabolic Panel: Recent Labs    11/17/16 01/29/17 06/23/17  NA 140 137 142  K 3.6 3.8 4.3  BUN 11 18 16   CREATININE  0.7 0.8 0.9   Liver Function Tests: Recent Labs    06/23/17  AST 12*  ALT 16  ALKPHOS 171*   No results for input(s): LIPASE, AMYLASE in the last 8760 hours. No results for input(s): AMMONIA in the last 8760 hours. CBC: Recent Labs    01/29/17 01/30/17 06/23/17  WBC 11.2 10.3 7.5  HGB 11.3* 11.0* 12.9*  HCT 34* 32* 39*  PLT 274 287 194   Cardiac Enzymes: No results for input(s): CKTOTAL, CKMB, CKMBINDEX, TROPONINI in the last 8760 hours. BNP: Invalid input(s): POCBNP Lab Results  Component Value Date   HGBA1C 6.8 06/23/2017   Lab Results  Component Value Date   TSH 0.77  10/31/2016   Lab Results  Component Value Date   VITAMINB12 2,000 10/24/2016    Assessment/Plan 1. Frontal dementia -seems to be a bit too sedate to me, but staff report more alert in late afternoon, evening -I've asked that DON make sure Dr. Casimiro Needle sees him this month when he comes to see geri psych patients to see if vistaril or tegretol can be reduced -I stopped his melatonin  2. Diabetic peripheral neuropathy associated with type 2 diabetes mellitus ([place]) -last hba1c at goal in December, should check again in June -cont glucerna supplements  3. Hyperlipidemia associated with type 2 diabetes mellitus ([place]) -off statins due to advanced dementia -has CAD, also on plavix--will need to discuss goals again with family in the near future and need for this, b12, allopurinol for him--recommend a focus more on comfort meds only  4. Slow transit constipation -cont miralax daily, prn dulcolax, colace daily  5. Weight loss -has gained weight with glucerna supplement  Family/ staff Communication: discussed with SNF nurse  Labs/tests ordered:  No new  Giacomo Valone L. Hebe Merriwether, D.O. [facility] [address], [place] [address] Cell Phone (Mon-Fri 8am-5pm):  [phone] On Call:  [phone] & follow prompts after 5pm & weekends Office Phone:  [phone] Office Fax:  [phone]

## 2017-11-15 DIAGNOSIS — F0280 Dementia in other diseases classified elsewhere without behavioral disturbance: Secondary | ICD-10-CM | POA: Diagnosis not present

## 2017-11-15 DIAGNOSIS — F329 Major depressive disorder, single episode, unspecified: Secondary | ICD-10-CM | POA: Diagnosis not present

## 2017-11-16 ENCOUNTER — Non-Acute Institutional Stay (SKILLED_NURSING_FACILITY): Payer: PPO | Admitting: Adult Health

## 2017-11-16 DIAGNOSIS — E1142 Type 2 diabetes mellitus with diabetic polyneuropathy: Secondary | ICD-10-CM | POA: Diagnosis not present

## 2017-11-16 DIAGNOSIS — G3109 Other frontotemporal dementia: Secondary | ICD-10-CM | POA: Diagnosis not present

## 2017-11-16 DIAGNOSIS — R634 Abnormal weight loss: Secondary | ICD-10-CM

## 2017-11-16 DIAGNOSIS — F0280 Dementia in other diseases classified elsewhere without behavioral disturbance: Secondary | ICD-10-CM | POA: Diagnosis not present

## 2017-11-16 DIAGNOSIS — E119 Type 2 diabetes mellitus without complications: Secondary | ICD-10-CM | POA: Diagnosis not present

## 2017-11-16 DIAGNOSIS — E790 Hyperuricemia without signs of inflammatory arthritis and tophaceous disease: Secondary | ICD-10-CM

## 2017-11-20 ENCOUNTER — Encounter: Payer: Self-pay | Admitting: Adult Health

## 2017-11-20 NOTE — Progress Notes (Signed)
Location:  Occupational psychologist of Service:  SNF (31) Provider:   Cindi Carbon, ANP [facility] [phone]   [name], DO  Patient Care Team: [name], DO as PCP - General (Geriatric Medicine)  Extended Emergency Contact Information Primary Emergency Contact: Alley,Kelly Address: [address]          [place], [place] [address] [name] of [place] Phone: [phone] Relation: Daughter Secondary Emergency Contact: Fiebig,[name] Address: [address] [place] [address] [place] Phone: [phone] Relation: Son  Code Status:  DNR Goals of care: Advanced Directive information Advanced Directives 10/09/2017  Does Patient Have a Medical Advance Directive? Yes  Type of Paramedic of [place];Out of facility DNR (pink MOST or yellow form)  Does patient want to make changes to medical advance directive? No - Patient declined  Copy of [place] in Chart? Yes  Pre-existing out of facility DNR order (yellow form or pink MOST form) Yellow form placed in chart (order not valid for inpatient use)     Chief Complaint  Patient presents with  . Medical Management of Chronic Issues    HPI:  Pt is a 77 y.o. male seen today for medical management of chronic diseases. He has a hx of fronto temporal dementia and recently moved to skilled care due to functional and cognitive decline. He is dependent on staff for ADLs and minimally verbal. He can be resistant to care but over they report he is manageable during personal care. Last month when he was seen by Dr. Mariea Clonts there was concern for excess sedation.  The nurse reports he is more alert and fidgets in his chair and pulls off his shoes regularly.  He is on tegretol and vistaril for behavioral issues, last carbamazepine level 9.5 12/22  Gout: no new flares, uric acid level 4.3 12/22  BP and pulse on the low side  in review of matrix records at times  Pulse 45-70 range BP range 89-106/55-62  Diet: D3 with thin liquid, tolerating well Wt Readings from Last 3 Encounters:  11/20/17 153 lb 6.4 oz (69.6 kg)  10/09/17 155 lb (70.3 kg)  09/07/17 150 lb 4.8 oz (68.2 kg)   Functional status: incontinent, minimally ambulatory     Past Medical History:  Diagnosis Date  . Dementia   . Diabetes ([place])   . Heart disease   . Hyperlipidemia   . Hypotension    Past Surgical History:  Procedure Laterality Date  . APPENDECTOMY  1961  . CORONARY ANGIOPLASTY WITH STENT PLACEMENT  2016  . TONSILLECTOMY AND ADENOIDECTOMY    . TOTAL KNEE ARTHROPLASTY  2012    Allergies  Allergen Reactions  . Lortab [Hydrocodone-Acetaminophen] Other (See Comments)    Reaction:  Affected pts cognition   . Morphine And Related Other (See Comments)    Reaction:  Affected pts cognition     Outpatient Encounter Medications as of 11/16/2017  Medication Sig  . acetaminophen (TYLENOL) 650 MG CR tablet Take 650 mg by mouth daily.  Marland Kitchen allopurinol (ZYLOPRIM) 100 MG tablet Take 1 tablet (100 mg total) by mouth daily.  . bisacodyl (DULCOLAX) 10 MG suppository Place 10 mg rectally as needed for moderate constipation.  . carbamazepine (TEGRETOL) 100 MG chewable tablet Chew 200 mg by mouth every morning. 400mg  at bedtime  . clopidogrel (PLAVIX) 75 MG tablet Take 1 tablet (75  mg total) by mouth daily.  Marland Kitchen gabapentin (NEURONTIN) 100 MG capsule Take 100 mg by mouth daily. Give 200mg  by mouth at bedtime  . GLUCERNA (GLUCERNA) LIQD Take 237 mLs by mouth daily.  . hydrOXYzine (VISTARIL) 25 MG capsule Take 25 mg by mouth at bedtime.  Marland Kitchen ipratropium-albuterol (DUONEB) 0.5-2.5 (3) MG/3ML SOLN Take 3 mLs by nebulization 3 (three) times daily as needed (cough).  . polyethylene glycol (MIRALAX / GLYCOLAX) packet Take 17 g by mouth daily.  Marland Kitchen senna-docusate (SENOKOT-S) 8.6-50 MG tablet Take 1 tablet by mouth 2 (two) times daily.  . vitamin B-12  (CYANOCOBALAMIN) 1000 MCG tablet Take 1 tablet (1,000 mcg total) by mouth daily.  . nitroGLYCERIN (NITROSTAT) 0.4 MG SL tablet Place 0.4 mg under the tongue every 5 (five) minutes as needed for chest pain.  . [DISCONTINUED] docusate (COLACE) 50 MG/5ML liquid Take 100 mg by mouth daily.   No facility-administered encounter medications on file as of 11/16/2017.     Review of Systems  Immunization History  Administered Date(s) Administered  . Influenza-Unspecified 05/05/2010, 04/11/2013, 03/03/2015, 09/06/2016, 05/10/2017  . Pneumococcal Polysaccharide-23 12/25/2010  . Pneumococcal-Unspecified 02/28/2013  . Td 06/01/2017  . Tdap 03/22/2006  . Zoster 11/01/2012  . Zoster Recombinat (Shingrix) 05/25/2017, 09/25/2017   Pertinent  Health Maintenance Due  Topic Date Due  . URINE MICROALBUMIN  12/17/2017 (Originally 10/14/2017)  . OPHTHALMOLOGY EXAM  06/22/2018 (Originally 01/15/1951)  . HEMOGLOBIN A1C  12/22/2017  . INFLUENZA VACCINE  01/31/2018  . FOOT EXAM  03/15/2018  . PNA vac Low Risk Adult  Completed  . COLONOSCOPY  Discontinued   Fall Risk  05/29/2017 04/05/2016  Falls in the past year? No Yes  Number falls in past yr: - 1  Injury with Fall? - Yes   Functional Status Survey:    Vitals:   11/20/17 0738  Weight: 153 lb 6.4 oz (69.6 kg)   Body mass index is 25.53 kg/m. Physical Exam  Labs reviewed: Recent Labs    01/29/17 06/23/17  NA 137 142  K 3.8 4.3  BUN 18 16  CREATININE 0.8 0.9   Recent Labs    06/23/17  AST 12*  ALT 16  ALKPHOS 171*   Recent Labs    01/29/17 01/30/17 06/23/17  WBC 11.2 10.3 7.5  HGB 11.3* 11.0* 12.9*  HCT 34* 32* 39*  PLT 274 287 194   Lab Results  Component Value Date   TSH 0.77 10/31/2016   Lab Results  Component Value Date   HGBA1C 6.8 06/23/2017   Lab Results  Component Value Date   CHOL 124 10/26/2016   HDL 40 10/26/2016   LDLCALC 62 10/26/2016   TRIG 111 10/26/2016    Significant Diagnostic Results in last 30  days:  No results found.  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 Has periods of restless and therefore we will continue the tegretol and Vistaril   Hyperuricemia Continue allopurinol at 100 mg qd  Diabetes mellitus type 2 without retinopathy ([place]) Diet controlled. Continue to monitor A1C q 6 months. No longer goes to the ophthalmologist due to behavioral issues.   Weight loss Weighs have stabilized.   Diabetic peripheral neuropathy associated with type 2 diabetes mellitus ([place]) Controlled, continue Neurontin.    Family/ staff Communication: staff  Labs/tests ordered:  NA

## 2017-11-20 NOTE — Assessment & Plan Note (Signed)
Weighs have stabilized.

## 2017-11-20 NOTE — Assessment & Plan Note (Signed)
Progressive decline in cognition and function necessitating a move to skilled care. Has periods of restless and therefore we will continue the tegretol and Vistaril

## 2017-11-20 NOTE — Assessment & Plan Note (Signed)
Diet controlled. Continue to monitor A1C q 6 months. No longer goes to the ophthalmologist due to behavioral issues.

## 2017-11-20 NOTE — Assessment & Plan Note (Signed)
Continue allopurinol at 100 mg qd

## 2017-11-20 NOTE — Assessment & Plan Note (Signed)
Controlled, continue Neurontin 

## 2017-12-11 ENCOUNTER — Encounter: Payer: Self-pay | Admitting: Internal Medicine

## 2017-12-11 ENCOUNTER — Non-Acute Institutional Stay (SKILLED_NURSING_FACILITY): Payer: PPO | Admitting: Internal Medicine

## 2017-12-11 DIAGNOSIS — G3109 Other frontotemporal dementia: Secondary | ICD-10-CM

## 2017-12-11 DIAGNOSIS — E1149 Type 2 diabetes mellitus with other diabetic neurological complication: Secondary | ICD-10-CM

## 2017-12-11 DIAGNOSIS — F0393 Unspecified dementia, unspecified severity, with mood disturbance: Secondary | ICD-10-CM

## 2017-12-11 DIAGNOSIS — F329 Major depressive disorder, single episode, unspecified: Secondary | ICD-10-CM

## 2017-12-11 DIAGNOSIS — K5901 Slow transit constipation: Secondary | ICD-10-CM | POA: Diagnosis not present

## 2017-12-11 DIAGNOSIS — E1142 Type 2 diabetes mellitus with diabetic polyneuropathy: Secondary | ICD-10-CM | POA: Diagnosis not present

## 2017-12-11 DIAGNOSIS — F0280 Dementia in other diseases classified elsewhere without behavioral disturbance: Secondary | ICD-10-CM | POA: Diagnosis not present

## 2017-12-11 DIAGNOSIS — I25118 Atherosclerotic heart disease of native coronary artery with other forms of angina pectoris: Secondary | ICD-10-CM

## 2017-12-11 DIAGNOSIS — F32A Depression, unspecified: Secondary | ICD-10-CM

## 2017-12-11 NOTE — Progress Notes (Signed)
Patient ID: Jose Castillo, male   DOB: 1940/07/25, 77 y.o.   MRN: [id number]  Location:  [facility] Room Number: 737 Place of Service:  SNF ([phone]) Provider:  [name], DO  Patient Care Team: [name], DO as PCP - General (Geriatric Medicine)  Extended Emergency Contact Information Primary Emergency Contact: Alley,Kelly Address: [address]          [place], [place] [address] [name] of [place] Phone: [phone] Relation: Daughter Secondary Emergency Contact: Deguia,[name] Address: [address] [place] [address] [place] of [place] Phone: [phone] Relation: Son  Code Status:  DN R Goals of care: Advanced Directive information Advanced Directives 12/11/2017  Does Patient Have a Medical Advance Directive? Yes  Type of Paramedic of [place];Living will;Out of facility DNR (pink MOST or yellow form)  Does patient want to make changes to medical advance directive? No - Patient declined  Copy of [place] in Chart? Yes  Pre-existing out of facility DNR order (yellow form or pink MOST form) Yellow form placed in chart (order not valid for inpatient use)     Chief Complaint  Patient presents with  . Medical Management of Chronic Issues    Routine Visit    HPI:  Pt is a 77 y.o. male seen today for medical management of chronic diseases.  [name] has advanced dementia and moved from memory care to SNF due to skilled nursing needs and no longer being ambulatory and benefiting from memory care activities.  Staff report he is more awake and talks and sometimes is agitated in late afternoon and evenings.  Earlier in the days, he is sleepy and it's challenging to administer his meds and feed him.   He is on tegretol, vistaril for his behaviors.  He historically was up ambulating, taking his clothes off and urinating in inappropriate places when in memory care.   There are plans for psychiatry to review his meds to see if he still requires these at these doses due to his sleepiness earlier in the day.   Given his debility, he is no longer a candidate for routine lab testing or urine microalbumin for his diabetes.  Goals for him are comfort and not life prolonging measures.    His diabetes has been well controlled nonetheless with diet only as he'd been losing weight as his dementia has progressed.  He's actually on glucerna supplemental shakes.  He has CAD and remains on plavix and prn ntg.    Past Medical History:  Diagnosis Date  . Dementia   . Diabetes ([place])   . Heart disease   . Hyperlipidemia   . Hypotension    Past Surgical History:  Procedure Laterality Date  . APPENDECTOMY  1961  . CORONARY ANGIOPLASTY WITH STENT PLACEMENT  2016  . TONSILLECTOMY AND ADENOIDECTOMY    . TOTAL KNEE ARTHROPLASTY  2012    Allergies  Allergen Reactions  . Lortab [Hydrocodone-Acetaminophen] Other (See Comments)    Reaction:  Affected pts cognition   . Morphine And Related Other (See Comments)    Reaction:  Affected pts cognition     Outpatient Encounter Medications as of 12/11/2017  Medication Sig  . acetaminophen (TYLENOL) 650 MG CR tablet Take 650 mg by mouth daily.  Marland Kitchen allopurinol (ZYLOPRIM) 100 MG tablet Take 1 tablet (100 mg total) by mouth daily.  . bisacodyl (DULCOLAX) 10 MG  suppository Place 10 mg rectally as needed for moderate constipation.  . carbamazepine (TEGRETOL) 100 MG chewable tablet Chew 200 mg by mouth every morning. 400mg  at bedtime  . clopidogrel (PLAVIX) 75 MG tablet Take 1 tablet (75 mg total) by mouth daily.  Marland Kitchen gabapentin (NEURONTIN) 100 MG capsule Take 100 mg by mouth daily. Give 200mg  by mouth at bedtime  . GLUCERNA (GLUCERNA) LIQD Take 237 mLs by mouth daily.  . hydrOXYzine (VISTARIL) 25 MG capsule Take 25 mg by mouth at bedtime.  Marland Kitchen ipratropium-albuterol (DUONEB) 0.5-2.5 (3) MG/3ML SOLN Take 3 mLs by nebulization 3 (three)  times daily as needed (cough).  . nitroGLYCERIN (NITROSTAT) 0.4 MG SL tablet Place 0.4 mg under the tongue every 5 (five) minutes as needed for chest pain.  . polyethylene glycol (MIRALAX / GLYCOLAX) packet Take 17 g by mouth daily.  Marland Kitchen senna-docusate (SENOKOT-S) 8.6-50 MG tablet Take 1 tablet by mouth 2 (two) times daily.  . vitamin B-12 (CYANOCOBALAMIN) 1000 MCG tablet Take 1 tablet (1,000 mcg total) by mouth daily.   No facility-administered encounter medications on file as of 12/11/2017.     Review of Systems  Unable to perform ROS: Dementia (obtained from nursing)  Constitutional: Negative for activity change, appetite change, chills and fever.  HENT: Negative for congestion.   Eyes: Negative for visual disturbance.  Respiratory: Negative for chest tightness and shortness of breath.   Cardiovascular: Negative for chest pain and leg swelling.  Gastrointestinal: Positive for constipation. Negative for diarrhea and nausea.       Managed with current meds  Genitourinary: Negative for dysuria.  Musculoskeletal: Positive for gait problem.  Skin: Negative for color change.  Neurological:       Neuropathy  Hematological: Does not bruise/bleed easily.  Psychiatric/Behavioral: Positive for agitation, confusion and sleep disturbance.    Immunization History  Administered Date(s) Administered  . Influenza-Unspecified 05/05/2010, 04/11/2013, 03/03/2015, 09/06/2016, 05/10/2017  . Pneumococcal Polysaccharide-23 12/25/2010  . Pneumococcal-Unspecified 02/28/2013  . Td 06/01/2017  . Tdap 03/22/2006  . Zoster 11/01/2012  . Zoster Recombinat (Shingrix) 05/25/2017, 09/25/2017   Pertinent  Health Maintenance Due  Topic Date Due  . URINE MICROALBUMIN  12/17/2017 (Originally 10/14/2017)  . OPHTHALMOLOGY EXAM  06/22/2018 (Originally 01/15/1951)  . HEMOGLOBIN A1C  12/22/2017  . INFLUENZA VACCINE  01/31/2018  . FOOT EXAM  03/15/2018  . PNA vac Low Risk Adult  Completed  . COLONOSCOPY  Discontinued    Fall Risk  05/29/2017 04/05/2016  Falls in the past year? No Yes  Number falls in past yr: - 1  Injury with Fall? - Yes   Functional Status Survey:    Vitals:   12/11/17 1156  BP: 104/64  Pulse: (!) 45  Resp: 19  Temp: (!) 96.8 F (36 C)  TempSrc: Oral  SpO2: 97%  Weight: 156 lb (70.8 kg)  Height: 5\' 5"  (1.651 m)   Body mass index is 25.96 kg/m. Physical Exam  Constitutional: No distress.  Drowsy, but seen grabbing at items and talking some   Cardiovascular: Normal rate, regular rhythm, normal heart sounds and intact distal pulses.  Pulmonary/Chest: Effort normal and breath sounds normal. No respiratory distress.  Abdominal: Soft. Bowel sounds are normal.  Musculoskeletal: Normal range of motion.  Sits in manual high-backed wheelchair with leg supports  Neurological:  Picking at items, talking  Skin: Skin is warm and dry.    Labs reviewed: Recent Labs    01/29/17 06/23/17  NA 137 142  K 3.8 4.3  BUN 18  16  CREATININE 0.8 0.9   Recent Labs    06/23/17  AST 12*  ALT 16  ALKPHOS 171*   Recent Labs    01/29/17 01/30/17 06/23/17  WBC 11.2 10.3 7.5  HGB 11.3* 11.0* 12.9*  HCT 34* 32* 39*  PLT 274 287 194   Lab Results  Component Value Date   TSH 0.77 10/31/2016   Lab Results  Component Value Date   HGBA1C 6.8 06/23/2017   Lab Results  Component Value Date   CHOL 124 10/26/2016   HDL 40 10/26/2016   LDLCALC 62 10/26/2016   TRIG 111 10/26/2016    Assessment/Plan 1. 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 Diabetic peripheral neuropathy associated with type 2 diabetes mellitus ([place]) -continues on gabapentin, had this prior to his dementia progression and stopping it  seemed to lead to more agitation due to pain--had low back pain at one time, also, so may have had a component of spinal stenosis with radiculopathy?  4. Slow transit constipation -cont current bowel regimen  5. Type 2 diabetes mellitus with neurological complications ([facility]) -well controlled w/o medications and not getting regular labs due to his goals of care being comfort -on b12 also for deficiency which could have contributed to his neuropathy  6. Coronary artery disease of native artery of native heart with stable angina pectoris ([place]) -remains on plavix with prior stents -would consider stopping this if his daughter is in agreement given his goals of care   Family/ staff Communication: discussed with SNF nurse and nurse manager  Labs/tests ordered:   No new due to goals of care  Carsin Randazzo L. Eugena Rhue, D.O. [facility] [address], [place] [address] Cell Phone (Mon-Fri 8am-5pm):  [phone] On Call:  [phone] & follow prompts after 5pm & weekends Office Phone:  [phone] Office Fax:  [phone]

## 2018-01-07 ENCOUNTER — Non-Acute Institutional Stay (SKILLED_NURSING_FACILITY): Payer: PPO | Admitting: Adult Health

## 2018-01-07 ENCOUNTER — Encounter: Payer: Self-pay | Admitting: Adult Health

## 2018-01-07 DIAGNOSIS — E1142 Type 2 diabetes mellitus with diabetic polyneuropathy: Secondary | ICD-10-CM | POA: Diagnosis not present

## 2018-01-07 DIAGNOSIS — F329 Major depressive disorder, single episode, unspecified: Secondary | ICD-10-CM

## 2018-01-07 DIAGNOSIS — I25118 Atherosclerotic heart disease of native coronary artery with other forms of angina pectoris: Secondary | ICD-10-CM | POA: Diagnosis not present

## 2018-01-07 DIAGNOSIS — E119 Type 2 diabetes mellitus without complications: Secondary | ICD-10-CM

## 2018-01-07 DIAGNOSIS — F0280 Dementia in other diseases classified elsewhere without behavioral disturbance: Secondary | ICD-10-CM | POA: Diagnosis not present

## 2018-01-07 DIAGNOSIS — E790 Hyperuricemia without signs of inflammatory arthritis and tophaceous disease: Secondary | ICD-10-CM

## 2018-01-07 DIAGNOSIS — G3109 Other frontotemporal dementia: Secondary | ICD-10-CM

## 2018-01-07 DIAGNOSIS — F0393 Unspecified dementia, unspecified severity, with mood disturbance: Secondary | ICD-10-CM

## 2018-01-07 NOTE — Assessment & Plan Note (Signed)
Controlled Monitor A1C periodically. Foot exam complete and WNL

## 2018-01-07 NOTE — Assessment & Plan Note (Signed)
Continue plavix °

## 2018-01-07 NOTE — Assessment & Plan Note (Signed)
Continue allopurinol 

## 2018-01-07 NOTE — Assessment & Plan Note (Signed)
Progressive decline in cognition and function. Progressively more sleepy per staff but can be combative at times.  Check tegretol level, F/U by Dr Casimiro Needle

## 2018-01-07 NOTE — Progress Notes (Signed)
This encounter was created in error - please disregard.  This encounter was created in error - please disregard.

## 2018-01-07 NOTE — Assessment & Plan Note (Signed)
Difficult to evaluate in this setting.

## 2018-01-07 NOTE — Assessment & Plan Note (Signed)
Controlled with neurontin

## 2018-01-07 NOTE — Progress Notes (Signed)
Location:  Occupational psychologist of Service:  SNF (31) Provider:   Cindi Carbon, ANP [facility] [phone]   [name], DO  Patient Care Team: [name], DO as PCP - General (Geriatric Medicine)  Extended Emergency Contact Information Primary Emergency Contact: Alley,Kelly Address: [address]          [place], [place] [address] [name] of [place] Phone: [phone] Relation: Daughter Secondary Emergency Contact: Marcum,[name] Address: [address] [place] [address] [place] Phone: [phone] Relation: Son  Code Status:  DNR Goals of care: Advanced Directive information Advanced Directives 12/11/2017  Does Patient Have a Medical Advance Directive? Yes  Type of Paramedic of [place];Living will;Out of facility DNR (pink MOST or yellow form)  Does patient want to make changes to medical advance directive? No - Patient declined  Copy of [place] in Chart? Yes  Pre-existing out of facility DNR order (yellow form or pink MOST form) Yellow form placed in chart (order not valid for inpatient use)     Chief Complaint  Patient presents with  . Medical Management of Chronic Issues    HPI:  Pt is a 77 y.o. male seen today for medical management of chronic diseases.    Frontal dementia: The staff report that he is more sleepy over time and at times keeps his eyes closed while being fed. He continues to resist personal care and can be combative during care. Followed by Dr. Casimiro Needle, he is currently on Tegretol  DM II: off medications CBGS in the morning run 119-142 Lab Results  Component Value Date   HGBA1C 6.8 06/23/2017  According to the weights in the records in has gained 6 lbs over the past few months  Hx of peripheral neuropathy on neurontin, no reports of pain  Depression: due to his advanced dementia this is difficult to assess  Hx  of CAD on plavix (hx of stent placement) Not on statin due to goals of care/lack of benenfit  Gout: no new events   Functional status: incontinent, hoyer lift  Past Medical History:  Diagnosis Date  . Dementia   . Diabetes ([place])   . Heart disease   . Hyperlipidemia   . Hypotension    Past Surgical History:  Procedure Laterality Date  . APPENDECTOMY  1961  . CORONARY ANGIOPLASTY WITH STENT PLACEMENT  2016  . TONSILLECTOMY AND ADENOIDECTOMY    . TOTAL KNEE ARTHROPLASTY  2012    Allergies  Allergen Reactions  . Lortab [Hydrocodone-Acetaminophen] Other (See Comments)    Reaction:  Affected pts cognition   . Morphine And Related Other (See Comments)    Reaction:  Affected pts cognition     Outpatient Encounter Medications as of 01/07/2018  Medication Sig  . acetaminophen (TYLENOL) 650 MG CR tablet Take 650 mg by mouth daily.  Marland Kitchen allopurinol (ZYLOPRIM) 100 MG tablet Take 1 tablet (100 mg total) by mouth daily.  . bisacodyl (DULCOLAX) 10 MG suppository Place 10 mg rectally as needed for moderate constipation.  . carbamazepine (TEGRETOL) 100 MG chewable tablet Chew 200 mg by mouth every morning. 400mg  at bedtime  . clopidogrel (PLAVIX) 75 MG tablet Take 1 tablet (75 mg total) by mouth daily.  Marland Kitchen gabapentin (NEURONTIN) 100 MG capsule Take 100 mg by mouth daily. Give 200mg  by mouth at bedtime  . GLUCERNA ([place]) LIQD Take 237  mLs by mouth daily.  . hydrOXYzine (VISTARIL) 25 MG capsule Take 25 mg by mouth at bedtime.  Marland Kitchen ipratropium-albuterol (DUONEB) 0.5-2.5 (3) MG/3ML SOLN Take 3 mLs by nebulization 3 (three) times daily as needed (cough).  . nitroGLYCERIN (NITROSTAT) 0.4 MG SL tablet Place 0.4 mg under the tongue every 5 (five) minutes as needed for chest pain.  . polyethylene glycol (MIRALAX / GLYCOLAX) packet Take 17 g by mouth daily.  Marland Kitchen senna-docusate (SENOKOT-S) 8.6-50 MG tablet Take 1 tablet by mouth 2 (two) times daily.  . vitamin B-12 (CYANOCOBALAMIN) 1000 MCG tablet Take 1  tablet (1,000 mcg total) by mouth daily.   No facility-administered encounter medications on file as of 01/07/2018.     Review of Systems  Immunization History  Administered Date(s) Administered  . Influenza-Unspecified 05/05/2010, 04/11/2013, 03/03/2015, 09/06/2016, 05/10/2017  . Pneumococcal Polysaccharide-23 12/25/2010  . Pneumococcal-Unspecified 02/28/2013  . Td 06/01/2017  . Tdap 03/22/2006  . Zoster 11/01/2012  . Zoster Recombinat (Shingrix) 05/25/2017, 09/25/2017   Pertinent  Health Maintenance Due  Topic Date Due  . HEMOGLOBIN A1C  12/22/2017  . OPHTHALMOLOGY EXAM  06/22/2018 (Originally 01/15/1951)  . INFLUENZA VACCINE  01/31/2018  . FOOT EXAM  03/15/2018  . PNA vac Low Risk Adult  Completed  . COLONOSCOPY  Discontinued   Fall Risk  05/29/2017 04/05/2016  Falls in the past year? No Yes  Number falls in past yr: - 1  Injury with Fall? - Yes   Functional Status Survey:    Vitals:   01/07/18 1229  Weight: 159 lb 12.8 oz (72.5 kg)   Body mass index is 26.59 kg/m.  Wt Readings from Last 3 Encounters:  01/07/18 159 lb 12.8 oz (72.5 kg)  12/11/17 156 lb (70.8 kg)  11/20/17 153 lb 6.4 oz (69.6 kg)   Physical Exam  Constitutional: No distress.  HENT:  Head: Normocephalic and atraumatic.  Neck: No JVD present. No tracheal deviation present. No thyromegaly present.  Cardiovascular: Normal rate and regular rhythm.  No murmur heard. Pulmonary/Chest: Effort normal and breath sounds normal. No respiratory distress. He has no wheezes.  Abdominal: Soft. Bowel sounds are normal. He exhibits no distension. There is no tenderness.  Musculoskeletal: He exhibits no edema or tenderness.  Rigidity to BUE and BLE. Resist ROM  Lymphadenopathy:    He has no cervical adenopathy.  Neurological:  Eyes remain closed, arouses to verbal stimulus but is not verbal and does not follow commands  Skin: Skin is warm and dry. He is not diaphoretic.  Nursing note and vitals  reviewed.   Labs reviewed: Recent Labs    01/29/17 06/23/17  NA 137 142  K 3.8 4.3  BUN 18 16  CREATININE 0.8 0.9   Recent Labs    06/23/17  AST 12*  ALT 16  ALKPHOS 171*   Recent Labs    01/29/17 01/30/17 06/23/17  WBC 11.2 10.3 7.5  HGB 11.3* 11.0* 12.9*  HCT 34* 32* 39*  PLT 274 287 194   Lab Results  Component Value Date   TSH 0.77 10/31/2016   Lab Results  Component Value Date   HGBA1C 6.8 06/23/2017   Lab Results  Component Value Date   CHOL 124 10/26/2016   HDL 40 10/26/2016   LDLCALC 62 10/26/2016   TRIG 111 10/26/2016    Significant Diagnostic Results in last 30 days:  No results found.  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 Progressively more sleepy per staff but can be combative  at times.  Check tegretol level, F/U by Dr Casimiro Needle  Hyperuricemia Continue allopurinol.   Coronary artery disease of native artery of native heart with stable angina pectoris (HCC) Continue plavix.   Diabetes mellitus type 2 without retinopathy (HCC) Controlled Monitor A1C periodically. Foot exam complete and WNL  Diabetic peripheral neuropathy associated with type 2 diabetes mellitus ([place]) Controlled with neurontin  Depression due to dementia Difficult to evaluate in this setting.    Family/ staff Communication: staff/residnet  Labs/tests ordered: Check tegretol level

## 2018-01-08 DIAGNOSIS — Z79899 Other long term (current) drug therapy: Secondary | ICD-10-CM | POA: Diagnosis not present

## 2018-02-07 ENCOUNTER — Non-Acute Institutional Stay (SKILLED_NURSING_FACILITY): Payer: PPO | Admitting: Adult Health

## 2018-02-07 DIAGNOSIS — G6289 Other specified polyneuropathies: Secondary | ICD-10-CM

## 2018-02-07 DIAGNOSIS — G3109 Other frontotemporal dementia: Secondary | ICD-10-CM

## 2018-02-07 DIAGNOSIS — E119 Type 2 diabetes mellitus without complications: Secondary | ICD-10-CM

## 2018-02-07 DIAGNOSIS — N400 Enlarged prostate without lower urinary tract symptoms: Secondary | ICD-10-CM

## 2018-02-07 DIAGNOSIS — F0280 Dementia in other diseases classified elsewhere without behavioral disturbance: Secondary | ICD-10-CM

## 2018-02-07 DIAGNOSIS — M170 Bilateral primary osteoarthritis of knee: Secondary | ICD-10-CM

## 2018-02-09 ENCOUNTER — Encounter: Payer: Self-pay | Admitting: Adult Health

## 2018-02-09 NOTE — Progress Notes (Signed)
Location:  Occupational psychologist of Service:  SNF (31) Provider:  Cindi Carbon, ANP [place] [phone]   [name], DO  Patient Care Team: [name], DO as PCP - General (Geriatric Medicine)  Extended Emergency Contact Information Primary Emergency Contact: Alley,Kelly Address: [address]          [place], [place] [address] [name] of [place] Phone: [phone] Relation: Daughter Secondary Emergency Contact: Buras,[name] Address: [address] [place] [address] [place] of [place] Phone: [phone] Relation: Son  Code Status:  DNR Goals of care: Advanced Directive information Advanced Directives 12/11/2017  Does Patient Have a Medical Advance Directive? Yes  Type of Paramedic of [place];Living will;Out of facility DNR (pink MOST or yellow form)  Does patient want to make changes to medical advance directive? No - Patient declined  Copy of [place] in Chart? Yes  Pre-existing out of facility DNR order (yellow form or pink MOST form) Yellow form placed in chart (order not valid for inpatient use)     Chief Complaint  Patient presents with  . Medical Management of Chronic Issues    HPI:  Pt is a 77 y.o. male seen today for medical management of chronic diseases.   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 In review of nsg notes in matrix he continues to have periods of agitation with hitting during personal care. Very minimally verbal and dependent on staff for all ADLs. Carbamazepine level 8 on 7/9  Bilateral OA of the knees: no perceived pain reported  BPH: incontinent, no reports of difficulty urinating  DM II: off medication, does not go to the ophthalmologist due to behaviors Lab Results  Component Value Date   HGBA1C 6.8 06/23/2017  CBGs 120-130's  NO vitals in matrix since 7/23, will report to  staff for my review  Peripheral neuropathy: remains on neurotin, no pain reported    Functional status: hoyer lift, incontinent  Past Medical History:  Diagnosis Date  . Dementia   . Diabetes ([place])   . Heart disease   . Hyperlipidemia   . Hypotension    Past Surgical History:  Procedure Laterality Date  . APPENDECTOMY  1961  . CORONARY ANGIOPLASTY WITH STENT PLACEMENT  2016  . TONSILLECTOMY AND ADENOIDECTOMY    . TOTAL KNEE ARTHROPLASTY  2012    Allergies  Allergen Reactions  . Lortab [Hydrocodone-Acetaminophen] Other (See Comments)    Reaction:  Affected pts cognition   . Morphine And Related Other (See Comments)    Reaction:  Affected pts cognition     Outpatient Encounter Medications as of 02/07/2018  Medication Sig  . acetaminophen (TYLENOL) 650 MG CR tablet Take 650 mg by mouth daily.  Marland Kitchen allopurinol (ZYLOPRIM) 100 MG tablet Take 1 tablet (100 mg total) by mouth daily.  . bisacodyl (DULCOLAX) 10 MG suppository Place 10 mg rectally as needed for moderate constipation.  . carbamazepine (TEGRETOL) 100 MG chewable tablet Chew 200 mg by mouth every morning. 400mg  at bedtime  . clopidogrel (PLAVIX) 75 MG tablet Take 1 tablet (75 mg total) by mouth daily.  Marland Kitchen gabapentin (NEURONTIN) 100 MG capsule Take 100 mg by mouth daily. Give 200mg  by mouth at bedtime  . GLUCERNA (GLUCERNA) LIQD Take 237 mLs by mouth daily.  . hydrOXYzine (VISTARIL) 25 MG capsule Take 25 mg by mouth  at bedtime.  Marland Kitchen ipratropium-albuterol (DUONEB) 0.5-2.5 (3) MG/3ML SOLN Take 3 mLs by nebulization 3 (three) times daily as needed (cough).  . nitroGLYCERIN (NITROSTAT) 0.4 MG SL tablet Place 0.4 mg under the tongue every 5 (five) minutes as needed for chest pain.  . polyethylene glycol (MIRALAX / GLYCOLAX) packet Take 17 g by mouth daily.  Marland Kitchen senna-docusate (SENOKOT-S) 8.6-50 MG tablet Take 1 tablet by mouth 2 (two) times daily.  . vitamin B-12 (CYANOCOBALAMIN) 1000 MCG tablet Take 1 tablet (1,000 mcg total) by  mouth daily.   No facility-administered encounter medications on file as of 02/07/2018.     Review of Systems  Immunization History  Administered Date(s) Administered  . Influenza-Unspecified 05/05/2010, 04/11/2013, 03/03/2015, 09/06/2016, 05/10/2017  . Pneumococcal Polysaccharide-23 12/25/2010  . Pneumococcal-Unspecified 02/28/2013  . Td 06/01/2017  . Tdap 03/22/2006  . Zoster 11/01/2012  . Zoster Recombinat (Shingrix) 05/25/2017, 09/25/2017   Pertinent  Health Maintenance Due  Topic Date Due  . HEMOGLOBIN A1C  12/22/2017  . INFLUENZA VACCINE  01/31/2018  . OPHTHALMOLOGY EXAM  06/22/2018 (Originally 01/15/1951)  . FOOT EXAM  03/15/2018  . PNA vac Low Risk Adult  Completed  . COLONOSCOPY  Discontinued   Fall Risk  05/29/2017 04/05/2016  Falls in the past year? No Yes  Number falls in past yr: - 1  Injury with Fall? - Yes   Functional Status Survey:    Vitals:   02/09/18 0800  Weight: 158 lb 3.2 oz (71.8 kg)   Body mass index is 26.33 kg/m. Physical Exam  Labs reviewed: Recent Labs    06/23/17  NA 142  K 4.3  BUN 16  CREATININE 0.9   Recent Labs    06/23/17  AST 12*  ALT 16  ALKPHOS 171*   Recent Labs    06/23/17  WBC 7.5  HGB 12.9*  HCT 39*  PLT 194   Lab Results  Component Value Date   TSH 0.77 10/31/2016   Lab Results  Component Value Date   HGBA1C 6.8 06/23/2017   Lab Results  Component Value Date   CHOL 124 10/26/2016   HDL 40 10/26/2016   LDLCALC 62 10/26/2016   TRIG 111 10/26/2016    Significant Diagnostic Results in last 30 days:  No results found.  Assessment/Plan  Diabetes mellitus type 2 without retinopathy ([place]) Continue to monitor A1C q 6 months. Goal A1C <8%.   Frontal dementia Progressive decline with less wakeful periods and verbalization. He has a DNR. I offered his daughter Claiborne Billings a most form 1 week ago. She is going to review the form with her brothers and then return to me. I went over it with her and we agreed  that it would be best to avoid hospitalizations. Also, he is not a candidate for a feeding tube and she agrees with this as well. Due to his combative behavior he would likely not tolerate an IV and therefore frequent labs are not recommended. His goals of care are comfort based and given his poor quality of life I explained that antibiotics to prolong his life would not be helpful as people with dementia typically pass away from an infection. Claiborne Billings verbalized understanding and will return the most form after she confirms these directives with her brothers.   BPH without obstruction/lower urinary tract symptoms Not currently on medication. Continue to monitor.  Bilateral primary osteoarthritis of knee Continue tylenol 650 mg qd.   Peripheral neuropathy Controlled, continue current dose of neurontin.   Family/ staff  Communication: staff and daughter kelly  Labs/tests ordered:  NA

## 2018-02-09 NOTE — Assessment & Plan Note (Signed)
Continue to monitor A1C q 6 months. Goal A1C <8%.

## 2018-02-09 NOTE — Assessment & Plan Note (Signed)
Not currently on medication. Continue to monitor.  

## 2018-02-09 NOTE — Assessment & Plan Note (Signed)
Controlled, continue current dose of neurontin.

## 2018-02-09 NOTE — Assessment & Plan Note (Signed)
Continue tylenol 650 mg qd.

## 2018-02-09 NOTE — ACP (Advance Care Planning) (Signed)
Progressive decline with less wakeful periods and verbalization. He has a DNR. I offered his daughter Jose Castillo a most form 1 week ago. She is going to review the form with her brothers and then return to me. I went over it with her and we agreed that it would be best to avoid hospitalizations. Also, he is not a candidate for a feeding tube and she agrees with this as well. Due to his combative behavior he would likely not tolerate an IV and therefore frequent labs are not recommended. His goals of care are comfort based and given his poor quality of life I explained that antibiotics to prolong his life would not be helpful as people with dementia typically pass away from an infection. Jose Castillo verbalized understanding and will return the most form after she confirms these directives with her brothers.

## 2018-02-09 NOTE — Assessment & Plan Note (Signed)
Progressive decline with less wakeful periods and verbalization. He has a DNR. I offered his daughter Jose Castillo a most form 1 week ago. She is going to review the form with her brothers and then return to me. I went over it with her and we agreed that it would be best to avoid hospitalizations. Also, he is not a candidate for a feeding tube and she agrees with this as well. Due to his combative behavior he would likely not tolerate an IV and therefore frequent labs are not recommended. His goals of care are comfort based and given his poor quality of life I explained that antibiotics to prolong his life would not be helpful as people with dementia typically pass away from an infection. Jose Castillo verbalized understanding and will return the most form after she confirms these directives with her brothers.

## 2018-03-18 ENCOUNTER — Other Ambulatory Visit: Payer: Self-pay

## 2018-03-18 ENCOUNTER — Emergency Department (HOSPITAL_COMMUNITY)
Admission: EM | Admit: 2018-03-18 | Discharge: 2018-03-19 | Disposition: A | Discharge status: Home / Self Care | Payer: PPO | Attending: Emergency Medicine | Admitting: Emergency Medicine

## 2018-03-18 DIAGNOSIS — Z7902 Long term (current) use of antithrombotics/antiplatelets: Secondary | ICD-10-CM | POA: Insufficient documentation

## 2018-03-18 DIAGNOSIS — Y9389 Activity, other specified: Secondary | ICD-10-CM | POA: Diagnosis not present

## 2018-03-18 DIAGNOSIS — F329 Major depressive disorder, single episode, unspecified: Secondary | ICD-10-CM | POA: Insufficient documentation

## 2018-03-18 DIAGNOSIS — Z85828 Personal history of other malignant neoplasm of skin: Secondary | ICD-10-CM | POA: Diagnosis not present

## 2018-03-18 DIAGNOSIS — F0390 Unspecified dementia without behavioral disturbance: Secondary | ICD-10-CM | POA: Diagnosis not present

## 2018-03-18 DIAGNOSIS — W19XXXA Unspecified fall, initial encounter: Secondary | ICD-10-CM | POA: Diagnosis not present

## 2018-03-18 DIAGNOSIS — I2510 Atherosclerotic heart disease of native coronary artery without angina pectoris: Secondary | ICD-10-CM | POA: Diagnosis not present

## 2018-03-18 DIAGNOSIS — Z955 Presence of coronary angioplasty implant and graft: Secondary | ICD-10-CM | POA: Insufficient documentation

## 2018-03-18 DIAGNOSIS — W050XXA Fall from non-moving wheelchair, initial encounter: Secondary | ICD-10-CM | POA: Diagnosis not present

## 2018-03-18 DIAGNOSIS — Z96659 Presence of unspecified artificial knee joint: Secondary | ICD-10-CM | POA: Insufficient documentation

## 2018-03-18 DIAGNOSIS — S01112A Laceration without foreign body of left eyelid and periocular area, initial encounter: Secondary | ICD-10-CM | POA: Diagnosis not present

## 2018-03-18 DIAGNOSIS — R456 Violent behavior: Secondary | ICD-10-CM | POA: Diagnosis not present

## 2018-03-18 DIAGNOSIS — Y998 Other external cause status: Secondary | ICD-10-CM | POA: Insufficient documentation

## 2018-03-18 DIAGNOSIS — S199XXA Unspecified injury of neck, initial encounter: Secondary | ICD-10-CM | POA: Diagnosis not present

## 2018-03-18 DIAGNOSIS — R0902 Hypoxemia: Secondary | ICD-10-CM | POA: Diagnosis not present

## 2018-03-18 DIAGNOSIS — R58 Hemorrhage, not elsewhere classified: Secondary | ICD-10-CM | POA: Diagnosis not present

## 2018-03-18 DIAGNOSIS — E119 Type 2 diabetes mellitus without complications: Secondary | ICD-10-CM | POA: Insufficient documentation

## 2018-03-18 DIAGNOSIS — Z79899 Other long term (current) drug therapy: Secondary | ICD-10-CM | POA: Diagnosis not present

## 2018-03-18 DIAGNOSIS — S0990XA Unspecified injury of head, initial encounter: Secondary | ICD-10-CM

## 2018-03-18 DIAGNOSIS — Y92129 Unspecified place in nursing home as the place of occurrence of the external cause: Secondary | ICD-10-CM | POA: Diagnosis not present

## 2018-03-18 DIAGNOSIS — Z87891 Personal history of nicotine dependence: Secondary | ICD-10-CM | POA: Insufficient documentation

## 2018-03-18 MED ORDER — LIDOCAINE HCL (PF) 1 % IJ SOLN
5.0000 mL | Freq: Once | INTRAMUSCULAR | Status: AC
  Administered 2018-03-18: 5 mL via INTRADERMAL
  Filled 2018-03-18: qty 5

## 2018-03-18 NOTE — ED Notes (Signed)
Jose Castillo (Daughter) [phone].

## 2018-03-18 NOTE — ED Provider Notes (Signed)
[facility] EMERGENCY DEPARTMENT Provider Note   CSN: [id number] Arrival date & time: 03/18/18  2201     History   Chief Complaint Chief Complaint  Patient presents with  . Fall  . Head Laceration    HPI Jose Castillo is a 77 y.o. male.  The history is provided by the patient and medical records.     Level 5 caveat: Dementia 77 year old male with history of diabetes, hyperlipidemia, peripheral neuropathy, essential tremor, hyperlipidemia, coronary artery disease, baseline gait abnormality, presenting to the ED from [facility] facility following a fall.  Facility reports that patient tends to rock back and forth in his wheelchair and today tipped himself over and hit his head on the floor.  There was no loss of consciousness.  He has a laceration to the left eyebrow.  Facility reports he is at his baseline, generally nonverbal but will mumble occasionally.  He has not had any recent illness, fevers, or acute changes in behavior.  He is on Plavix.  Has an active DNR with him today.  Unsure of tetanus status.  Past Medical History:  Diagnosis Date  . Dementia   . Diabetes ([place])   . Heart disease   . Hyperlipidemia   . Hypotension     Patient Active Problem List   Diagnosis Date Noted  . Peripheral neuropathy 02/09/2018  . Constipation 06/22/2017  . Gait abnormality 11/13/2016  . Hyperuricemia 10/31/2016  . Essential tremor 09/04/2016  . 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 Frontal dementia 09/04/2016  . Type 2 diabetes mellitus with neurological complications ([place]) 24/40/1027  . Diabetic peripheral neuropathy associated with type 2 diabetes mellitus ([place]) 07/05/2016  . Partial retinal detachment of left eye 04/10/2016  . Benign hypertensive heart disease without heart failure 04/10/2016  . OAB (overactive bladder) 04/10/2016  . 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 Coronary artery disease of native artery of native heart with  stable angina pectoris ([place]) 04/10/2016  . Hyperlipidemia associated with type 2 diabetes mellitus ([place]) 04/05/2016  . Diabetes mellitus type 2 without retinopathy ([place]) 04/05/2016  . BPH without obstruction/lower urinary tract symptoms 04/05/2016  . Bilateral primary osteoarthritis of knee 04/05/2016    Past Surgical History:  Procedure Laterality Date  . APPENDECTOMY  1961  . CORONARY ANGIOPLASTY WITH STENT PLACEMENT  2016  . TONSILLECTOMY AND ADENOIDECTOMY    . TOTAL KNEE ARTHROPLASTY  2012        Home Medications    Prior to Admission medications   Medication Sig Start Date End Date Taking? Authorizing Provider  acetaminophen (TYLENOL) 650 MG CR tablet Take 650 mg by mouth daily.    [provider]  allopurinol (ZYLOPRIM) 100 MG tablet Take 1 tablet (100 mg total) by mouth daily. 07/26/16   Reed, Tiffany L, DO  bisacodyl (DULCOLAX) 10 MG suppository Place 10 mg rectally as needed for moderate constipation.    [provider]  carbamazepine (TEGRETOL) 100 MG chewable tablet Chew 200 mg by mouth every morning. 400mg  at bedtime    [provider]  clopidogrel (PLAVIX) 75 MG tablet Take 1 tablet (75 mg total) by mouth daily. 07/26/16   Reed, Tiffany L, DO  gabapentin (NEURONTIN) 100 MG capsule Take 100 mg by mouth daily. Give 200mg  by mouth at bedtime    [provider]  GLUCERNA (GLUCERNA) LIQD Take 237 mLs by mouth daily.    [provider]  hydrOXYzine (VISTARIL) 25 MG capsule Take 25 mg  by mouth at bedtime.    [provider]  ipratropium-albuterol (DUONEB) 0.5-2.5 (3) MG/3ML SOLN Take 3 mLs by nebulization 3 (three) times daily as needed (cough).    [provider]  nitroGLYCERIN (NITROSTAT) 0.4 MG SL tablet Place 0.4 mg under the tongue every 5 (five) minutes as needed for chest pain.    [provider]  polyethylene glycol (MIRALAX / GLYCOLAX) packet Take 17 g by mouth daily.    [provider]    senna-docusate (SENOKOT-S) 8.6-50 MG tablet Take 1 tablet by mouth 2 (two) times daily.    [provider]  vitamin B-12 (CYANOCOBALAMIN) 1000 MCG tablet Take 1 tablet (1,000 mcg total) by mouth daily. 07/26/16   [name], DO    Family History Family History  Problem Relation Age of Onset  . Cancer Mother 15       Lymphoma  . Heart attack Father 57  . Diabetes Sister   . Brain cancer Sister     Social History Social History   Tobacco Use  . Smoking status: Former Smoker    Packs/day: 0.50    Years: 20.00    Pack years: 10.00    Types: Cigarettes    Last attempt to quit: 2003    Years since quitting: 16.7  . Smokeless tobacco: Never Used  Substance Use Topics  . Alcohol use: No    Comment: Quit 2013  . Drug use: No     Allergies   Lortab [hydrocodone-acetaminophen] and Morphine and related   Review of Systems Review of Systems  Unable to perform ROS: Other     Physical Exam Updated Vital Signs There were no vitals taken for this visit.  Physical Exam  Constitutional: He is oriented to person, place, and time. He appears well-developed and well-nourished.  HENT:  Head: Normocephalic and atraumatic.  Mouth/Throat: Oropharynx is clear and moist.  Eyes: Pupils are equal, round, and reactive to light. Conjunctivae and EOM are normal.    Irregular T-shaped laceration to left eyebrow, no active bleeding; no apparent bony deformity  Neck: Normal range of motion.  Cardiovascular: Normal rate, regular rhythm and normal heart sounds.  Pulmonary/Chest: Effort normal and breath sounds normal.  Abdominal: Soft. Bowel sounds are normal.  Musculoskeletal: Normal range of motion.  Neurological: He is alert and oriented to person, place, and time.  Skin: Skin is warm and dry.  Psychiatric: He has a normal mood and affect.  Nursing note and vitals reviewed.    ED Treatments / Results  Labs (all labs ordered are listed, but only abnormal results are  displayed) Labs Reviewed - No data to display  EKG None  Radiology No results found.  Procedures Procedures (including critical care time)  LACERATION REPAIR Performed by: Larene Pickett Authorized by: Larene Pickett Consent: Verbal consent obtained. Risks and benefits: risks, benefits and alternatives were discussed Consent given by: patient Patient identity confirmed: provided demographic data Prepped and Draped in normal sterile fashion Wound explored  Laceration Location: left eyebrow  Laceration Length: 5cm, Irregular and T shaped  No Foreign Bodies seen or palpated  Anesthesia: local infiltration  Local anesthetic: lidocaine 1% without epinephrine  Anesthetic total: 5 ml  Irrigation method: syringe Amount of cleaning: standard  Skin closure: 4-0 prolene  Number of sutures: 5  Technique: simple interrupted  Patient tolerance: Patient tolerated the procedure well with no immediate complications.   Medications Ordered in ED Medications - No data to display   Initial  Impression / Assessment and Plan / ED Course  I have reviewed the triage vital signs and the nursing notes.  Pertinent labs & imaging results that were available during my care of the patient were reviewed by me and considered in my medical decision making (see chart for details).  77 year old male here after a fall at his nursing facility.  Per staff report, he tends to rock back and forth in his wheelchair and not himself over today striking his head on the floor.  No loss of consciousness.  Has irregular laceration of left eyebrow without associated bony deformity.  Mid-face stable.  Patient has history of dementia, limited interaction on exam which facility staff reports is baseline.  He is on Plavix, will proceed with head and neck CT.  No other reported behavioral changes, fever, or other signs of illness.  Patient's daughter has arrived, I have spoken with her about the events of today.   She was present while I repaired laceration of his head.  She believes his tetanus is UTD.  She acknowledges this is his baseline behavior.  She has not noticed any acute changes and and was not notified of any fever or other illness at facility.  Still awaiting head CT, however daughter needed to go home to prepare for work tomorrow.  Informed that I will call her if any acute abnormalities on the head CT.  I discussed with her suture removal at facility in 7-10 days and advised it will be on d/c instructions for his nurse.  Head and neck CT negative for any acute intracranial findings.  Patient has been resting comfortably here.  Stable for discharge.  [facility] called for transport.  D/c instructions printed for facility.  Final Clinical Impressions(s) / ED Diagnoses   Final diagnoses:  Injury of head, initial encounter  Laceration of left eyebrow, initial encounter    ED Discharge Orders    None       Larene Pickett, PA-C 03/19/18 0401    Maudie Flakes, MD 03/19/18 1640

## 2018-03-18 NOTE — ED Triage Notes (Signed)
Per [facility], pt arrives from [facility] where he tipped his wheel chair onto himself. EMS denies LOC. Per EMS, pt is at baseline mentallty. Pt has a hx of dementia. Pt has deep laceration to left eyebrow. Pt is on plavix.

## 2018-03-19 ENCOUNTER — Non-Acute Institutional Stay (SKILLED_NURSING_FACILITY): Payer: PPO | Admitting: Internal Medicine

## 2018-03-19 ENCOUNTER — Encounter: Payer: Self-pay | Admitting: Internal Medicine

## 2018-03-19 ENCOUNTER — Emergency Department (HOSPITAL_COMMUNITY): Payer: PPO

## 2018-03-19 DIAGNOSIS — E119 Type 2 diabetes mellitus without complications: Secondary | ICD-10-CM

## 2018-03-19 DIAGNOSIS — E1142 Type 2 diabetes mellitus with diabetic polyneuropathy: Secondary | ICD-10-CM

## 2018-03-19 DIAGNOSIS — I25118 Atherosclerotic heart disease of native coronary artery with other forms of angina pectoris: Secondary | ICD-10-CM

## 2018-03-19 DIAGNOSIS — F0280 Dementia in other diseases classified elsewhere without behavioral disturbance: Secondary | ICD-10-CM | POA: Diagnosis not present

## 2018-03-19 DIAGNOSIS — M170 Bilateral primary osteoarthritis of knee: Secondary | ICD-10-CM | POA: Diagnosis not present

## 2018-03-19 DIAGNOSIS — S0990XA Unspecified injury of head, initial encounter: Secondary | ICD-10-CM | POA: Diagnosis not present

## 2018-03-19 DIAGNOSIS — S199XXA Unspecified injury of neck, initial encounter: Secondary | ICD-10-CM | POA: Diagnosis not present

## 2018-03-19 DIAGNOSIS — E790 Hyperuricemia without signs of inflammatory arthritis and tophaceous disease: Secondary | ICD-10-CM

## 2018-03-19 DIAGNOSIS — G3109 Other frontotemporal dementia: Secondary | ICD-10-CM

## 2018-03-19 NOTE — ED Notes (Signed)
Patient transported to CT 

## 2018-03-19 NOTE — Progress Notes (Signed)
Patient ID: Jose Castillo, male   DOB: Oct 28, 1940, 77 y.o.   MRN: [id number]  Location:  [facility] Room Number: 403 Place of Service:  SNF ([phone]) Provider:  [name], DO  Patient Care Team: [name], DO as PCP - General (Geriatric Medicine)  Extended Emergency Contact Information Primary Emergency Contact: Alley,Kelly Address: [address]          [place], [place] [address] [name] Phone: [phone] Relation: Daughter Secondary Emergency Contact: Vantassell,[name] Address: [address] [place] [address] [place] of [place] Phone: [phone] Relation: Son  Code Status:  DNR Goals of care: Advanced Directive information Advanced Directives 03/19/2018  Does Patient Have a Medical Advance Directive? Yes  Type of Paramedic of [place];Out of facility DNR (pink MOST or yellow form)  Does patient want to make changes to medical advance directive? No - Patient declined  Copy of [place] in Chart? Yes  Pre-existing out of facility DNR order (yellow form or pink MOST form) Yellow form placed in chart (order not valid for inpatient use)     Chief Complaint  Patient presents with  . Medical Management of Chronic Issues    Routine Visit    HPI:  Pt is a 77 y.o. male seen today for medical management of chronic diseases.    Last night, Jose Castillo was rocking his wheelchair which he's known to do in the evenings when he is more agitated and sundowns.  He got moving so quickly and aggressively that he managed to tip his wheelchair forward and struck the left side of his forehead above the eyebrow on the floor.  He sustained a Y shaped laceration that bled profusely and required an ED visit for sutures.  CT brain and neck were performed per ED protocol.  This revealed:  1. Atrophy with chronic moderate small vessel ischemic disease. No acute intracranial  abnormality. 2. Cervical spondylosis.  No acute cervical spine fracture. 3. Minimal grade 1 anterolisthesis of C4 on C5 and retrolisthesis of C5 on C6. 4. Hazy opacity involving the posterior left upper lobe question pneumonia versus hypoventilatory change or possibly alveolitis/pneumonitis. Pt did not have pulmonary symptoms. The wound was sutures and nursing to remove sutures per ED instructions.    Resident overall is unchanged today sitting up in his wheelchair which staff are keeping in the tilted back position to prevent this type of fall from recurring.  He continues to be sleepy through mid to late afternoon and then begin to wake up and grabs his legs and feet like he's stretching, rocks his chair and moans sometimes.  He's been followed by Dr. Casimiro Needle for his dementia behaviors and has been on tegretol, vistaril for this.  Bowels are moving well with his miralax and senokot  He remains on gabapentin due to history of neuropathy from diabetes.  He gets glucerna shakes.  Weight has been stable at 158 lbs the past couple of months.  He'd actually gained weight after moving from memory care to skilled.  Past Medical History:  Diagnosis Date  . Dementia   . Diabetes ([place])   . Heart disease   . Hyperlipidemia   . Hypotension    Past Surgical History:  Procedure Laterality Date  . APPENDECTOMY  1961  . CORONARY ANGIOPLASTY WITH STENT PLACEMENT  2016  . TONSILLECTOMY AND ADENOIDECTOMY    .  TOTAL KNEE ARTHROPLASTY  2012    Allergies  Allergen Reactions  . Lortab [Hydrocodone-Acetaminophen] Other (See Comments)    Reaction:  Affected pts cognition   . Morphine And Related Other (See Comments)    Reaction:  Affected pts cognition     Outpatient Encounter Medications as of 03/19/2018  Medication Sig  . acetaminophen (TYLENOL) 325 MG tablet Take 650 mg by mouth daily.  Marland Kitchen allopurinol (ZYLOPRIM) 100 MG tablet Take 1 tablet (100 mg total) by mouth daily.  . bisacodyl  (DULCOLAX) 10 MG suppository Place 10 mg rectally daily as needed (constipation).   . carbamazepine (TEGRETOL) 200 MG tablet Take 200 mg by mouth every morning. 2 tablets (400 mg) at bedtime  . clopidogrel (PLAVIX) 75 MG tablet Take 1 tablet (75 mg total) by mouth daily.  Marland Kitchen gabapentin (NEURONTIN) 100 MG capsule Take 100 mg by mouth every morning. 2 capsules (200 mg) at bedtime  . GLUCERNA (GLUCERNA) LIQD Take 237 mLs by mouth daily.  . hydrOXYzine (VISTARIL) 25 MG capsule Take 25 mg by mouth at bedtime.  Marland Kitchen ipratropium-albuterol (DUONEB) 0.5-2.5 (3) MG/3ML SOLN Take 3 mLs by nebulization 3 (three) times daily as needed (shortness of breath/cough).   . nitroGLYCERIN (NITROSTAT) 0.4 MG SL tablet Place 0.4 mg under the tongue every 5 (five) minutes as needed for chest pain.  . polyethylene glycol (MIRALAX / GLYCOLAX) packet Take 17 g by mouth daily.  Marland Kitchen senna-docusate (SENOKOT-S) 8.6-50 MG tablet Take 1 tablet by mouth 2 (two) times daily.  . vitamin B-12 (CYANOCOBALAMIN) 1000 MCG tablet Take 1 tablet (1,000 mcg total) by mouth daily.   No facility-administered encounter medications on file as of 03/19/2018.     Review of Systems  Immunization History  Administered Date(s) Administered  . Influenza-Unspecified 05/05/2010, 04/11/2013, 03/03/2015, 09/06/2016, 05/10/2017  . Pneumococcal Polysaccharide-23 12/25/2010  . Pneumococcal-Unspecified 02/28/2013  . Td 06/01/2017  . Tdap 03/22/2006  . Zoster 11/01/2012  . Zoster Recombinat (Shingrix) 05/25/2017, 09/25/2017   Pertinent  Health Maintenance Due  Topic Date Due  . HEMOGLOBIN A1C  12/22/2017  . INFLUENZA VACCINE  01/31/2018  . FOOT EXAM  03/15/2018  . OPHTHALMOLOGY EXAM  06/22/2018 (Originally 01/15/1951)  . PNA vac Low Risk Adult  Completed  . COLONOSCOPY  Discontinued   Fall Risk  05/29/2017 04/05/2016  Falls in the past year? No Yes  Number falls in past yr: - 1  Injury with Fall? - Yes   Functional Status Survey:    Vitals:    03/19/18 1405  BP: 128/77  Pulse: 60  Resp: 18  Temp: 98.1 F (36.7 C)  TempSrc: Oral  SpO2: 91%  Weight: 158 lb (71.7 kg)  Height: 5\' 5"  (1.651 m)   Body mass index is 26.29 kg/m. Physical Exam  Labs reviewed: Recent Labs    06/23/17  NA 142  K 4.3  BUN 16  CREATININE 0.9   Recent Labs    06/23/17  AST 12*  ALT 16  ALKPHOS 171*   Recent Labs    06/23/17  WBC 7.5  HGB 12.9*  HCT 39*  PLT 194   Lab Results  Component Value Date   TSH 0.77 10/31/2016   Lab Results  Component Value Date   HGBA1C 6.8 06/23/2017   Lab Results  Component Value Date   CHOL 124 10/26/2016   HDL 40 10/26/2016   LDLCALC 62 10/26/2016   TRIG 111 10/26/2016    Significant Diagnostic Results in last 30 days:  Ct Head  Wo Contrast  Result Date: 03/19/2018 CLINICAL DATA:  Patient on anti coagulation is sustained head trauma after tipping wheelchair onto himself. Laceration of the left eyebrow. EXAM: CT HEAD WITHOUT CONTRAST CT CERVICAL SPINE WITHOUT CONTRAST TECHNIQUE: Multidetector CT imaging of the head and cervical spine was performed following the standard protocol without intravenous contrast. Multiplanar CT image reconstructions of the cervical spine were also generated. COMPARISON:  11/07/2016 FINDINGS: CT HEAD FINDINGS Brain: Atrophy with chronic moderate small vessel ischemia. Chronic bilateral insular white matter infarcts. No acute intracranial hemorrhage. No extra-axial fluid collections. No large vascular territory infarction, midline shift nor intra-axial mass. Midline fourth ventricle and basal cisterns. No hydrocephalus. Vascular: No hyperdense vessel sign. Atherosclerosis of the carotid siphons bilaterally. Skull: No acute skull fracture or suspicious osseous lesions. Sinuses/Orbits: Small mucous retention cyst along the anterior left maxillary sinus. Intact orbits and globes. Other: Left supraorbital soft tissue swelling and laceration. CT CERVICAL SPINE FINDINGS Limited by  patient motion artifact. Alignment: Maintained cervical lordosis. Mild grade 1 anterolisthesis of C4 on C5 and retrolisthesis of C5 on C6. Skull base and vertebrae: No acute skull nor cervical spine fracture. No aggressive osseous lesions. Soft tissues and spinal canal: No prevertebral soft tissue swelling. No visible canal hematoma. Disc levels: No significant central canal stenosis. Mild-to-moderate foraminal encroachment C5-6 bilaterally secondary to uncinate spurring. Moderate-to-marked disc flattening C2 through T2 greatest at C5-6. Uncovertebral joint osteoarthritis bilaterally with uncinate spurring and facet arthrosis. Upper chest: Paraseptal emphysema at the apices. Slightly more confluent pulmonary opacities along the posterior aspect of the left upper lobe, question passive atelectasis versus alveolitis/pneumonitis or pneumonia. Other: Bilateral extracranial carotid arteriosclerosis. IMPRESSION: 1. Atrophy with chronic moderate small vessel ischemic disease. No acute intracranial abnormality. 2. Cervical spondylosis.  No acute cervical spine fracture. 3. Minimal grade 1 anterolisthesis of C4 on C5 and retrolisthesis of C5 on C6. 4. Hazy opacity involving the posterior left upper lobe question pneumonia versus hypoventilatory change or possibly alveolitis/pneumonitis. Electronically Signed   By: Ashley Royalty M.D.   On: 03/19/2018 02:45   Ct Cervical Spine Wo Contrast  Result Date: 03/19/2018 CLINICAL DATA:  Patient on anti coagulation is sustained head trauma after tipping wheelchair onto himself. Laceration of the left eyebrow. EXAM: CT HEAD WITHOUT CONTRAST CT CERVICAL SPINE WITHOUT CONTRAST TECHNIQUE: Multidetector CT imaging of the head and cervical spine was performed following the standard protocol without intravenous contrast. Multiplanar CT image reconstructions of the cervical spine were also generated. COMPARISON:  11/07/2016 FINDINGS: CT HEAD FINDINGS Brain: Atrophy with chronic moderate  small vessel ischemia. Chronic bilateral insular white matter infarcts. No acute intracranial hemorrhage. No extra-axial fluid collections. No large vascular territory infarction, midline shift nor intra-axial mass. Midline fourth ventricle and basal cisterns. No hydrocephalus. Vascular: No hyperdense vessel sign. Atherosclerosis of the carotid siphons bilaterally. Skull: No acute skull fracture or suspicious osseous lesions. Sinuses/Orbits: Small mucous retention cyst along the anterior left maxillary sinus. Intact orbits and globes. Other: Left supraorbital soft tissue swelling and laceration. CT CERVICAL SPINE FINDINGS Limited by patient motion artifact. Alignment: Maintained cervical lordosis. Mild grade 1 anterolisthesis of C4 on C5 and retrolisthesis of C5 on C6. Skull base and vertebrae: No acute skull nor cervical spine fracture. No aggressive osseous lesions. Soft tissues and spinal canal: No prevertebral soft tissue swelling. No visible canal hematoma. Disc levels: No significant central canal stenosis. Mild-to-moderate foraminal encroachment C5-6 bilaterally secondary to uncinate spurring. Moderate-to-marked disc flattening C2 through T2 greatest at C5-6. Uncovertebral joint osteoarthritis bilaterally with  uncinate spurring and facet arthrosis. Upper chest: Paraseptal emphysema at the apices. Slightly more confluent pulmonary opacities along the posterior aspect of the left upper lobe, question passive atelectasis versus alveolitis/pneumonitis or pneumonia. Other: Bilateral extracranial carotid arteriosclerosis. IMPRESSION: 1. Atrophy with chronic moderate small vessel ischemic disease. No acute intracranial abnormality. 2. Cervical spondylosis.  No acute cervical spine fracture. 3. Minimal grade 1 anterolisthesis of C4 on C5 and retrolisthesis of C5 on C6. 4. Hazy opacity involving the posterior left upper lobe question pneumonia versus hypoventilatory change or possibly alveolitis/pneumonitis.  Electronically Signed   By: Ashley Royalty M.D.   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 Diabetes mellitus type 2 without retinopathy ([place]) -has been very well controlled Lab Results  Component Value Date   HGBA1C 6.8 06/23/2017  we were trying to avoid labs, but probably this should be looked at at least once to twice a year   3. Diabetic peripheral neuropathy associated with type 2 diabetes mellitus ([facility]) -cont gabapentin therapy as he had more agitation when this was previously stopped, he does grab at his feet a lot also  4. Bilateral primary osteoarthritis of knee -cont tylenol for pain to prevent agitation for that  5. Coronary artery disease of native artery of native heart with stable angina pectoris ([place]) -would take him off plavix at this point if [place] and her broth are ok with that  6. Hyperuricemia - might stop allopurinol also and just treat flares if they occur  If we move to an even  more comfort care approach, I would discontinue:  Plavix, B12, maybe allorpurinol also--other meds are comfort or behavior related.  Family/ staff Communication: discussed with snf 3 nurse  Inocencia Murtaugh L. Arvel Oquinn, D.O. [facility] [address], [place] [address] Cell Phone (Mon-Fri 8am-5pm):  [phone] On Call:  [phone] & follow prompts after 5pm & weekends Office Phone:  [phone] Office Fax:  [phone]

## 2018-03-19 NOTE — Discharge Instructions (Signed)
Sutures need to be removed in about 7-10 days. Keep clean and dry until that time. Follow-up with primary care doctor. Return here for any new/acute changes.

## 2018-03-25 ENCOUNTER — Non-Acute Institutional Stay (SKILLED_NURSING_FACILITY): Payer: PPO | Admitting: Adult Health

## 2018-03-25 ENCOUNTER — Encounter: Payer: Self-pay | Admitting: Adult Health

## 2018-03-25 DIAGNOSIS — K047 Periapical abscess without sinus: Secondary | ICD-10-CM

## 2018-03-25 NOTE — Progress Notes (Signed)
Location:  Occupational psychologist of Service:  SNF (31) SNF Provider:   Cindi Carbon, ANP [place] [phone]   [name], DO  Patient Care Team: [name], DO as PCP - General (Geriatric Medicine)  Extended Emergency Contact Information Primary Emergency Contact: Alley,Kelly Address: [address]          [place], [place] [address] [name] of [place] Phone: [phone] Relation: Daughter Secondary Emergency Contact: Hollern,[name] Address: [address] [place] [address] [place] of [place] Phone: [phone] Relation: Son  Code Status:  DNR Goals of care: Advanced Directive information Advanced Directives 03/19/2018  Does Patient Have a Medical Advance Directive? Yes  Type of Paramedic of [place];Out of facility DNR (pink MOST or yellow form)  Does patient want to make changes to medical advance directive? No - Patient declined  Copy of [place] in Chart? Yes  Pre-existing out of facility DNR order (yellow form or pink MOST form) Yellow form placed in chart (order not valid for inpatient use)     Chief Complaint  Patient presents with  . Acute Visit    right jaw swellling    HPI:  Pt is a 77 y.o. male seen today for an acute visit for right swollen jaw present for 1 day. No fever or drainage. The resident has advanced dementia and can not contribute to the history.    Past Medical History:  Diagnosis Date  . Dementia   . Diabetes ([place])   . Heart disease   . Hyperlipidemia   . Hypotension    Past Surgical History:  Procedure Laterality Date  . APPENDECTOMY  1961  . CORONARY ANGIOPLASTY WITH STENT PLACEMENT  2016  . TONSILLECTOMY AND ADENOIDECTOMY    . TOTAL KNEE ARTHROPLASTY  2012    Allergies  Allergen Reactions  . Lortab [Hydrocodone-Acetaminophen] Other (See Comments)    Reaction:  Affected pts cognition   . Morphine And  Related Other (See Comments)    Reaction:  Affected pts cognition     Outpatient Encounter Medications as of 03/25/2018  Medication Sig  . acetaminophen (TYLENOL) 325 MG tablet Take 650 mg by mouth daily.  Marland Kitchen allopurinol (ZYLOPRIM) 100 MG tablet Take 1 tablet (100 mg total) by mouth daily.  . bisacodyl (DULCOLAX) 10 MG suppository Place 10 mg rectally daily as needed (constipation).   . carbamazepine (TEGRETOL) 200 MG tablet Take 200 mg by mouth every morning. 2 tablets (400 mg) at bedtime  . clopidogrel (PLAVIX) 75 MG tablet Take 1 tablet (75 mg total) by mouth daily.  Marland Kitchen gabapentin (NEURONTIN) 100 MG capsule Take 100 mg by mouth every morning. 2 capsules (200 mg) at bedtime  . GLUCERNA (GLUCERNA) LIQD Take 237 mLs by mouth daily.  . hydrOXYzine (VISTARIL) 25 MG capsule Take 25 mg by mouth at bedtime.  Marland Kitchen ipratropium-albuterol (DUONEB) 0.5-2.5 (3) MG/3ML SOLN Take 3 mLs by nebulization 3 (three) times daily as needed (shortness of breath/cough).   . nitroGLYCERIN (NITROSTAT) 0.4 MG SL tablet Place 0.4 mg under the tongue every 5 (five) minutes as needed for chest pain.  . polyethylene glycol (MIRALAX / GLYCOLAX) packet Take 17 g by mouth daily.  Marland Kitchen senna-docusate (SENOKOT-S) 8.6-50 MG tablet Take 1 tablet by mouth 2 (two) times daily.  . vitamin B-12 (CYANOCOBALAMIN) 1000 MCG tablet Take 1 tablet (1,000 mcg total) by mouth daily.  No facility-administered encounter medications on file as of 03/25/2018.     Review of Systems  Unable to perform ROS: Dementia    Immunization History  Administered Date(s) Administered  . Influenza-Unspecified 05/05/2010, 04/11/2013, 03/03/2015, 09/06/2016, 05/10/2017  . Pneumococcal Polysaccharide-23 12/25/2010  . Pneumococcal-Unspecified 02/28/2013  . Td 06/01/2017  . Tdap 03/22/2006  . Zoster 11/01/2012  . Zoster Recombinat (Shingrix) 05/25/2017, 09/25/2017   Pertinent  Health Maintenance Due  Topic Date Due  . HEMOGLOBIN A1C  12/22/2017  .  INFLUENZA VACCINE  01/31/2018  . FOOT EXAM  03/15/2018  . OPHTHALMOLOGY EXAM  06/22/2018 (Originally 01/15/1951)  . PNA vac Low Risk Adult  Completed  . COLONOSCOPY  Discontinued   Fall Risk  05/29/2017 04/05/2016  Falls in the past year? No Yes  Number falls in past yr: - 1  Injury with Fall? - Yes   Functional Status Survey:    Vitals:   03/25/18 1222  Pulse: 60  Resp: 20  Temp: (!) 97.4 F (36.3 C)  SpO2: 94%   There is no height or weight on file to calculate BMI. Physical Exam  Constitutional: No distress.  HENT:  Nose: Nose normal.  Mouth/Throat: Oropharynx is clear and moist. No oropharyngeal exudate.    Bruise to left forehead with small hematoma and sutures in place due to laceration. No redness or drainage  Eyes: Pupils are equal, round, and reactive to light. Conjunctivae are normal. Right eye exhibits no discharge. Left eye exhibits no discharge.  Neck: No JVD present. No tracheal deviation present. No thyromegaly present.  Cardiovascular: Normal rate and regular rhythm.  Pulmonary/Chest: Effort normal and breath sounds normal.  Lymphadenopathy:    He has no cervical adenopathy.  Neurological: He is alert.  Non verbal and not able to f/c  Skin: Skin is warm and dry. He is not diaphoretic.  Psychiatric:  agitated    Labs reviewed: Recent Labs    06/23/17  NA 142  K 4.3  BUN 16  CREATININE 0.9   Recent Labs    06/23/17  AST 12*  ALT 16  ALKPHOS 171*   Recent Labs    06/23/17  WBC 7.5  HGB 12.9*  HCT 39*  PLT 194   Lab Results  Component Value Date   TSH 0.77 10/31/2016   Lab Results  Component Value Date   HGBA1C 6.8 06/23/2017   Lab Results  Component Value Date   CHOL 124 10/26/2016   HDL 40 10/26/2016   LDLCALC 62 10/26/2016   TRIG 111 10/26/2016    Significant Diagnostic Results in last 30 days:  Ct Head Wo Contrast  Result Date: 03/19/2018 CLINICAL DATA:  Patient on anti coagulation is sustained head trauma after tipping  wheelchair onto himself. Laceration of the left eyebrow. EXAM: CT HEAD WITHOUT CONTRAST CT CERVICAL SPINE WITHOUT CONTRAST TECHNIQUE: Multidetector CT imaging of the head and cervical spine was performed following the standard protocol without intravenous contrast. Multiplanar CT image reconstructions of the cervical spine were also generated. COMPARISON:  11/07/2016 FINDINGS: CT HEAD FINDINGS Brain: Atrophy with chronic moderate small vessel ischemia. Chronic bilateral insular white matter infarcts. No acute intracranial hemorrhage. No extra-axial fluid collections. No large vascular territory infarction, midline shift nor intra-axial mass. Midline fourth ventricle and basal cisterns. No hydrocephalus. Vascular: No hyperdense vessel sign. Atherosclerosis of the carotid siphons bilaterally. Skull: No acute skull fracture or suspicious osseous lesions. Sinuses/Orbits: Small mucous retention cyst along the anterior left maxillary sinus. Intact orbits and globes. Other: Left  supraorbital soft tissue swelling and laceration. CT CERVICAL SPINE FINDINGS Limited by patient motion artifact. Alignment: Maintained cervical lordosis. Mild grade 1 anterolisthesis of C4 on C5 and retrolisthesis of C5 on C6. Skull base and vertebrae: No acute skull nor cervical spine fracture. No aggressive osseous lesions. Soft tissues and spinal canal: No prevertebral soft tissue swelling. No visible canal hematoma. Disc levels: No significant central canal stenosis. Mild-to-moderate foraminal encroachment C5-6 bilaterally secondary to uncinate spurring. Moderate-to-marked disc flattening C2 through T2 greatest at C5-6. Uncovertebral joint osteoarthritis bilaterally with uncinate spurring and facet arthrosis. Upper chest: Paraseptal emphysema at the apices. Slightly more confluent pulmonary opacities along the posterior aspect of the left upper lobe, question passive atelectasis versus alveolitis/pneumonitis or pneumonia. Other: Bilateral  extracranial carotid arteriosclerosis. IMPRESSION: 1. Atrophy with chronic moderate small vessel ischemic disease. No acute intracranial abnormality. 2. Cervical spondylosis.  No acute cervical spine fracture. 3. Minimal grade 1 anterolisthesis of C4 on C5 and retrolisthesis of C5 on C6. 4. Hazy opacity involving the posterior left upper lobe question pneumonia versus hypoventilatory change or possibly alveolitis/pneumonitis. Electronically Signed   By: Ashley Royalty M.D.   On: 03/19/2018 02:45   Ct Cervical Spine Wo Contrast  Result Date: 03/19/2018 CLINICAL DATA:  Patient on anti coagulation is sustained head trauma after tipping wheelchair onto himself. Laceration of the left eyebrow. EXAM: CT HEAD WITHOUT CONTRAST CT CERVICAL SPINE WITHOUT CONTRAST TECHNIQUE: Multidetector CT imaging of the head and cervical spine was performed following the standard protocol without intravenous contrast. Multiplanar CT image reconstructions of the cervical spine were also generated. COMPARISON:  11/07/2016 FINDINGS: CT HEAD FINDINGS Brain: Atrophy with chronic moderate small vessel ischemia. Chronic bilateral insular white matter infarcts. No acute intracranial hemorrhage. No extra-axial fluid collections. No large vascular territory infarction, midline shift nor intra-axial mass. Midline fourth ventricle and basal cisterns. No hydrocephalus. Vascular: No hyperdense vessel sign. Atherosclerosis of the carotid siphons bilaterally. Skull: No acute skull fracture or suspicious osseous lesions. Sinuses/Orbits: Small mucous retention cyst along the anterior left maxillary sinus. Intact orbits and globes. Other: Left supraorbital soft tissue swelling and laceration. CT CERVICAL SPINE FINDINGS Limited by patient motion artifact. Alignment: Maintained cervical lordosis. Mild grade 1 anterolisthesis of C4 on C5 and retrolisthesis of C5 on C6. Skull base and vertebrae: No acute skull nor cervical spine fracture. No aggressive osseous  lesions. Soft tissues and spinal canal: No prevertebral soft tissue swelling. No visible canal hematoma. Disc levels: No significant central canal stenosis. Mild-to-moderate foraminal encroachment C5-6 bilaterally secondary to uncinate spurring. Moderate-to-marked disc flattening C2 through T2 greatest at C5-6. Uncovertebral joint osteoarthritis bilaterally with uncinate spurring and facet arthrosis. Upper chest: Paraseptal emphysema at the apices. Slightly more confluent pulmonary opacities along the posterior aspect of the left upper lobe, question passive atelectasis versus alveolitis/pneumonitis or pneumonia. Other: Bilateral extracranial carotid arteriosclerosis. IMPRESSION: 1. Atrophy with chronic moderate small vessel ischemic disease. No acute intracranial abnormality. 2. Cervical spondylosis.  No acute cervical spine fracture. 3. Minimal grade 1 anterolisthesis of C4 on C5 and retrolisthesis of C5 on C6. 4. Hazy opacity involving the posterior left upper lobe question pneumonia versus hypoventilatory change or possibly alveolitis/pneumonitis. Electronically Signed   By: Ashley Royalty M.D.   On: 03/19/2018 02:45    Assessment/Plan  1. Dental infection Swelling and erythema noted with decaying tooth but no abscess. Due to his advanced dementia with combative behaviors he would not be a good candidate to see the dentist. His goals of care are comfort based.  Awaiting most form return from his daughter.   Begin Augmentin 875 mg BID x 7 days Florastor 1 cap BID x 7 days Report if no improvement in swelling.    Family/ staff Communication: staff  Labs/tests ordered:  NA

## 2018-04-15 ENCOUNTER — Non-Acute Institutional Stay (SKILLED_NURSING_FACILITY): Payer: PPO | Admitting: Adult Health

## 2018-04-15 DIAGNOSIS — K5901 Slow transit constipation: Secondary | ICD-10-CM

## 2018-04-15 DIAGNOSIS — E119 Type 2 diabetes mellitus without complications: Secondary | ICD-10-CM | POA: Diagnosis not present

## 2018-04-15 DIAGNOSIS — E1142 Type 2 diabetes mellitus with diabetic polyneuropathy: Secondary | ICD-10-CM

## 2018-04-15 DIAGNOSIS — N400 Enlarged prostate without lower urinary tract symptoms: Secondary | ICD-10-CM | POA: Diagnosis not present

## 2018-04-15 DIAGNOSIS — G3109 Other frontotemporal dementia: Secondary | ICD-10-CM

## 2018-04-15 DIAGNOSIS — F0280 Dementia in other diseases classified elsewhere without behavioral disturbance: Secondary | ICD-10-CM

## 2018-04-15 DIAGNOSIS — E790 Hyperuricemia without signs of inflammatory arthritis and tophaceous disease: Secondary | ICD-10-CM | POA: Diagnosis not present

## 2018-04-15 NOTE — Progress Notes (Signed)
Location:  Occupational psychologist of Service:  SNF (31) Provider:   Cindi Carbon, ANP [place] [phone]  [name], DO  Patient Care Team: [name], DO as PCP - General (Geriatric Medicine)  Extended Emergency Contact Information Primary Emergency Contact: Alley,Kelly Address: [address]          [place], [place] [address] [name] of [place] Phone: [phone] Relation: Daughter Secondary Emergency Contact: Metzger,[name] Address: [address] [place] [address] [place] of [place] Phone: [phone] Relation: Son  Code Status:  DNR Goals of care: Advanced Directive information Advanced Directives 03/19/2018  Does Patient Have a Medical Advance Directive? Yes  Type of Paramedic of [place];Out of facility DNR (pink MOST or yellow form)  Does patient want to make changes to medical advance directive? No - Patient declined  Copy of [place] in Chart? Yes  Pre-existing out of facility DNR order (yellow form or pink MOST form) Yellow form placed in chart (order not valid for inpatient use)     Chief Complaint  Patient presents with  . Medical Management of Chronic Issues    HPI:  Pt is a 77 y.o. male seen today for medical management of chronic diseases.   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 Staff report that he is combative during care. Needs assistance with all ADLs. Requires a hoyer lift for transfers and is incontinent  BPH with frequency: now incontinent with no reported issues upon urination  DM II: off meds Lab Results  Component Value Date   HGBA1C 6.8 06/23/2017   Peripheral neuropathy: no reports of pain, does frequently pull up his pant legs and take off his shoes  Constipation: no reported issues with bowels  Past Medical History:  Diagnosis Date  . Dementia ([place])   .  Diabetes ([place])   . Heart disease   . Hyperlipidemia   . Hypotension    Past Surgical History:  Procedure Laterality Date  . APPENDECTOMY  1961  . CORONARY ANGIOPLASTY WITH STENT PLACEMENT  2016  . TONSILLECTOMY AND ADENOIDECTOMY    . TOTAL KNEE ARTHROPLASTY  2012    Allergies  Allergen Reactions  . Lortab [Hydrocodone-Acetaminophen] Other (See Comments)    Reaction:  Affected pts cognition   . Morphine And Related Other (See Comments)    Reaction:  Affected pts cognition     Outpatient Encounter Medications as of 04/15/2018  Medication Sig  . acetaminophen (TYLENOL) 325 MG tablet Take 650 mg by mouth daily.  Marland Kitchen allopurinol (ZYLOPRIM) 100 MG tablet Take 1 tablet (100 mg total) by mouth daily.  . bisacodyl (DULCOLAX) 10 MG suppository Place 10 mg rectally daily as needed (constipation).   . carbamazepine (TEGRETOL) 200 MG tablet Take 200 mg by mouth every morning. 2 tablets (400 mg) at bedtime  . clopidogrel (PLAVIX) 75 MG tablet Take 1 tablet (75 mg total) by mouth daily.  Marland Kitchen gabapentin (NEURONTIN) 100 MG capsule Take 100 mg by mouth every morning. 2 capsules (200 mg) at bedtime  . GLUCERNA (GLUCERNA) LIQD Take 237 mLs by mouth daily.  . hydrOXYzine (VISTARIL) 25 MG capsule Take 25 mg by mouth at bedtime.  Marland Kitchen ipratropium-albuterol (DUONEB) 0.5-2.5 (3) MG/3ML SOLN Take 3 mLs by nebulization 3 (three) times daily as needed (shortness of breath/cough).   . nitroGLYCERIN (NITROSTAT) 0.4  MG SL tablet Place 0.4 mg under the tongue every 5 (five) minutes as needed for chest pain.  . polyethylene glycol (MIRALAX / GLYCOLAX) packet Take 17 g by mouth daily.  Marland Kitchen senna-docusate (SENOKOT-S) 8.6-50 MG tablet Take 1 tablet by mouth 2 (two) times daily.  . vitamin B-12 (CYANOCOBALAMIN) 1000 MCG tablet Take 1 tablet (1,000 mcg total) by mouth daily.   No facility-administered encounter medications on file as of 04/15/2018.     Review of Systems  Unable to perform ROS: Dementia    Immunization  History  Administered Date(s) Administered  . Influenza-Unspecified 05/05/2010, 04/11/2013, 03/03/2015, 09/06/2016, 05/10/2017  . Pneumococcal Polysaccharide-23 12/25/2010  . Pneumococcal-Unspecified 02/28/2013  . Td 06/01/2017  . Tdap 03/22/2006  . Zoster 11/01/2012  . Zoster Recombinat (Shingrix) 05/25/2017, 09/25/2017   Pertinent  Health Maintenance Due  Topic Date Due  . HEMOGLOBIN A1C  12/22/2017  . INFLUENZA VACCINE  01/31/2018  . FOOT EXAM  03/15/2018  . OPHTHALMOLOGY EXAM  06/22/2018 (Originally 01/15/1951)  . PNA vac Low Risk Adult  Completed  . COLONOSCOPY  Discontinued   Fall Risk  05/29/2017 04/05/2016  Falls in the past year? No Yes  Number falls in past yr: - 1  Injury with Fall? - Yes   Functional Status Survey:    Vitals:   04/24/18 1629  Weight: 158 lb 14.4 oz (72.1 kg)   Body mass index is 26.44 kg/m.  Wt Readings from Last 3 Encounters:  04/24/18 158 lb 14.4 oz (72.1 kg)  03/19/18 158 lb (71.7 kg)  02/09/18 158 lb 3.2 oz (71.8 kg)    Physical Exam  Constitutional: No distress.  HENT:  Head: Normocephalic and atraumatic.  Nose: Nose normal.  Refuses mouth exam  Neck: No JVD present.  Cardiovascular: Normal rate and regular rhythm.  No murmur heard. Pulmonary/Chest: Effort normal and breath sounds normal. No respiratory distress.  Abdominal: Soft. Bowel sounds are normal. He exhibits no distension. There is no tenderness.  Musculoskeletal: He exhibits no edema or tenderness.  Rigidity noted to BUE and BLE  Lymphadenopathy:    He has no cervical adenopathy.  Neurological:  Asleep but arouses to physical stimuli. Not verbal and does not follow commands  Skin: Skin is warm and dry. He is not diaphoretic.  Nursing note and vitals reviewed.   Labs reviewed: Recent Labs    06/23/17  NA 142  K 4.3  BUN 16  CREATININE 0.9   Recent Labs    06/23/17  AST 12*  ALT 16  ALKPHOS 171*   Recent Labs    06/23/17  WBC 7.5  HGB 12.9*  HCT 39*    PLT 194   Lab Results  Component Value Date   TSH 0.77 10/31/2016   Lab Results  Component Value Date   HGBA1C 6.8 06/23/2017   Lab Results  Component Value Date   CHOL 124 10/26/2016   HDL 40 10/26/2016   LDLCALC 62 10/26/2016   TRIG 111 10/26/2016    Significant Diagnostic Results in last 30 days:  No results found.  Assessment/Plan  1. Diabetes mellitus type 2 without retinopathy ([place]) Diet controlled Goal <8% Avoid aggressive testing due to his advanced dementia  2. 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 BPH without obstruction/lower urinary tract symptoms No issues Incontinent  at this point  5. Hyperuricemia No new exacerbations  Continue allopurinol 100 mg qd  6. Slow transit constipation Controlled with miralax and senokot s    Family/ staff Communication: staff  Labs/tests ordered:  NA

## 2018-04-24 ENCOUNTER — Encounter: Payer: Self-pay | Admitting: Adult Health

## 2018-05-13 ENCOUNTER — Non-Acute Institutional Stay (SKILLED_NURSING_FACILITY): Payer: PPO | Admitting: Adult Health

## 2018-05-13 DIAGNOSIS — E1142 Type 2 diabetes mellitus with diabetic polyneuropathy: Secondary | ICD-10-CM

## 2018-05-13 DIAGNOSIS — F0280 Dementia in other diseases classified elsewhere without behavioral disturbance: Secondary | ICD-10-CM

## 2018-05-13 DIAGNOSIS — I119 Hypertensive heart disease without heart failure: Secondary | ICD-10-CM | POA: Diagnosis not present

## 2018-05-13 DIAGNOSIS — R269 Unspecified abnormalities of gait and mobility: Secondary | ICD-10-CM

## 2018-05-13 DIAGNOSIS — E1149 Type 2 diabetes mellitus with other diabetic neurological complication: Secondary | ICD-10-CM

## 2018-05-13 DIAGNOSIS — G3109 Other frontotemporal dementia: Secondary | ICD-10-CM

## 2018-05-16 ENCOUNTER — Encounter: Payer: Self-pay | Admitting: Adult Health

## 2018-05-16 NOTE — Progress Notes (Signed)
Location:  Occupational psychologist of Service:  SNF (31) Provider:   Cindi Carbon, ANP [place] [phone]   [name], DO  Patient Care Team: [name], DO as PCP - General (Geriatric Medicine)  Extended Emergency Contact Information Primary Emergency Contact: Alley,Kelly Address: [address]          [place], [place] [address] [name] of [place] Phone: [phone] Relation: Daughter Secondary Emergency Contact: Malenfant,[name] Address: [address] [place] [address] [place] of [place] Phone: [phone] Relation: Son  Code Status:  DNR Goals of care: Advanced Directive information Advanced Directives 03/19/2018  Does Patient Have a Medical Advance Directive? Yes  Type of Paramedic of [employer];Out of facility DNR (pink MOST or yellow form)  Does patient want to make changes to medical advance directive? No - Patient declined  Copy of [place] in Chart? Yes  Pre-existing out of facility DNR order (yellow form or pink MOST form) Yellow form placed in chart (order not valid for inpatient use)     Chief Complaint  Patient presents with  . Medical Management of Chronic Issues    HPI:  Pt is a 77 y.o. male seen today for medical management of chronic diseases.    He has had two falls in the past two months related to going over his chair and into the floor. In sept he had a forehead hematoma and laceration and in Nov he had a skin tear. The staff are working to monitor him closely, keep his chair tilted and arm rest positioned properly to avoid further falls. 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 He tends to kick his feet and can be physically aggressively during personal care. Dr Casimiro Needle placed him on Tegretol and vistaril which seems to help.   Neuropathy to feet: on  neurotin w/o s/s of pain  Dm II: off meds Lab Results  Component Value Date   HGBA1C 6.8 06/23/2017    CAD: on plavix with hx of stent  HLD: no longer monitoring lipids due to severe dementia and goals of care.     Past Medical History:  Diagnosis Date  . Dementia ([place])   . Diabetes ([place])   . Heart disease   . Hyperlipidemia   . Hypotension    Past Surgical History:  Procedure Laterality Date  . APPENDECTOMY  1961  . CORONARY ANGIOPLASTY WITH STENT PLACEMENT  2016  . TONSILLECTOMY AND ADENOIDECTOMY    . TOTAL KNEE ARTHROPLASTY  2012    Allergies  Allergen Reactions  . Lortab [Hydrocodone-Acetaminophen] Other (See Comments)    Reaction:  Affected pts cognition   . Morphine And Related Other (See Comments)    Reaction:  Affected pts cognition     Outpatient Encounter Medications as of 05/13/2018  Medication Sig  . acetaminophen (TYLENOL) 325 MG tablet Take 650 mg by mouth daily.  Marland Kitchen allopurinol (ZYLOPRIM) 100 MG tablet Take 1 tablet (100 mg total) by mouth daily.  . bisacodyl (DULCOLAX) 10 MG suppository Place 10 mg rectally daily as needed (constipation).   . carbamazepine (TEGRETOL) 200 MG tablet Take 200 mg by mouth every morning. 2 tablets (400 mg) at bedtime  . clopidogrel (PLAVIX) 75 MG tablet Take 1 tablet (75 mg total) by mouth daily.  Marland Kitchen gabapentin (NEURONTIN) 100  MG capsule Take 100 mg by mouth every morning. 2 capsules (200 mg) at bedtime  . GLUCERNA (GLUCERNA) LIQD Take 237 mLs by mouth daily.  . hydrOXYzine (VISTARIL) 25 MG capsule Take 25 mg by mouth at bedtime.  Marland Kitchen ipratropium-albuterol (DUONEB) 0.5-2.5 (3) MG/3ML SOLN Take 3 mLs by nebulization 3 (three) times daily as needed (shortness of breath/cough).   . nitroGLYCERIN (NITROSTAT) 0.4 MG SL tablet Place 0.4 mg under the tongue every 5 (five) minutes as needed for chest pain.  . polyethylene glycol (MIRALAX / GLYCOLAX) packet Take 17 g by mouth daily.  Marland Kitchen senna-docusate (SENOKOT-S) 8.6-50 MG tablet Take 1  tablet by mouth 2 (two) times daily.  . vitamin B-12 (CYANOCOBALAMIN) 1000 MCG tablet Take 1 tablet (1,000 mcg total) by mouth daily.   No facility-administered encounter medications on file as of 05/13/2018.     Review of Systems  Unable to perform ROS: Dementia    Immunization History  Administered Date(s) Administered  . Influenza,inj,Quad PF,6+ Mos 04/23/2018  . Influenza-Unspecified 05/05/2010, 04/11/2013, 03/03/2015, 09/06/2016, 05/10/2017  . Pneumococcal Polysaccharide-23 12/25/2010  . Pneumococcal-Unspecified 02/28/2013  . Td 06/01/2017  . Tdap 03/22/2006  . Zoster 11/01/2012  . Zoster Recombinat (Shingrix) 05/25/2017, 09/25/2017   Pertinent  Health Maintenance Due  Topic Date Due  . HEMOGLOBIN A1C  12/22/2017  . OPHTHALMOLOGY EXAM  06/22/2018 (Originally 01/15/1951)  . FOOT EXAM  05/14/2019  . INFLUENZA VACCINE  Completed  . PNA vac Low Risk Adult  Completed  . COLONOSCOPY  Discontinued   Fall Risk  05/29/2017 04/05/2016  Falls in the past year? No Yes  Number falls in past yr: - 1  Injury with Fall? - Yes   Functional Status Survey:    Vitals:   05/16/18 1642  Weight: 160 lb 1.6 oz (72.6 kg)   Body mass index is 26.64 kg/m. Physical Exam  Constitutional: No distress.  HENT:  Head: Normocephalic and atraumatic.  Mouth/Throat: No oropharyngeal exudate.  Neck: No JVD present. No tracheal deviation present. No thyromegaly present.  Cardiovascular: Regular rhythm.  No murmur heard. HR 52  Pulmonary/Chest: Effort normal and breath sounds normal. No respiratory distress. He has no wheezes.  Abdominal: Soft. Bowel sounds are normal. He exhibits no distension. There is no tenderness.  Musculoskeletal:  Decreased ROM to both knees, elbows and hips.   Lymphadenopathy:    He has no cervical adenopathy.  Neurological:  Sleepy but arouses to verbal stim  Skin: Skin is warm and dry. He is not diaphoretic.  Psychiatric: He has a normal mood and affect.    Labs  reviewed: Recent Labs    06/23/17  NA 142  K 4.3  BUN 16  CREATININE 0.9   Recent Labs    06/23/17  AST 12*  ALT 16  ALKPHOS 171*   Recent Labs    06/23/17  WBC 7.5  HGB 12.9*  HCT 39*  PLT 194   Lab Results  Component Value Date   TSH 0.77 10/31/2016   Lab Results  Component Value Date   HGBA1C 6.8 06/23/2017   Lab Results  Component Value Date   CHOL 124 10/26/2016   HDL 40 10/26/2016   LDLCALC 62 10/26/2016   TRIG 111 10/26/2016    Significant Diagnostic Results in last 30 days:  No results found.  Assessment/Plan  1. Diabetic peripheral neuropathy associated with type 2 diabetes mellitus (HCC) Continue neurontin 100 mg qd  2. Type 2 diabetes mellitus with neurological complications ([place]) Diet controlled Avoid  aggressive blood sugar control as he is not able to verbalize s/s hypoglycemia  3. 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 Benign hypertensive heart disease without heart failure BP controlled Off statin due to lack of benefit Remains on plavix  5. Gait abnormality No longer ambulatory due to dementia but he continues try to get up without help.  Fall prec in place Staff to work on positioning to prevent falls   Family/ staff Communication: staff Labs/tests ordered:  Annual labs due in Dec

## 2018-05-17 ENCOUNTER — Encounter: Payer: Self-pay | Admitting: Adult Health

## 2018-05-17 DIAGNOSIS — Z79899 Other long term (current) drug therapy: Secondary | ICD-10-CM | POA: Diagnosis not present

## 2018-05-17 LAB — HEPATIC FUNCTION PANEL
ALT: 11 (ref 10–40)
AST: 11 — AB (ref 14–40)
Alkaline Phosphatase: 106 (ref 25–125)
Bilirubin, Total: 0.2

## 2018-05-17 LAB — BASIC METABOLIC PANEL
BUN: 23 — AB (ref 4–21)
Creatinine: 0.9 (ref 0.6–1.3)
Glucose: 123
Potassium: 4.6 (ref 3.4–5.3)
Sodium: 140 (ref 137–147)

## 2018-05-19 DIAGNOSIS — R05 Cough: Secondary | ICD-10-CM | POA: Diagnosis not present

## 2018-05-20 DIAGNOSIS — D649 Anemia, unspecified: Secondary | ICD-10-CM | POA: Diagnosis not present

## 2018-05-20 DIAGNOSIS — R509 Fever, unspecified: Secondary | ICD-10-CM | POA: Diagnosis not present

## 2018-05-20 DIAGNOSIS — J189 Pneumonia, unspecified organism: Secondary | ICD-10-CM | POA: Diagnosis not present

## 2018-05-20 LAB — BASIC METABOLIC PANEL
BUN: 35 — AB (ref 4–21)
Creatinine: 1.1 (ref 0.6–1.3)
GLUCOSE: 159
Potassium: 4.4 (ref 3.4–5.3)
SODIUM: 141 (ref 137–147)

## 2018-05-20 LAB — CBC AND DIFFERENTIAL
HEMATOCRIT: 33 — AB (ref 41–53)
Hemoglobin: 11.4 — AB (ref 13.5–17.5)
NEUTROS ABS: 7
PLATELETS: 230 (ref 150–399)
WBC: 9.1

## 2018-05-20 LAB — HEPATIC FUNCTION PANEL
ALT: 6 — AB (ref 10–40)
AST: 9 — AB (ref 14–40)
Alkaline Phosphatase: 92 (ref 25–125)
BILIRUBIN, TOTAL: 0.3

## 2018-05-23 ENCOUNTER — Encounter: Payer: Self-pay | Admitting: Adult Health

## 2018-05-23 ENCOUNTER — Non-Acute Institutional Stay (SKILLED_NURSING_FACILITY): Payer: PPO | Admitting: Adult Health

## 2018-05-23 DIAGNOSIS — R1310 Dysphagia, unspecified: Secondary | ICD-10-CM

## 2018-05-23 DIAGNOSIS — J690 Pneumonitis due to inhalation of food and vomit: Secondary | ICD-10-CM | POA: Diagnosis not present

## 2018-05-23 NOTE — Progress Notes (Signed)
Location:  Occupational psychologist of Service:  SNF (31) Provider:   Cindi Carbon, ANP [place] [phone]   [name], DO  Patient Care Team: [name], DO as PCP - General (Geriatric Medicine)  Extended Emergency Contact Information Primary Emergency Contact: Alley,Kelly Address: [address]          [place], [place] [address] [name] of [place] Phone: [phone] Relation: Daughter Secondary Emergency Contact: Olazabal,[name] Address: [address] [place] [address] [place] of [place] Phone: [phone] Relation: Son  Code Status:  DNR Goals of care: Advanced Directive information Advanced Directives 03/19/2018  Does Patient Have a Medical Advance Directive? Yes  Type of Paramedic of [place];Out of facility DNR (pink MOST or yellow form)  Does patient want to make changes to medical advance directive? No - Patient declined  Copy of [place] in Chart? Yes  Pre-existing out of facility DNR order (yellow form or pink MOST form) Yellow form placed in chart (order not valid for inpatient use)     Chief Complaint  Patient presents with  . Acute Visit    f/u pna    HPI:  Pt is a 77 y.o. male seen today for an acute visit for follow up regarding pna.  Resident had a fever and lethargy with a congested cough. A CXR was ordered which showed patchy infiltrates non consolidated present in both lower lobes . Augmentin started 11/17  The nurse reported that he continues with a congested cough and appeared to sleep more but it was difficult to assess lethargy due to his advanced frontal dementia.  He has a hx of dysphagia and is on a D3 diet with thin liquids. No reported issues chewing or swallowing. He is on tegretol and neurontin for behavioral issues associated with his dementia and the neurontin was decreased on 11/14 due to lethargy. Tegretol level  7.9 Past Medical History:  Diagnosis Date  . Dementia ([place])   . Diabetes ([place])   . Heart disease   . Hyperlipidemia   . Hypotension    Past Surgical History:  Procedure Laterality Date  . APPENDECTOMY  1961  . CORONARY ANGIOPLASTY WITH STENT PLACEMENT  2016  . TONSILLECTOMY AND ADENOIDECTOMY    . TOTAL KNEE ARTHROPLASTY  2012    Allergies  Allergen Reactions  . Lortab [Hydrocodone-Acetaminophen] Other (See Comments)    Reaction:  Affected pts cognition   . Morphine And Related Other (See Comments)    Reaction:  Affected pts cognition     Outpatient Encounter Medications as of 05/23/2018  Medication Sig  . acetaminophen (TYLENOL) 325 MG tablet Take 650 mg by mouth daily.  Marland Kitchen allopurinol (ZYLOPRIM) 100 MG tablet Take 1 tablet (100 mg total) by mouth daily.  . bisacodyl (DULCOLAX) 10 MG suppository Place 10 mg rectally daily as needed (constipation).   . carbamazepine (TEGRETOL) 200 MG tablet Take 200 mg by mouth every morning. 2 tablets (400 mg) at bedtime  . clopidogrel (PLAVIX) 75 MG tablet Take 1 tablet (75 mg total) by mouth daily.  Marland Kitchen gabapentin (NEURONTIN) 100 MG capsule Take 100 mg by mouth 2 (two) times daily.   Marland Kitchen GLUCERNA (GLUCERNA) LIQD Take 237 mLs by mouth daily.  . hydrOXYzine (VISTARIL) 25 MG capsule Take 25 mg by mouth at bedtime.  Marland Kitchen ipratropium-albuterol (DUONEB) 0.5-2.5 (3) MG/3ML SOLN Take 3 mLs by nebulization  3 (three) times daily as needed (shortness of breath/cough).   . nitroGLYCERIN (NITROSTAT) 0.4 MG SL tablet Place 0.4 mg under the tongue every 5 (five) minutes as needed for chest pain.  . polyethylene glycol (MIRALAX / GLYCOLAX) packet Take 17 g by mouth daily.  Marland Kitchen senna-docusate (SENOKOT-S) 8.6-50 MG tablet Take 1 tablet by mouth 2 (two) times daily.  . vitamin B-12 (CYANOCOBALAMIN) 1000 MCG tablet Take 1 tablet (1,000 mcg total) by mouth daily.   No facility-administered encounter medications on file as of 05/23/2018.     Review of Systems  Unable  to perform ROS: Dementia    Immunization History  Administered Date(s) Administered  . Influenza,inj,Quad PF,6+ Mos 04/23/2018  . Influenza-Unspecified 05/05/2010, 04/11/2013, 03/03/2015, 09/06/2016, 05/10/2017  . Pneumococcal Polysaccharide-23 12/25/2010  . Pneumococcal-Unspecified 02/28/2013  . Td 06/01/2017  . Tdap 03/22/2006  . Zoster 11/01/2012  . Zoster Recombinat (Shingrix) 05/25/2017, 09/25/2017   Pertinent  Health Maintenance Due  Topic Date Due  . HEMOGLOBIN A1C  12/22/2017  . OPHTHALMOLOGY EXAM  06/22/2018 (Originally 01/15/1951)  . FOOT EXAM  05/14/2019  . INFLUENZA VACCINE  Completed  . PNA vac Low Risk Adult  Completed  . COLONOSCOPY  Discontinued   Fall Risk  05/29/2017 04/05/2016  Falls in the past year? No Yes  Number falls in past yr: - 1  Injury with Fall? - Yes   Functional Status Survey:    Vitals:   05/23/18 1200  BP: (!) 91/57  Pulse: (!) 51  Resp: 17  Temp: 98.1 F (36.7 C)  SpO2: 94%   There is no height or weight on file to calculate BMI. Physical Exam  Constitutional: No distress.  HENT:  Head: Normocephalic and atraumatic.  Mouth/Throat: No oropharyngeal exudate.  Neck: No JVD present. No tracheal deviation present. No thyromegaly present.  Cardiovascular: Regular rhythm.  No murmur heard. Rate in the 50s  Pulmonary/Chest: Effort normal. No respiratory distress. He has no wheezes.  Scattered rhonchi  Abdominal: Soft. Bowel sounds are normal. He exhibits no distension. There is no tenderness.  Musculoskeletal: He exhibits no edema, tenderness or deformity.  Reduced ROM to BUE and BLE but resisted exam so it was difficult to assess  Lymphadenopathy:    He has no cervical adenopathy.  Neurological:  Resisted exam. Eyes closed but physically responds to my voice. Not able to f/c.    Skin: Skin is warm and dry. He is not diaphoretic.  Nursing note and vitals reviewed.   Labs reviewed: Recent Labs    06/23/17 05/20/18  NA 142 141   K 4.3 4.4  BUN 16 35*  CREATININE 0.9 1.1   Recent Labs    06/23/17 05/20/18  AST 12* 9*  ALT 16 6*  ALKPHOS 171* 92   Recent Labs    06/23/17 05/20/18  WBC 7.5 9.1  NEUTROABS  --  7  HGB 12.9* 11.4*  HCT 39* 33*  PLT 194 230   Lab Results  Component Value Date   TSH 0.77 10/31/2016   Lab Results  Component Value Date   HGBA1C 6.8 06/23/2017   Lab Results  Component Value Date   CHOL 124 10/26/2016   HDL 40 10/26/2016   LDLCALC 62 10/26/2016   TRIG 111 10/26/2016    Significant Diagnostic Results in last 30 days:  No results found.  Assessment/Plan  1. Aspiration pneumonia of both lower lobes due to regurgitated food ([place]) Continue Augmentin to complete 7 day course. If this occurs again will discuss  with his daughter about avoiding antibiotics due to his advanced dementia. In the past we had agreed to this but her brother filled out the most form indicating otherwise. Would not re xray but follow clinically.   2. Dysphagia, unspecified type He is fed 1:1 and upright for all meals on D3 diet. Monitor for diet tolerability and down grade as necessary.    Family/ staff Communication: discussed with nurse  Labs/tests ordered:  NA

## 2018-05-29 ENCOUNTER — Other Ambulatory Visit: Payer: Self-pay | Admitting: Internal Medicine

## 2018-05-29 ENCOUNTER — Non-Acute Institutional Stay (SKILLED_NURSING_FACILITY): Payer: PPO

## 2018-05-29 DIAGNOSIS — Z7189 Other specified counseling: Secondary | ICD-10-CM

## 2018-05-29 DIAGNOSIS — Z0000 Encounter for general adult medical examination without abnormal findings: Secondary | ICD-10-CM

## 2018-05-29 NOTE — Patient Instructions (Signed)
Mr. Jose Castillo , I have completed the annual wellness visit as per Medicare guidelines. The attached information is provided for the patient's family, care providers, and facility of residence.  Screening recommendations/referrals: Colonoscopy excluded, over age 78 Recommended yearly ophthalmology/optometry visit for glaucoma screening and checkup Recommended yearly dental visit for hygiene and checkup  Vaccinations: Influenza vaccine up to date Pneumococcal vaccine up to date, completed Tdap vaccine up to date, due 06/02/2027 Shingles vaccine up to date, completed    Advanced directives: in chart  Conditions/risks identified: Fall Risk  Next appointment: Dr. Mariea Clonts makes rounds  Preventive Care 20 Years and Older, Male Preventive care refers to lifestyle choices and visits with your health care provider that can promote health and wellness. What does preventive care include?  A yearly physical exam. This is also called an annual well check.  Dental exams once or twice a year.  Routine eye exams. Ask your health care provider how often you should have your eyes checked.  Personal lifestyle choices, including:  Daily care of your teeth and gums.  Regular physical activity.  Eating a healthy diet.  Avoiding tobacco and drug use.  Limiting alcohol use.  Taking vitamin and mineral supplements as recommended by your health care provider. What happens during an annual well check? The services and screenings done by your health care provider during your annual well check will depend on your age, overall health, lifestyle risk factors, and family history of disease. Counseling  Your health care provider may ask you questions about your:  Alcohol use.  Tobacco use.  Drug use.  Emotional well-being.  Home and relationship well-being.  Sexual activity.  Eating habits.  History of falls.  Memory and ability to understand (cognition).  Work and work  Statistician. Screening  You may have the following tests or measurements:  Height, weight, and BMI.  Blood pressure.  Lipid and cholesterol levels. These may be checked every 5 years, or more frequently if you are over 61 years old.  Skin check.  Lung cancer screening. You may have this screening every year starting at age 29 if you have a 30-pack-year history of smoking and currently smoke or have quit within the past 15 years.  Fecal occult blood test (FOBT) of the stool. You may have this test every year starting at age 78.  Flexible sigmoidoscopy or colonoscopy. You may have a sigmoidoscopy every 5 years or a colonoscopy every 10 years starting at age 32.  Prostate cancer screening. Recommendations will vary depending on your family history and other risks.  Hepatitis C blood test.  Hepatitis B blood test.  Diabetes screening. This is done by checking your blood sugar (glucose) after you have not eaten for a while (fasting). You may have this done every 1-3 years.  Abdominal aortic aneurysm (AAA) screening. You may need this if you are a current or former smoker.  Osteoporosis. You may be screened starting at age 30 if you are at high risk. Talk with your health care provider about your test results, treatment options, and if necessary, the need for more tests. Vaccines  Your health care provider may recommend certain vaccines, such as:  Influenza vaccine. This is recommended every year.  Tetanus, diphtheria, and acellular pertussis (Tdap, Td) vaccine. You may need a Td booster every 10 years.  Zoster vaccine. You may need this after age 49.  Pneumococcal 13-valent conjugate (PCV13) vaccine. One dose is recommended after age 62.  Pneumococcal polysaccharide (PPSV23) vaccine. One dose is  recommended after age 24. Talk to your health care provider about which screenings and vaccines you need and how often you need them. This information is not intended to replace advice  given to you by your health care provider. Make sure you discuss any questions you have with your health care provider. Document Released: 07/16/2015 Document Revised: 03/08/2016 Document Reviewed: 04/20/2015 Elsevier Interactive Patient Education  2017 [place] can cause injuries. They can happen to people of all ages. There are many things you can do to make your home safe and to help prevent falls.  What can I do in the bathroom?  Use night lights.  Install grab bars by the toilet and in the tub and shower. Do not use towel bars as grab bars.  Use non-skid mats or decals in the tub or shower.  If you need to sit down in the shower, use a plastic, non-slip stool.  Keep the floor dry. Clean up any water that spills on the floor as soon as it happens.  Remove soap buildup in the tub or shower regularly.  Attach bath mats securely with double-sided non-slip rug tape.  Do not have throw rugs and other things on the floor that can make you trip. What can I do in the bedroom?  Use night lights.  Make sure that you have a light by your bed that is easy to reach.  Do not use any sheets or blankets that are too big for your bed. They should not hang down onto the floor.  Have a firm chair that has side arms. You can use this for support while you get dressed.  Do not have throw rugs and other things on the floor that can make you trip. What else can I do to help prevent falls?  Wear shoes that:  Do not have high heels.  Have rubber bottoms.  Are comfortable and fit you well.  Are closed at the toe. Do not wear sandals.  If you use a stepladder:  Make sure that it is fully opened. Do not climb a closed stepladder.  Make sure that both sides of the stepladder are locked into place.  Ask someone to hold it for you, if possible.  Clearly mark and make sure that you can see:  Any grab bars or handrails.  First and last steps.  Where the  edge of each step is.  Use tools that help you move around (mobility aids) if they are needed. These include:  Canes.  Walkers.  Scooters.  Crutches.  Turn on the lights when you go into a dark area. Replace any light bulbs as soon as they burn out.  Set up your furniture so you have a clear path. Avoid moving your furniture around.  If any of your floors are uneven, fix them.  Review your medicines with your doctor. Some medicines can make you feel dizzy. This can increase your chance of falling. Ask your doctor what other things that you can do to help prevent falls. This information is not intended to replace advice given to you by your health care provider. Make sure you discuss any questions you have with your health care provider. Document Released: 04/15/2009 Document Revised: 11/25/2015 Document Reviewed: 07/24/2014 Elsevier Interactive Patient Education  2017 [employer].

## 2018-05-29 NOTE — Progress Notes (Signed)
Subjective:   Jose Castillo is a 77 y.o. male who presents for Medicare Annual/Subsequent preventive examination at [facility]; incapacitated patient unable to answer questions appropriately. History, medication list, and ADL's verified by medical record/family.     Objective:    Vitals: BP 118/60 (BP Location: Left Arm, Patient Position: Sitting)   Pulse 60   Temp 98 F (36.7 C) (Oral)   Ht 5\' 5"  (1.651 m)   Wt 160 lb (72.6 kg)   BMI 26.63 kg/m   Body mass index is 26.63 kg/m.  Advanced Directives 05/29/2018 03/19/2018 12/11/2017 10/09/2017 05/29/2017 05/01/2017 03/15/2017  Does Patient Have a Medical Advance Directive? Yes Yes Yes Yes No Yes Yes  Type of Paramedic of [place];Out of facility DNR (pink MOST or yellow form) [place];Out of facility DNR (pink MOST or yellow form) [place];Living will;Out of facility DNR (pink MOST or yellow form) [place];Out of facility DNR (pink MOST or yellow form) Out of facility DNR (pink MOST or yellow form);Healthcare Power of [employer] of facility DNR (pink MOST or yellow form);Healthcare Power of [employer] of facility DNR (pink MOST or yellow form);[place];Living will  Does patient want to make changes to medical advance directive? No - Patient declined No - Patient declined No - Patient declined No - Patient declined - - -  Copy of [place] in Chart? Yes - validated most recent copy scanned in chart (See row information) Yes Yes Yes Yes Yes Yes  Pre-existing out of facility DNR order (yellow form or pink MOST form) Yellow form placed in chart (order not valid for inpatient use) Yellow form placed in chart (order not valid for inpatient use) Yellow form placed in chart (order not valid for inpatient use) Yellow form placed in chart (order not valid for inpatient use) Yellow form placed in chart (order not  valid for inpatient use);Pink MOST form placed in chart (order not valid for inpatient use) Yellow form placed in chart (order not valid for inpatient use) Yellow form placed in chart (order not valid for inpatient use)    Tobacco Social History   Tobacco Use  Smoking Status Former Smoker  . Packs/day: 0.50  . Years: 20.00  . Pack years: 10.00  . Types: Cigarettes  . Last attempt to quit: 2003  . Years since quitting: 16.9  Smokeless Tobacco Never Used     Counseling given: Not Answered   Clinical Intake:  Pre-visit preparation completed: No  Pain : 0-10 Pain Score: 0-No pain     Diabetes: Yes CBG done?: No Did pt. bring in CBG monitor from home?: No  How often do you need to have someone help you when you read instructions, pamphlets, or other written materials from your doctor or pharmacy?: 5 - Always  Interpreter Needed?: No  Information entered by :: Tyson Dense, RN  Past Medical History:  Diagnosis Date  . Dementia ([place])   . Diabetes ([place])   . Heart disease   . Hyperlipidemia   . Hypotension    Past Surgical History:  Procedure Laterality Date  . APPENDECTOMY  1961  . CORONARY ANGIOPLASTY WITH STENT PLACEMENT  2016  . TONSILLECTOMY AND ADENOIDECTOMY    . TOTAL KNEE ARTHROPLASTY  2012   Family History  Problem Relation Age of Onset  . Cancer Mother 76       Lymphoma  . Heart attack Father 73  .  Diabetes Sister   . Brain cancer Sister    Social History   Socioeconomic History  . Marital status: Widowed    Spouse name: Not on file  . Number of children: 3  . Years of education: MBA  . Highest education level: Not on file  Occupational History  . Occupation: N/A  Social Needs  . Financial resource strain: Not on file  . Food insecurity:    Worry: Not on file    Inability: Not on file  . Transportation needs:    Medical: Not on file    Non-medical: Not on file  Tobacco Use  . Smoking status: Former Smoker    Packs/day: 0.50    Years:  20.00    Pack years: 10.00    Types: Cigarettes    Last attempt to quit: 2003    Years since quitting: 16.9  . Smokeless tobacco: Never Used  Substance and Sexual Activity  . Alcohol use: No    Comment: Quit 2013  . Drug use: No  . Sexual activity: Never  Lifestyle  . Physical activity:    Days per week: Not on file    Minutes per session: Not on file  . Stress: Not on file  Relationships  . Social connections:    Talks on phone: Not on file    Gets together: Not on file    Attends religious service: Not on file    Active member of club or organization: Not on file    Attends meetings of clubs or organizations: Not on file    Relationship status: Not on file  Other Topics Concern  . Not on file  Social History Narrative   Right-handed      Do you drink/eat things with caffeine? occasional diet Coke      Marital status?  widowed                        What year were you married?  1966      Do you live in a house, apartment, assisted living, condo, trailer, etc.? [place]      Is it one or more stories? one      How many persons live in your home?      Do you have any pets in your home? (please list) none      Current or past profession: financial      Do you exercise?   yes                                   Type & how often? 1/day      Do you have a living will? yes      Do you have a DNR form?                                  If not, do you want to discuss one?      Do you have signed POA/HPOA for forms?     Outpatient Encounter Medications as of 05/29/2018  Medication Sig  . acetaminophen (TYLENOL) 325 MG tablet Take 650 mg by mouth daily.  [facility] Kitchen allopurinol (ZYLOPRIM) 100 MG tablet Take 1 tablet (100 mg total) by mouth daily.  . bisacodyl (DULCOLAX) 10 MG suppository Place 10 mg rectally daily as needed (constipation).   Mar[facility] Kitchen  carbamazepine (TEGRETOL) 200 MG tablet Take 200 mg by mouth every morning. 2 tablets (400 mg) at bedtime  . clopidogrel  (PLAVIX) 75 MG tablet Take 1 tablet (75 mg total) by mouth daily.  Marland Kitchen gabapentin (NEURONTIN) 100 MG capsule Take 100 mg by mouth 2 (two) times daily.   Mar[facility] Kitchen GLUCERNA (GLUCERNA) LIQD Take 237 mLs by mouth daily.  . hydrOXYzine (VISTARIL) 25 MG capsule Take 25 mg by mouth at bedtime.  Marland Kitchen ipratropium-albuterol (DUONEB) 0.5-2.5 (3) MG/3ML SOLN Take 3 mLs by nebulization 3 (three) times daily as needed (shortness of breath/cough).   . nitroGLYCERIN (NITROSTAT) 0.4 MG SL tablet Place 0.4 mg under the tongue every 5 (five) minutes as needed for chest pain.  . polyethylene glycol (MIRALAX / GLYCOLAX) packet Take 17 g by mouth daily.  Marland Kitchen senna-docusate (SENOKOT-S) 8.6-50 MG tablet Take 1 tablet by mouth 2 (two) times daily.  . vitamin B-12 (CYANOCOBALAMIN) 1000 MCG tablet Take 1 tablet (1,000 mcg total) by mouth daily.   No facility-administered encounter medications on file as of 05/29/2018.     Activities of Daily Living In your present state of health, do you have any difficulty performing the following activities: 05/29/2018 05/29/2017  Hearing? (No Data) N  Comment unable to ask -  Vision? (No Data) Y  Comment unale to ask diabetic retinopathy  Difficulty concentrating or making decisions? Tempie Donning  Walking or climbing stairs? Y Y  Dressing or bathing? Y Y  Doing errands, shopping? Tempie Donning  Preparing Food and eating ? Y Y  Using the Toilet? Y Y  In the past six months, have you accidently leaked urine? Y Y  Do you have problems with loss of bowel control? Y Y  Managing your Medications? Y Y  Managing your Finances? Tempie Donning  Housekeeping or managing your Housekeeping? Tempie Donning  Some recent data might be hidden    Patient Care Team: [name], DO as PCP - General (Geriatric Medicine)   Assessment:   This is a routine wellness examination for Jose Castillo.  Exercise Activities and Dietary recommendations Current Exercise Habits: The patient does not participate in regular exercise at present, Exercise  limited by: orthopedic condition(s)  Goals   None     Fall Risk Fall Risk  05/29/2018 05/29/2017 04/05/2016  Falls in the past year? 0 No Yes  Number falls in past yr: 0 - 1  Injury with Fall? 0 - Yes   Is the patient's home free of loose throw rugs in walkways, pet beds, electrical cords, etc?   yes      Grab bars in the bathroom? yes      Handrails on the stairs?   yes      Adequate lighting?   yes  Depression Screen PHQ 2/9 Scores 05/29/2018 05/29/2017 04/05/2016  PHQ - 2 Score 0 - 0  Exception Documentation - Medical reason -    Cognitive Function MMSE - Mini Mental State Exam 05/29/2018 05/29/2017 08/30/2016  Not completed: Unable to complete Unable to complete -  Orientation to time - - 3  Orientation to Place - - 3  Registration - - 3  Attention/ Calculation - - 1  Recall - - 1  Language- name 2 objects - - 2  Language- repeat - - 1  Language- follow 3 step command - - 3  Language- read & follow direction - - 1  Write a sentence - - 1  Copy design - - 0  Total score - -  19        Immunization History  Administered Date(s) Administered  . Influenza,inj,Quad PF,6+ Mos 04/23/2018  . Influenza-Unspecified 05/05/2010, 04/11/2013, 03/03/2015, 09/06/2016, 05/10/2017  . Pneumococcal Polysaccharide-23 12/25/2010  . Pneumococcal-Unspecified 02/28/2013  . Td 06/01/2017  . Tdap 03/22/2006  . Zoster 11/01/2012  . Zoster Recombinat (Shingrix) 05/25/2017, 09/25/2017    Qualifies for Shingles Vaccine? Up to date, completed  Screening Tests Health Maintenance  Topic Date Due  . HEMOGLOBIN A1C  12/22/2017  . OPHTHALMOLOGY EXAM  06/22/2018 (Originally 01/15/1951)  . FOOT EXAM  05/14/2019  . TETANUS/TDAP  06/02/2027  . INFLUENZA VACCINE  Completed  . PNA vac Low Risk Adult  Completed  . COLONOSCOPY  Discontinued   Cancer Screenings: Lung: Low Dose CT Chest recommended if Age 29-80 years, 30 pack-year currently smoking OR have quit w/in 15years. Patient does not  qualify. Colorectal: up to date  Additional Screenings:  Hepatitis C Screening: declined Diabetic eye exam due: declined by PCP    Plan:    I have personally reviewed and addressed the Medicare Annual Wellness questionnaire and have noted the following in the patient's chart:  A. Medical and social history B. Use of alcohol, tobacco or illicit drugs  C. Current medications and supplements D. Functional ability and status E.  Nutritional status F.  Physical activity G. Advance directives H. List of other physicians I.  Hospitalizations, surgeries, and ER visits in previous 12 months J.  [place] to include hearing, vision, cognitive, depression L. Referrals and appointments - none  In addition, I am unable to review and discuss with incapacitated patient certain preventive protocols, quality metrics, and best practice recommendations. A written personalized care plan for preventive services as well as general preventive health recommendations were provided to facility of residence.   See attached scanned questionnaire for additional information.   Signed,   Tyson Dense, RN Nurse Health Advisor

## 2018-05-29 NOTE — Progress Notes (Signed)
DNR reentered after he had an ED visit and it was removed and never put back.

## 2018-06-11 ENCOUNTER — Encounter: Payer: Self-pay | Admitting: Internal Medicine

## 2018-06-11 ENCOUNTER — Non-Acute Institutional Stay (SKILLED_NURSING_FACILITY): Payer: PPO | Admitting: Internal Medicine

## 2018-06-11 DIAGNOSIS — E790 Hyperuricemia without signs of inflammatory arthritis and tophaceous disease: Secondary | ICD-10-CM | POA: Diagnosis not present

## 2018-06-11 DIAGNOSIS — G3109 Other frontotemporal dementia: Secondary | ICD-10-CM

## 2018-06-11 DIAGNOSIS — E1149 Type 2 diabetes mellitus with other diabetic neurological complication: Secondary | ICD-10-CM | POA: Diagnosis not present

## 2018-06-11 DIAGNOSIS — K5901 Slow transit constipation: Secondary | ICD-10-CM | POA: Diagnosis not present

## 2018-06-11 DIAGNOSIS — R1310 Dysphagia, unspecified: Secondary | ICD-10-CM

## 2018-06-11 DIAGNOSIS — F0280 Dementia in other diseases classified elsewhere without behavioral disturbance: Secondary | ICD-10-CM

## 2018-06-11 NOTE — Progress Notes (Signed)
Patient ID: Jose Castillo, male   DOB: 1941-04-12, 77 y.o.   MRN: [id number]  Location:  [facility] Room Number: 962 Place of Service:  SNF [phone]) Provider:    [name], DO  Patient Care Team: [name], DO as PCP - General (Geriatric Medicine)  Extended Emergency Contact Information Primary Emergency Contact: Alley,Kelly Address: [address]          [place], [place] [address] [name] of [place] Phone: [phone] Relation: Daughter Secondary Emergency Contact: Iturralde,[name] Address: [address] [place] [address] [place] of [place] Phone: [phone] Relation: Son  Code Status:  DNR, MOST Goals of care: Advanced Directive information Advanced Directives 06/11/2018  Does Patient Have a Medical Advance Directive? Yes  Type of Paramedic of [place];Out of facility DNR (pink MOST or yellow form)  Does patient want to make changes to medical advance directive? No - Patient declined  Copy of [place] in Chart? Yes - validated most recent copy scanned in chart (See row information)  Pre-existing out of facility DNR order (yellow form or pink MOST form) Yellow form placed in chart (order not valid for inpatient use);Pink MOST form placed in chart (order not valid for inpatient use)     Chief Complaint  Patient presents with  . Medical Management of Chronic Issues    Routine Visit    HPI:  Pt is a 77 y.o. male seen today for medical management of chronic diseases.  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  He has had considerable difficulty with behaviors when in memory care where he removed clothes and urinated in inappropriate places.  He currently spends much of his day sleeping to where he sometimes missed meds b/c he's not able to be aroused.  Late in the afternoon and evening, he  wakes up more and attempts to rock his wheelchair and groans aloud, seems to be trying to get up (but he cannot safely ambulate).  Recently, she's had some increased cough and congestion and has gotten duonebs.  He does not grimace or show other signs of pain.  He moves his bowels with his regular constipation regimen.  His goals of care are mostly comfort based thought there's been some disagreement b/w his children about his MOST form checkboxes.  He's had some cough and benefiting from duonebs, but has been afebrile and sats remain mid-90s.    Past Medical History:  Diagnosis Date  . Dementia ([place])   . Diabetes ([place])   . Heart disease   . Hyperlipidemia   . Hypotension    Past Surgical History:  Procedure Laterality Date  . APPENDECTOMY  1961  . CORONARY ANGIOPLASTY WITH STENT PLACEMENT  2016  . TONSILLECTOMY AND ADENOIDECTOMY    . TOTAL KNEE ARTHROPLASTY  2012    Allergies  Allergen Reactions  . Lortab [Hydrocodone-Acetaminophen] Other (See Comments)    Reaction:  Affected pts cognition   . Morphine And Related Other (See Comments)    Reaction:  Affected pts cognition     Outpatient Encounter Medications as of 06/11/2018  Medication Sig  . acetaminophen (TYLENOL) 325 MG tablet Take 650 mg by mouth daily.  Mar[facility] Kitchen allopurinol (ZYLOPRIM) 100 MG tablet Take 1 tablet (100 mg total) by mouth daily.  . bisacodyl (DULCOLAX) 10 MG suppository Place 10 mg rectally daily  as needed (constipation).   . carbamazepine (TEGRETOL) 200 MG tablet Take 200 mg by mouth every morning. 2 tablets (400 mg) at bedtime  . clopidogrel (PLAVIX) 75 MG tablet Take 1 tablet (75 mg total) by mouth daily.  Mar[facility] Kitchen gabapentin (NEURONTIN) 100 MG capsule Take 100 mg by mouth 2 (two) times daily.   Mar[facility] Kitchen GLUCERNA (GLUCERNA) LIQD Take 237 mLs by mouth daily.  . hydrOXYzine (VISTARIL) 25 MG capsule Take 25 mg by mouth at bedtime.  Marland Kitchen ipratropium-albuterol (DUONEB) 0.5-2.5 (3) MG/3ML SOLN Take 3 mLs by nebulization 3 (three) times  daily as needed (shortness of breath/cough).   . nitroGLYCERIN (NITROSTAT) 0.4 MG SL tablet Place 0.4 mg under the tongue every 5 (five) minutes as needed for chest pain.  . polyethylene glycol (MIRALAX / GLYCOLAX) packet Take 17 g by mouth daily.  Marland Kitchen senna-docusate (SENOKOT-S) 8.6-50 MG tablet Take 1 tablet by mouth 2 (two) times daily.  . vitamin B-12 (CYANOCOBALAMIN) 1000 MCG tablet Take 1 tablet (1,000 mcg total) by mouth daily.   No facility-administered encounter medications on file as of 06/11/2018.     Review of Systems  Constitutional: Negative for activity change, appetite change, chills and fever.  HENT: Positive for congestion and trouble swallowing.   Eyes: Negative for visual disturbance.  Respiratory: Positive for cough. Negative for choking, chest tightness and shortness of breath.   Gastrointestinal: Positive for constipation. Negative for abdominal pain, diarrhea, nausea and vomiting.  Genitourinary: Negative for dysuria.  Musculoskeletal: Positive for gait problem. Negative for arthralgias and back pain.  Skin: Negative for color change.    Immunization History  Administered Date(s) Administered  . Influenza,inj,Quad PF,6+ Mos 04/23/2018  . Influenza-Unspecified 05/05/2010, 04/11/2013, 03/03/2015, 09/06/2016, 05/10/2017  . Pneumococcal Polysaccharide-23 12/25/2010  . Pneumococcal-Unspecified 02/28/2013  . Td 06/01/2017  . Tdap 03/22/2006  . Zoster 11/01/2012  . Zoster Recombinat (Shingrix) 05/25/2017, 09/25/2017   Pertinent  Health Maintenance Due  Topic Date Due  . HEMOGLOBIN A1C  12/22/2017  . OPHTHALMOLOGY EXAM  06/22/2018 (Originally 01/15/1951)  . FOOT EXAM  05/14/2019  . INFLUENZA VACCINE  Completed  . PNA vac Low Risk Adult  Completed  . COLONOSCOPY  Discontinued   Fall Risk  05/29/2018 05/29/2017 04/05/2016  Falls in the past year? 0 No Yes  Number falls in past yr: 0 - 1  Injury with Fall? 0 - Yes   Functional Status Survey:    Vitals:    06/11/18 1439  BP: 99/62  Pulse: 67  Resp: 18  Temp: 98.5 F (36.9 C)  TempSrc: Oral  SpO2: 96%  Weight: 161 lb (73 kg)  Height: 5\' 5"  (1.651 m)   Body mass index is 26.79 kg/m. Physical Exam Constitutional:      General: He is not in acute distress.    Appearance: He is normal weight. He is not toxic-appearing.     Comments: Snoring, but does arouse briefly, opens eyes, grabs at his feet, nonverbal, has specialty wheelchair he's sitting in  HENT:     Head: Normocephalic and atraumatic.  Cardiovascular:     Rate and Rhythm: Normal rate and regular rhythm.     Pulses: Normal pulses.     Heart sounds: Normal heart sounds.  Pulmonary:     Effort: Pulmonary effort is normal.     Breath sounds: Normal breath sounds.     Comments: Some coarse rhonchi in upper airways Abdominal:     General: Bowel sounds are normal. There is no distension.  Palpations: Abdomen is soft.  Musculoskeletal: Normal range of motion.  Skin:    General: Skin is warm and dry.     Capillary Refill: Capillary refill takes less than 2 seconds.  Neurological:     General: No focal deficit present.     Mental Status: Mental status is at baseline.  Psychiatric:     Comments: No change     Labs reviewed: Recent Labs    06/23/17 05/17/18 0530 05/20/18  NA 142 140 141  K 4.3 4.6 4.4  BUN 16 23* 35*  CREATININE 0.9 0.9 1.1   Recent Labs    06/23/17 05/17/18 0530 05/20/18  AST 12* 11* 9*  ALT 16 11 6*  ALKPHOS 171* 106 92   Recent Labs    06/23/17 05/20/18  WBC 7.5 9.1  NEUTROABS  --  7  HGB 12.9* 11.4*  HCT 39* 33*  PLT 194 230   Lab Results  Component Value Date   TSH 0.77 10/31/2016   Lab Results  Component Value Date   HGBA1C 6.8 06/23/2017   Lab Results  Component Value Date   CHOL 124 10/26/2016   HDL 40 10/26/2016   LDLCALC 62 10/26/2016   TRIG 111 10/26/2016    Assessment/Plan 1. 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 Type 2 diabetes mellitus with neurological complications ([facility]) -well controlled Lab Results  Component Value Date   HGBA1C 6.8 06/23/2017  -cont gabapentin for neuropathic pain  3. Dysphagia, unspecified type -ongoing, cont aspiration precautions, cont modified diet:  Mechanical soft all foods chopped, thin liquids. See [facility] book for comp. strategies supervision. Food in separate bowls. -cont glucerna supplement  4. Slow transit constipation -controlled with current bowel regimen, cont same and monitor  5. Hyperuricemia -continues allopurinol  Family/ staff Communication: discussed with snf nurse  Labs/tests ordered:  No new  Donny Heffern L. Sheryle Vice, D.O. [facility] [address], [place] [address] Cell Phone (Mon-Fri 8am-5pm):  [phone] On Call:  [phone] & follow prompts after 5pm & weekends Office Phone:  [phone] Office Fax:  [phone]

## 2018-07-04 ENCOUNTER — Non-Acute Institutional Stay (SKILLED_NURSING_FACILITY): Payer: PPO | Admitting: Adult Health

## 2018-07-04 DIAGNOSIS — E790 Hyperuricemia without signs of inflammatory arthritis and tophaceous disease: Secondary | ICD-10-CM

## 2018-07-04 DIAGNOSIS — G6289 Other specified polyneuropathies: Secondary | ICD-10-CM

## 2018-07-04 DIAGNOSIS — F0280 Dementia in other diseases classified elsewhere without behavioral disturbance: Secondary | ICD-10-CM

## 2018-07-04 DIAGNOSIS — I119 Hypertensive heart disease without heart failure: Secondary | ICD-10-CM

## 2018-07-04 DIAGNOSIS — E119 Type 2 diabetes mellitus without complications: Secondary | ICD-10-CM | POA: Diagnosis not present

## 2018-07-04 DIAGNOSIS — G3109 Other frontotemporal dementia: Secondary | ICD-10-CM

## 2018-07-04 DIAGNOSIS — K5901 Slow transit constipation: Secondary | ICD-10-CM | POA: Diagnosis not present

## 2018-07-05 ENCOUNTER — Encounter: Payer: Self-pay | Admitting: Adult Health

## 2018-07-05 NOTE — Progress Notes (Signed)
Location:  [facility] of Service:  SNF (31) Provider:   Cindi Carbon, ANP [place] [phone]   [name], DO  Patient Care Team: [name], DO as PCP - General (Geriatric Medicine)  Extended Emergency Contact Information Primary Emergency Contact: Alley,Kelly Address: [address]          [place], [place] [address] [name] of [place] Phone: [phone] Relation: Daughter Secondary Emergency Contact: Grout,[name] Address: [address] [place] [address] [place] of [place] Phone: [phone] Relation: Son  Code Status:  DNR Goals of care: Advanced Directive information Advanced Directives 06/11/2018  Does Patient Have a Medical Advance Directive? Yes  Type of Paramedic of [place];Out of facility DNR (pink MOST or yellow form)  Does patient want to make changes to medical advance directive? No - Patient declined  Copy of [place] in Chart? Yes - validated most recent copy scanned in chart (See row information)  Pre-existing out of facility DNR order (yellow form or pink MOST form) Yellow form placed in chart (order not valid for inpatient use);Pink MOST form placed in chart (order not valid for inpatient use)     Chief Complaint  Patient presents with  . Medical Management of Chronic Issues    HPI:  Pt is a 78 y.o. male seen today for medical management of chronic diseases.    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  Continues with periods of resistance during personal care. Appeared more sedated and therefore meds were adjusted as above. He continues to spend most of the day sleeping with his mouth open in a supportive chair. He has periods of  alertness and tends to get restless easily.  Hx of CAD with stent on plavix  BP controlled Off statin due to lack of benefit.   Hyperuricemia: no episodes of gout  Constipation: regular BMs with miralax   Functional status: hoyer lift, incontinent Past Medical History:  Diagnosis Date  . Dementia ([place])   . Diabetes ([place])   . Heart disease   . Hyperlipidemia   . Hypotension    Past Surgical History:  Procedure Laterality Date  . APPENDECTOMY  1961  . CORONARY ANGIOPLASTY WITH STENT PLACEMENT  2016  . TONSILLECTOMY AND ADENOIDECTOMY    . TOTAL KNEE ARTHROPLASTY  2012    Allergies  Allergen Reactions  . Lortab [Hydrocodone-Acetaminophen] Other (See Comments)    Reaction:  Affected pts cognition   . Morphine And Related Other (See Comments)    Reaction:  Affected pts cognition     Outpatient Encounter Medications as of 07/04/2018  Medication Sig  . allopurinol (ZYLOPRIM) 100 MG tablet Take 1 tablet (100 mg total) by mouth daily.  . bisacodyl (DULCOLAX) 10 MG suppository Place 10 mg rectally daily as needed (constipation).   . carbamazepine (TEGRETOL) 200 MG tablet Take 200 mg by mouth every morning. 2 tablets (400 mg) at bedtime  . clopidogrel (PLAVIX) 75 MG tablet Take 1 tablet (75 mg total) by mouth daily.  Marland Kitchen gabapentin (NEURONTIN) 100 MG capsule Take 100 mg by mouth 2 (two) times daily.   Marland Kitchen GLUCERNA (GLUCERNA) LIQD Take 237 mLs by mouth  daily.  . hydrOXYzine (VISTARIL) 25 MG capsule Take 25 mg by mouth at bedtime.  Marland Kitchen ipratropium-albuterol (DUONEB) 0.5-2.5 (3) MG/3ML SOLN Take 3 mLs by nebulization 3 (three) times daily as needed (shortness of breath/cough).   . nitroGLYCERIN (NITROSTAT) 0.4 MG SL tablet Place 0.4 mg under the tongue every 5 (five) minutes as needed for chest pain.  . polyethylene glycol (MIRALAX / GLYCOLAX) packet Take 17 g by mouth daily.  Marland Kitchen senna-docusate (SENOKOT-S) 8.6-50 MG tablet Take 1 tablet by mouth 2 (two) times daily.  . [DISCONTINUED]  acetaminophen (TYLENOL) 325 MG tablet Take 650 mg by mouth daily.  . [DISCONTINUED] vitamin B-12 (CYANOCOBALAMIN) 1000 MCG tablet Take 1 tablet (1,000 mcg total) by mouth daily.   No facility-administered encounter medications on file as of 07/04/2018.     Review of Systems  Unable to perform ROS: Dementia    Immunization History  Administered Date(s) Administered  . Influenza,inj,Quad PF,6+ Mos 04/23/2018  . Influenza-Unspecified 05/05/2010, 04/11/2013, 03/03/2015, 09/06/2016, 05/10/2017  . Pneumococcal Polysaccharide-23 12/25/2010  . Pneumococcal-Unspecified 02/28/2013  . Td 06/01/2017  . Tdap 03/22/2006  . Zoster 11/01/2012  . Zoster Recombinat (Shingrix) 05/25/2017, 09/25/2017   Pertinent  Health Maintenance Due  Topic Date Due  . HEMOGLOBIN A1C  12/22/2017  . OPHTHALMOLOGY EXAM  07/05/2018 (Originally 01/15/1951)  . FOOT EXAM  05/14/2019  . INFLUENZA VACCINE  Completed  . PNA vac Low Risk Adult  Completed  . COLONOSCOPY  Discontinued   Fall Risk  05/29/2018 05/29/2017 04/05/2016  Falls in the past year? 0 No Yes  Number falls in past yr: 0 - 1  Injury with Fall? 0 - Yes   Functional Status Survey:    Vitals:   07/05/18 1338  Weight: 159 lb 12.8 oz (72.5 kg)   Body mass index is 26.59 kg/m. Physical Exam Vitals signs and nursing note reviewed.  Constitutional:      General: He is not in acute distress.    Appearance: He is not diaphoretic.     Comments: Eyes closed   HENT:     Head: Normocephalic and atraumatic.  Neck:     Thyroid: No thyromegaly.     Vascular: No JVD.     Trachea: No tracheal deviation.  Cardiovascular:     Rate and Rhythm: Normal rate and regular rhythm.     Heart sounds: No murmur.  Pulmonary:     Effort: Pulmonary effort is normal. No respiratory distress.     Breath sounds: Normal breath sounds. No wheezing.  Abdominal:     General: Bowel sounds are normal. There is no distension.     Palpations: Abdomen is soft.     Tenderness:  There is no abdominal tenderness.  Lymphadenopathy:     Cervical: No cervical adenopathy.  Skin:    General: Skin is warm and dry.  Neurological:     Mental Status: Mental status is at baseline.     Comments: Rigidity and resistance noted to BUE and BLE. Eyes closed but arouses with verbal stim. Not verbal and not able to f/c.     Labs reviewed: Recent Labs    05/17/18 0530 05/20/18  NA 140 141  K 4.6 4.4  BUN 23* 35*  CREATININE 0.9 1.1   Recent Labs    05/17/18 0530 05/20/18  AST 11* 9*  ALT 11 6*  ALKPHOS 106 92   Recent Labs    05/20/18  WBC 9.1  NEUTROABS 7  HGB 11.4*  HCT 33*  PLT  230   Lab Results  Component Value Date   TSH 0.77 10/31/2016   Lab Results  Component Value Date   HGBA1C 6.8 06/23/2017   Lab Results  Component Value Date   CHOL 124 10/26/2016   HDL 40 10/26/2016   LDLCALC 62 10/26/2016   TRIG 111 10/26/2016    Significant Diagnostic Results in last 30 days:  No results found.  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 Followed by Dr. Casimiro Needle. Continue current regimen. Likely his periods of sleeping and keeping his eyes closed are related to progression of disease.   2. Other polyneuropathy Continue neurontin 100 mg BID  3. Slow transit constipation Continue miralax 17 grams qd  4. Hyperuricemia Continue allopurinol 100 mg qd  5. Diabetes mellitus type 2 without retinopathy ([place]) Controlled without meds  6. Benign hypertensive heart disease without heart failure Continue Plavix 75 mg qd    Family/ staff Communication: discussed w/staff  Labs/tests ordered:  Will check A1C with next labs due (just had labs done in Nov so will avoid another stick at this time)

## 2018-08-05 ENCOUNTER — Non-Acute Institutional Stay (SKILLED_NURSING_FACILITY): Payer: PPO | Admitting: Adult Health

## 2018-08-05 ENCOUNTER — Encounter: Payer: Self-pay | Admitting: Adult Health

## 2018-08-05 DIAGNOSIS — K5901 Slow transit constipation: Secondary | ICD-10-CM | POA: Diagnosis not present

## 2018-08-05 DIAGNOSIS — G6289 Other specified polyneuropathies: Secondary | ICD-10-CM

## 2018-08-05 DIAGNOSIS — E790 Hyperuricemia without signs of inflammatory arthritis and tophaceous disease: Secondary | ICD-10-CM | POA: Diagnosis not present

## 2018-08-05 DIAGNOSIS — F0280 Dementia in other diseases classified elsewhere without behavioral disturbance: Secondary | ICD-10-CM | POA: Diagnosis not present

## 2018-08-05 DIAGNOSIS — G3109 Other frontotemporal dementia: Secondary | ICD-10-CM

## 2018-08-05 DIAGNOSIS — M170 Bilateral primary osteoarthritis of knee: Secondary | ICD-10-CM

## 2018-08-05 DIAGNOSIS — N3281 Overactive bladder: Secondary | ICD-10-CM

## 2018-08-05 NOTE — Progress Notes (Signed)
Location:  Occupational psychologist of Service:  SNF (31) Provider:   Cindi Carbon, ANP [place] [phone]   [name], DO  Patient Care Team: [name], DO as PCP - General (Geriatric Medicine)  Extended Emergency Contact Information Primary Emergency Contact: Alley,Kelly Address: [address]          [place], [place] [address] [name] of [place] Phone: [phone] Relation: Daughter Secondary Emergency Contact: Kanner,[name] Address: [address] [place] [address] [place] of [place] Phone: [phone] Relation: Son  Code Status:  DNR Goals of care: Advanced Directive information Advanced Directives 06/11/2018  Does Patient Have a Medical Advance Directive? Yes  Type of Paramedic of [place];Out of facility DNR (pink MOST or yellow form)  Does patient want to make changes to medical advance directive? No - Patient declined  Copy of [place] in Chart? Yes - validated most recent copy scanned in chart (See row information)  Pre-existing out of facility DNR order (yellow form or pink MOST form) Yellow form placed in chart (order not valid for inpatient use);Pink MOST form placed in chart (order not valid for inpatient use)     Chief Complaint  Patient presents with  . Medical Management of Chronic Issues    HPI:  Pt is a 78 y.o. male seen today for medical management of chronic diseases.    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 His nurse reports that he has periods of lethargy and periods of alertness. He continues to show signs of aggression with hitting during care.  Followed by Dr. Casimiro Needle.   Hyperuricemia: no episodes of gout  Constipation: regular BMs with miralax  Hx of OA knee pain but has not shown any signs or symptoms of pain  Dysphagia:  mechanical soft diet tolerating well, has lost 4 lbs since Dec Wt Readings from Last 3 Encounters:  08/05/18 156 lb 14.4 oz (71.2 kg)  07/05/18 159 lb 12.8 oz (72.5 kg)  06/11/18 161 lb (73 kg)    Functional status: hoyer lift, incontinent Past Medical History:  Diagnosis Date  . Dementia ([place])   . Diabetes ([place])   . Heart disease   . Hyperlipidemia   . Hypotension    Past Surgical History:  Procedure Laterality Date  . APPENDECTOMY  1961  . CORONARY ANGIOPLASTY WITH STENT PLACEMENT  2016  . TONSILLECTOMY AND ADENOIDECTOMY    . TOTAL KNEE ARTHROPLASTY  2012    Allergies  Allergen Reactions  . Lortab [Hydrocodone-Acetaminophen] Other (See Comments)    Reaction:  Affected pts cognition   . Morphine And Related Other (See Comments)    Reaction:  Affected pts cognition     Outpatient Encounter Medications as of 08/05/2018  Medication Sig  . allopurinol (ZYLOPRIM) 100 MG tablet Take 1 tablet (100 mg total) by mouth daily.  . bisacodyl (DULCOLAX) 10 MG suppository Place 10 mg rectally daily as needed (constipation).   . carbamazepine (TEGRETOL) 200 MG tablet Take 200 mg by mouth every morning. 2 tablets (400 mg) at bedtime  . clopidogrel (PLAVIX) 75 MG tablet Take 1 tablet (75 mg total) by mouth daily.  Marland Kitchen gabapentin (NEURONTIN) 100 MG capsule Take 100 mg by mouth 2 (two) times daily.   Marland Kitchen GLUCERNA (GLUCERNA) LIQD Take 237 mLs by  mouth daily.  . hydrOXYzine (VISTARIL) 25 MG capsule Take 25 mg by mouth at bedtime.  Marland Kitchen ipratropium-albuterol (DUONEB) 0.5-2.5 (3) MG/3ML SOLN Take 3 mLs by nebulization 3 (three) times daily as needed (shortness of breath/cough).   . nitroGLYCERIN (NITROSTAT) 0.4 MG SL tablet Place 0.4 mg under the tongue every 5 (five) minutes as needed for chest pain.  . polyethylene glycol (MIRALAX / GLYCOLAX) packet Take 17 g by mouth daily.  Marland Kitchen senna-docusate (SENOKOT-S) 8.6-50 MG tablet Take 1 tablet by mouth 2 (two) times daily.   No facility-administered encounter  medications on file as of 08/05/2018.     Review of Systems  Unable to perform ROS: Dementia    Immunization History  Administered Date(s) Administered  . Influenza,inj,Quad PF,6+ Mos 04/23/2018  . Influenza-Unspecified 05/05/2010, 04/11/2013, 03/03/2015, 09/06/2016, 05/10/2017  . Pneumococcal Polysaccharide-23 12/25/2010  . Pneumococcal-Unspecified 02/28/2013  . Td 06/01/2017  . Tdap 03/22/2006  . Zoster 11/01/2012  . Zoster Recombinat (Shingrix) 05/25/2017, 09/25/2017   Pertinent  Health Maintenance Due  Topic Date Due  . HEMOGLOBIN A1C  12/22/2017  . OPHTHALMOLOGY EXAM  08/05/2018 (Originally 01/15/1951)  . FOOT EXAM  05/14/2019  . INFLUENZA VACCINE  Completed  . PNA vac Low Risk Adult  Completed  . COLONOSCOPY  Discontinued   Fall Risk  05/29/2018 05/29/2017 04/05/2016  Falls in the past year? 0 No Yes  Number falls in past yr: 0 - 1  Injury with Fall? 0 - Yes   Functional Status Survey:    Vitals:   08/05/18 1430  Weight: 156 lb 14.4 oz (71.2 kg)   Body mass index is 26.11 kg/m. Physical Exam Vitals signs and nursing note reviewed.  Constitutional:      General: He is not in acute distress.    Appearance: He is not diaphoretic.     Comments: Eyes closed   HENT:     Head: Normocephalic and atraumatic.  Neck:     Thyroid: No thyromegaly.     Vascular: No JVD.     Trachea: No tracheal deviation.  Cardiovascular:     Rate and Rhythm: Normal rate and regular rhythm.     Heart sounds: No murmur.  Pulmonary:     Effort: Pulmonary effort is normal. No respiratory distress.     Breath sounds: Normal breath sounds. No wheezing.  Abdominal:     General: Bowel sounds are normal. There is no distension.     Palpations: Abdomen is soft.     Tenderness: There is no abdominal tenderness.  Lymphadenopathy:     Cervical: No cervical adenopathy.  Skin:    General: Skin is warm and dry.  Neurological:     Mental Status: Mental status is at baseline.     Comments:  Rigidity and resistance noted to BUE and BLE. Eyes closed but arouses with verbal stim. Not verbal and not able to f/c.     Labs reviewed: Recent Labs    05/17/18 0530 05/20/18  NA 140 141  K 4.6 4.4  BUN 23* 35*  CREATININE 0.9 1.1   Recent Labs    05/17/18 0530 05/20/18  AST 11* 9*  ALT 11 6*  ALKPHOS 106 92   Recent Labs    05/20/18  WBC 9.1  NEUTROABS 7  HGB 11.4*  HCT 33*  PLT 230   Lab Results  Component Value Date   TSH 0.77 10/31/2016   Lab Results  Component Value Date   HGBA1C 6.8 06/23/2017   Lab Results  Component Value Date   CHOL 124 10/26/2016   HDL 40 10/26/2016   LDLCALC 62 10/26/2016   TRIG 111 10/26/2016    Significant Diagnostic Results in last 30 days:  No results found.  Assessment/Plan  1. 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 Requires assistance for all ADLs and personal care. Continue supportive care in the skilled setting. Continue tegretol prescribed by Dr. Casimiro Needle. Levels checked in Nov of 2019 were ok.  2. Other polyneuropathy No issues with pain, reported. Continue neurontin for pain but also for synergistic action with tegretol  3. Bilateral primary osteoarthritis of knee No new issues, continue prn tylenol.   4. OAB (overactive bladder) No reported issues.   5. Hyperuricemia Continue allopurinol 100 mg qd  6. Slow transit constipation Controlled with miralax and senokot s    Family/ staff Communication: discussed w/staff  Labs/tests ordered:  NA

## 2018-09-10 ENCOUNTER — Non-Acute Institutional Stay (SKILLED_NURSING_FACILITY): Payer: PPO | Admitting: Internal Medicine

## 2018-09-10 ENCOUNTER — Encounter: Payer: Self-pay | Admitting: Internal Medicine

## 2018-09-10 DIAGNOSIS — F0280 Dementia in other diseases classified elsewhere without behavioral disturbance: Secondary | ICD-10-CM

## 2018-09-10 DIAGNOSIS — E1149 Type 2 diabetes mellitus with other diabetic neurological complication: Secondary | ICD-10-CM | POA: Diagnosis not present

## 2018-09-10 DIAGNOSIS — I119 Hypertensive heart disease without heart failure: Secondary | ICD-10-CM

## 2018-09-10 DIAGNOSIS — M170 Bilateral primary osteoarthritis of knee: Secondary | ICD-10-CM | POA: Diagnosis not present

## 2018-09-10 DIAGNOSIS — I25118 Atherosclerotic heart disease of native coronary artery with other forms of angina pectoris: Secondary | ICD-10-CM

## 2018-09-10 DIAGNOSIS — N400 Enlarged prostate without lower urinary tract symptoms: Secondary | ICD-10-CM

## 2018-09-10 DIAGNOSIS — G3109 Other frontotemporal dementia: Secondary | ICD-10-CM

## 2018-09-10 NOTE — Progress Notes (Signed)
Patient ID: Jose Castillo, male   DOB: 1940-09-08, 78 y.o.   MRN: [id number]  Location:  [facility] Room Number: 355 Place of Service:  SNF ([phone]) Provider:   [name], DO  Patient Care Team: [name], DO as PCP - General (Geriatric Medicine)  Extended Emergency Contact Information Primary Emergency Contact: Alley,Kelly Address: [address]          [place], [place] [address] [name] of [place] Phone: [phone] Relation: Daughter Secondary Emergency Contact: Kruschke,[name] Address: [address] [place] [address] [place] of [place] Phone: [phone] Relation: Son  Code Status:  DNR Goals of care: Advanced Directive information Advanced Directives 06/11/2018  Does Patient Have a Medical Advance Directive? Yes  Type of Paramedic of [place];Out of facility DNR (pink MOST or yellow form)  Does patient want to make changes to medical advance directive? No - Patient declined  Copy of [place] in Chart? Yes - validated most recent copy scanned in chart (See row information)  Pre-existing out of facility DNR order (yellow form or pink MOST form) Yellow form placed in chart (order not valid for inpatient use);Pink MOST form placed in chart (order not valid for inpatient use)     Chief Complaint  Patient presents with  . Medical Management of Chronic Issues    Routine Visit    HPI:  Pt is a 78 y.o. male seen today for medical management of chronic diseases.  Pt lives in SNF for long-term care. Previously was in memory care when mobile and active.  Now requires hoyer for transfers and sits in highbacked wheelchair.    His diabetes with neuropathy is managed with diet only with great control--he is being treated paliatively at this point.   Lab Results  Component Value Date   HGBA1C 6.8 06/23/2017   He has had difficulty maintaining normal sleep cycles  with his dementia and remains on hydroxyzine for this.  He takes tegretol in the morning for his behaviors which have included removing his clothing in the public space and urinating in inappropriate places prior to starting this medication.  He currently struggles to take his night time meds--spits them out quite often according to the nursing notes.  He is often too sleepy in the daytime to take his meds.  Past Medical History:  Diagnosis Date  . Dementia ([place])   . Diabetes ([place])   . Heart disease   . Hyperlipidemia   . Hypotension    Past Surgical History:  Procedure Laterality Date  . APPENDECTOMY  1961  . CORONARY ANGIOPLASTY WITH STENT PLACEMENT  2016  . TONSILLECTOMY AND ADENOIDECTOMY    . TOTAL KNEE ARTHROPLASTY  2012    Allergies  Allergen Reactions  . Lortab [Hydrocodone-Acetaminophen] Other (See Comments)    Reaction:  Affected pts cognition   . Morphine And Related Other (See Comments)    Reaction:  Affected pts cognition     Outpatient Encounter Medications as of 09/10/2018  Medication Sig  . allopurinol (ZYLOPRIM) 100 MG tablet Take 1 tablet (100 mg total) by mouth daily.  . bisacodyl (DULCOLAX) 10 MG suppository Place 10 mg rectally daily as needed (constipation).   . carbamazepine (TEGRETOL) 200 MG tablet Take 200 mg by mouth every morning. 2 tablets (400 mg) at bedtime  . clopidogrel (PLAVIX) 75 MG tablet Take 1 tablet (75 mg total) by  mouth daily.  Marland Kitchen gabapentin (NEURONTIN) 100 MG capsule Take 100 mg by mouth 2 (two) times daily.   Marland Kitchen GLUCERNA (GLUCERNA) LIQD Take 237 mLs by mouth daily.  . hydrOXYzine (VISTARIL) 25 MG capsule Take 25 mg by mouth at bedtime.  Marland Kitchen ipratropium-albuterol (DUONEB) 0.5-2.5 (3) MG/3ML SOLN Take 3 mLs by nebulization 3 (three) times daily as needed (shortness of breath/cough).   . nitroGLYCERIN (NITROSTAT) 0.4 MG SL tablet Place 0.4 mg under the tongue every 5 (five) minutes as needed for chest pain.  . polyethylene glycol (MIRALAX /  GLYCOLAX) packet Take 17 g by mouth daily.  Marland Kitchen senna-docusate (SENOKOT-S) 8.6-50 MG tablet Take 1 tablet by mouth 2 (two) times daily.   No facility-administered encounter medications on file as of 09/10/2018.     Review of Systems  Constitutional: Positive for fatigue. Negative for activity change, appetite change, chills, fever and unexpected weight change.  HENT: Negative for congestion.   Eyes: Negative for visual disturbance.  Respiratory: Positive for cough. Negative for chest tightness and shortness of breath.   Cardiovascular: Negative for chest pain and leg swelling.  Gastrointestinal: Negative for abdominal pain, blood in stool, constipation, diarrhea, nausea and vomiting.       Incontinent  Genitourinary:       Incontinent  Musculoskeletal: Positive for gait problem.       Uses manual high backed tiltable wheelchair  Skin: Negative for color change.  Neurological: Negative for weakness.  Psychiatric/Behavioral: Positive for agitation, behavioral problems and confusion. Negative for decreased concentration and sleep disturbance. The patient is not nervous/anxious.        Sleeping at night; does have more active period late afternoon and early evening     Immunization History  Administered Date(s) Administered  . Influenza,inj,Quad PF,6+ Mos 04/23/2018  . Influenza-Unspecified 05/05/2010, 04/11/2013, 03/03/2015, 09/06/2016, 05/10/2017  . Pneumococcal Polysaccharide-23 12/25/2010  . Pneumococcal-Unspecified 02/28/2013  . Td 06/01/2017  . Tdap 03/22/2006  . Zoster 11/01/2012  . Zoster Recombinat (Shingrix) 05/25/2017, 09/25/2017   Pertinent  Health Maintenance Due  Topic Date Due  . OPHTHALMOLOGY EXAM  01/15/1951  . HEMOGLOBIN A1C  12/22/2017  . FOOT EXAM  05/14/2019  . INFLUENZA VACCINE  Completed  . PNA vac Low Risk Adult  Completed  . COLONOSCOPY  Discontinued   Fall Risk  05/29/2018 05/29/2017 04/05/2016  Falls in the past year? 0 No Yes  Number falls in past yr:  0 - 1  Injury with Fall? 0 - Yes   Functional Status Survey:    Vitals:   09/10/18 1338  BP: 96/63  Pulse: (!) 50  Resp: 17  Temp: 98 F (36.7 C)  TempSrc: Oral  SpO2: 98%  Weight: 158 lb (71.7 kg)  Height: 5\' 5"  (1.651 m)   Body mass index is 26.29 kg/m. Physical Exam Vitals signs and nursing note reviewed.  Constitutional:      General: He is not in acute distress.    Appearance: Normal appearance. He is normal weight. He is not toxic-appearing.  HENT:     Head: Normocephalic and atraumatic.  Cardiovascular:     Rate and Rhythm: Normal rate and regular rhythm.     Heart sounds: Murmur present.  Pulmonary:     Effort: Pulmonary effort is normal.     Breath sounds: Normal breath sounds.  Abdominal:     General: Bowel sounds are normal. There is no distension.     Palpations: Abdomen is soft.  Musculoskeletal: Normal range of motion.  Skin:  General: Skin is warm and dry.  Neurological:     General: No focal deficit present.     Mental Status: He is alert. Mental status is at baseline.     Comments: Seated in manual high backed wheelchair tilted back, rocks back and forth grabbing onto hoyer straps  Psychiatric:        Mood and Affect: Mood normal.     Comments: Smiles when spoken to     Labs reviewed: Recent Labs    05/17/18 0530 05/20/18  NA 140 141  K 4.6 4.4  BUN 23* 35*  CREATININE 0.9 1.1   Recent Labs    05/17/18 0530 05/20/18  AST 11* 9*  ALT 11 6*  ALKPHOS 106 92   Recent Labs    05/20/18  WBC 9.1  NEUTROABS 7  HGB 11.4*  HCT 33*  PLT 230   Lab Results  Component Value Date   TSH 0.77 10/31/2016   Lab Results  Component Value Date   HGBA1C 6.8 06/23/2017   Lab Results  Component Value Date   CHOL 124 10/26/2016   HDL 40 10/26/2016   LDLCALC 62 10/26/2016   TRIG 111 10/26/2016    Assessment/Plan 1. Type 2 diabetes mellitus with neurological complications ([facility]) -glucose well controlled w/o meds, due to his goals of  care, he's no longer on guideline-based therapies for this like ace/arb, asa, statin  2. 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 Bilateral primary osteoarthritis of knee -does not show any s/s of knee pain since he no longer bears weight, can always receive tylenol if signs of distress via standing orders  4. Coronary artery disease of native artery of native heart with stable angina pectoris ([place]) -on chronic plavix with prior stents, no recent episodes of chest pain requiring ntg  5. Benign hypertensive heart disease without heart failure -bp well controlled--no longer requires meds to lower bp  6.  BPH w/o LUTs -now incontinent of bladder and bowel so unclear if having any luts  Family/ staff Communication: discussed with snf nurse  Labs/tests ordered:  No new  Daron Breeding L. Kayleana Waites, D.O. [facility] [address], [place] [address] Cell Phone (Mon-Fri 8am-5pm):  [phone] On Call:  [phone] & follow prompts after 5pm & weekends Office Phone:  [phone] Office Fax:  [phone]

## 2018-10-04 ENCOUNTER — Non-Acute Institutional Stay (SKILLED_NURSING_FACILITY): Payer: PPO | Admitting: Adult Health

## 2018-10-04 DIAGNOSIS — E039 Hypothyroidism, unspecified: Secondary | ICD-10-CM | POA: Diagnosis not present

## 2018-10-04 DIAGNOSIS — G3109 Other frontotemporal dementia: Secondary | ICD-10-CM | POA: Diagnosis not present

## 2018-10-04 DIAGNOSIS — K5901 Slow transit constipation: Secondary | ICD-10-CM | POA: Diagnosis not present

## 2018-10-04 DIAGNOSIS — F0280 Dementia in other diseases classified elsewhere without behavioral disturbance: Secondary | ICD-10-CM

## 2018-10-04 DIAGNOSIS — I119 Hypertensive heart disease without heart failure: Secondary | ICD-10-CM | POA: Diagnosis not present

## 2018-10-04 DIAGNOSIS — E1142 Type 2 diabetes mellitus with diabetic polyneuropathy: Secondary | ICD-10-CM

## 2018-10-04 DIAGNOSIS — E785 Hyperlipidemia, unspecified: Secondary | ICD-10-CM | POA: Diagnosis not present

## 2018-10-04 DIAGNOSIS — E1149 Type 2 diabetes mellitus with other diabetic neurological complication: Secondary | ICD-10-CM | POA: Diagnosis not present

## 2018-10-09 ENCOUNTER — Encounter: Payer: Self-pay | Admitting: Adult Health

## 2018-10-09 NOTE — Progress Notes (Signed)
Location:  [facility]up[facility] of Service:  SNF (31) Provider:   Cindi Carbon, ANP [place] [facility] [phone]   [name], DO  Patient Care Team: [name], DO as PCP - General (Geriatric Medicine)  Extended Emergency Contact Information Primary Emergency Contact: Alley,Kelly Address: [address]          [place], [place] [address] [name] of [place] Phone: [phone] Relation: Daughter Secondary Emergency Contact: Spahr,[name] Address: [address] [place] [address] [place] Phone: [phone] Relation: Son  Code Status:  DNR Goals of care: Advanced Directive information Advanced Directives 06/11/2018  Does Patient Have a Medical Advance Directive? Yes  Type of Paramedic of [place];Out of facility DNR (pink MOST or yellow form)  Does patient want to make changes to medical advance directive? No - Patient declined  Copy of [place] in Chart? Yes - validated most recent copy scanned in chart (See row information)  Pre-existing out of facility DNR order (yellow form or pink MOST form) Yellow form placed in chart (order not valid for inpatient use);Pink MOST form placed in chart (order not valid for inpatient use)     Chief Complaint  Patient presents with  . Medical Management of Chronic Issues    HPI:  Pt is a 78 y.o. male seen today for medical management of chronic diseases.   Frontal dementia: severe with minimal verbalization, requires assistance with all ADLs. Continues with periods of aggression during personal care as well as periods of alertness and lethargy.  CAD with hx of stent placement in 2016: no cp, sob, edema, weight gain DM II: CBGs 128, 156, 161 fasting Neuropathy no reported pain No reported issues with bowels or bladder Weights trending up Wt Readings from Last 3 Encounters:  10/09/18 162 lb 12.8 oz (73.8 kg)   09/10/18 158 lb (71.7 kg)  08/05/18 156 lb 14.4 oz (71.2 kg)    Functional status: hoyer lift, incontinent Past Medical History:  Diagnosis Date  . Dementia ([place])   . Diabetes ([place])   . Heart disease   . Hyperlipidemia   . Hypotension    Past Surgical History:  Procedure Laterality Date  . APPENDECTOMY  1961  . CORONARY ANGIOPLASTY WITH STENT PLACEMENT  2016  . TONSILLECTOMY AND ADENOIDECTOMY    . TOTAL KNEE ARTHROPLASTY  2012    Allergies  Allergen Reactions  . Lortab [Hydrocodone-Acetaminophen] Other (See Comments)    Reaction:  Affected pts cognition   . Morphine And Related Other (See Comments)    Reaction:  Affected pts cognition     Outpatient Encounter Medications as of 10/04/2018  Medication Sig  . allopurinol (ZYLOPRIM) 100 MG tablet Take 1 tablet (100 mg total) by mouth daily.  . bisacodyl (DULCOLAX) 10 MG suppository Place 10 mg rectally daily as needed (constipation).   . carbamazepine (TEGRETOL) 200 MG tablet Take 200 mg by mouth every morning. 2 tablets (400 mg) at bedtime  . clopidogrel (PLAVIX) 75 MG tablet Take 1 tablet (75 mg total) by mouth daily.  Marland Kitchen gabapentin (NEURONTIN) 100 MG capsule Take 100 mg by mouth 2 (two) times daily.   Marland Kitchen GLUCERNA (GLUCERNA) LIQD Take 237 mLs by mouth daily.  . hydrOXYzine (VISTARIL) 25 MG capsule Take 25 mg by mouth at bedtime.  Marland Kitchen ipratropium-albuterol (DUONEB) 0.5-2.5 (3) MG/3ML SOLN Take 3 mLs by nebulization 3 (three)  times daily as needed (shortness of breath/cough).   . nitroGLYCERIN (NITROSTAT) 0.4 MG SL tablet Place 0.4 mg under the tongue every 5 (five) minutes as needed for chest pain.  . polyethylene glycol (MIRALAX / GLYCOLAX) packet Take 17 g by mouth daily.  Marland Kitchen senna-docusate (SENOKOT-S) 8.6-50 MG tablet Take 1 tablet by mouth 2 (two) times daily.   No facility-administered encounter medications on file as of 10/04/2018.     Review of Systems  Unable to perform ROS: Dementia    Immunization History   Administered Date(s) Administered  . Influenza,inj,Quad PF,6+ Mos 04/23/2018  . Influenza-Unspecified 05/05/2010, 04/11/2013, 03/03/2015, 09/06/2016, 05/10/2017  . Pneumococcal Polysaccharide-23 12/25/2010  . Pneumococcal-Unspecified 02/28/2013  . Td 06/01/2017  . Tdap 03/22/2006  . Zoster 11/01/2012  . Zoster Recombinat (Shingrix) 05/25/2017, 09/25/2017   Pertinent  Health Maintenance Due  Topic Date Due  . INFLUENZA VACCINE  02/01/2019  . PNA vac Low Risk Adult  Completed   Fall Risk  09/21/2018 05/29/2018 05/29/2017 04/05/2016  Falls in the past year? Exclusion - non ambulatory 0 No Yes  Number falls in past yr: - 0 - 1  Injury with Fall? - 0 - Yes   Functional Status Survey:    Vitals:   10/09/18 0715  Weight: 162 lb 12.8 oz (73.8 kg)   Body mass index is 27.09 kg/m. Physical Exam Constitutional:      General: He is not in acute distress.    Appearance: He is not diaphoretic.     Comments: Eyes closed, moving arms and legs  HENT:     Head: Normocephalic and atraumatic.  Neck:     Thyroid: No thyromegaly.     Vascular: No JVD.     Trachea: No tracheal deviation.  Cardiovascular:     Rate and Rhythm: Normal rate and regular rhythm.     Heart sounds: No murmur.  Pulmonary:     Effort: Pulmonary effort is normal. No respiratory distress.     Breath sounds: Normal breath sounds. No wheezing.  Abdominal:     General: Bowel sounds are normal. There is no distension.     Palpations: Abdomen is soft.     Tenderness: There is no abdominal tenderness.  Musculoskeletal:        General: No swelling, tenderness, deformity or signs of injury.     Right lower leg: No edema.     Left lower leg: No edema.  Lymphadenopathy:     Cervical: No cervical adenopathy.  Skin:    General: Skin is warm and dry.  Neurological:     Mental Status: Mental status is at baseline.     Comments: Not verbal and not able to f/c.  Rigidity noted to BUE and BLE     Labs reviewed: Recent  Labs    05/17/18 0530 05/20/18  NA 140 141  K 4.6 4.4  BUN 23* 35*  CREATININE 0.9 1.1   Recent Labs    05/17/18 0530 05/20/18  AST 11* 9*  ALT 11 6*  ALKPHOS 106 92   Recent Labs    05/20/18  WBC 9.1  NEUTROABS 7  HGB 11.4*  HCT 33*  PLT 230   Lab Results  Component Value Date   TSH 0.77 10/31/2016   Lab Results  Component Value Date   HGBA1C 6.8 06/23/2017   Lab Results  Component Value Date   CHOL 124 10/26/2016   HDL 40 10/26/2016   LDLCALC 62 10/26/2016   TRIG 111 10/26/2016  Significant Diagnostic Results in last 30 days:  No results found.  Assessment/Plan 1. Type 2 diabetes mellitus with neurological complications (HCC) Controlled without meds Monitor A1C q 6 months  2. Diabetic peripheral neuropathy associated with type 2 diabetes mellitus (HCC) Controlled, continue neurontin 100 mg bid  3. 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 Slow transit constipation Continue miralax 17 grams qd and senokot s 1 tab bid  5. Benign hypertensive heart disease without heart failure Continue plavix 75 mg qd No longer monitoring lipids due to severe dementia, debility    Family/ staff Communication: staff  Labs/tests ordered:  NA

## 2018-11-01 ENCOUNTER — Non-Acute Institutional Stay (SKILLED_NURSING_FACILITY): Payer: PPO | Admitting: Adult Health

## 2018-11-01 DIAGNOSIS — F0280 Dementia in other diseases classified elsewhere without behavioral disturbance: Secondary | ICD-10-CM | POA: Diagnosis not present

## 2018-11-01 DIAGNOSIS — K5901 Slow transit constipation: Secondary | ICD-10-CM

## 2018-11-01 DIAGNOSIS — G3109 Other frontotemporal dementia: Secondary | ICD-10-CM

## 2018-11-01 DIAGNOSIS — N400 Enlarged prostate without lower urinary tract symptoms: Secondary | ICD-10-CM

## 2018-11-01 DIAGNOSIS — E790 Hyperuricemia without signs of inflammatory arthritis and tophaceous disease: Secondary | ICD-10-CM

## 2018-11-01 DIAGNOSIS — E1149 Type 2 diabetes mellitus with other diabetic neurological complication: Secondary | ICD-10-CM

## 2018-11-01 DIAGNOSIS — E1142 Type 2 diabetes mellitus with diabetic polyneuropathy: Secondary | ICD-10-CM

## 2018-11-04 ENCOUNTER — Other Ambulatory Visit: Payer: Self-pay

## 2018-11-04 ENCOUNTER — Encounter: Payer: Self-pay | Admitting: Adult Health

## 2018-11-04 NOTE — Progress Notes (Addendum)
Location:  Occupational psychologist of Service:  SNF (31) Provider:   Cindi Carbon, ANP [place] [phone]   Jose Curry, DO  Patient Care Team: Jose Curry, DO as PCP - General (Geriatric Medicine)  Extended Emergency Contact Information Primary Emergency Contact: Castillo,Jose Address: [address]          [place], [place] [address] Jose Castillo Phone: [phone] Relation: Daughter Secondary Emergency Contact: Castillo,Jose Address: [address] [place] [address] [place] Phone: [phone] Relation: Son  Code Status:  DNR Goals of care: Advanced Directive information Advanced Directives 06/11/2018  Does Patient Have a Medical Advance Directive? Yes  Type of Paramedic of [place];Out of facility DNR (pink MOST or yellow form)  Does patient want to make changes to medical advance directive? No - Patient declined  Copy of [place] in Chart? Yes - validated most recent copy scanned in chart (See row information)  Pre-existing out of facility DNR order (yellow form or pink MOST form) Yellow form placed in chart (order not valid for inpatient use);Pink MOST form placed in chart (order not valid for inpatient use)     Chief Complaint  Patient presents with  . Medical Management of Chronic Issues    HPI:  Pt is a 78 y.o. male seen today for medical management of chronic diseases.   He has a hx of severe frontal dementia. He is non verbal and can not provide a history. He spends most of the day sleeping in a supportive chair. The nurse reports he has periods of alertness and he can at times be restless and resistive to personal care. There are no reports of decreased appetite, difficulty moving bowels, joint pain, or other issues. His BP tends to run low in the 80-100 range. HR runs low at 40-60 range. No issues with syncope, cp, sob, increased  edema, etc. Fasting CBG 142.   Past Medical History:  Diagnosis Date  . Dementia ([place])   . Diabetes ([place])   . Heart disease   . Hyperlipidemia   . Hypotension    Past Surgical History:  Procedure Laterality Date  . APPENDECTOMY  1961  . CORONARY ANGIOPLASTY WITH STENT PLACEMENT  2016  . TONSILLECTOMY AND ADENOIDECTOMY    . TOTAL KNEE ARTHROPLASTY  2012    Allergies  Allergen Reactions  . Lortab [Hydrocodone-Acetaminophen] Other (See Comments)    Reaction:  Affected pts cognition   . Morphine And Related Other (See Comments)    Reaction:  Affected pts cognition     Outpatient Encounter Medications as of 11/01/2018  Medication Sig  . allopurinol (ZYLOPRIM) 100 MG tablet Take 1 tablet (100 mg total) by mouth daily.  . bisacodyl (DULCOLAX) 10 MG suppository Place 10 mg rectally daily as needed (constipation).   . carbamazepine (TEGRETOL) 200 MG tablet Take 200 mg by mouth every morning. 2 tablets (400 mg) at bedtime  . clopidogrel (PLAVIX) 75 MG tablet Take 1 tablet (75 mg total) by mouth daily.  Marland Kitchen gabapentin (NEURONTIN) 100 MG capsule Take 100 mg by mouth 2 (two) times daily.   Marland Kitchen GLUCERNA (GLUCERNA) LIQD Take 237 mLs by mouth daily.  . hydrOXYzine (VISTARIL) 25 MG capsule Take 25 mg by mouth at bedtime.  . nitroGLYCERIN (NITROSTAT) 0.4 MG SL tablet Place 0.4 mg under the tongue every 5 (five) minutes as needed  for chest pain.  . polyethylene glycol (MIRALAX / GLYCOLAX) packet Take 17 g by mouth daily.  Marland Kitchen senna-docusate (SENOKOT-S) 8.6-50 MG tablet Take 1 tablet by mouth 2 (two) times daily.  . [DISCONTINUED] ipratropium-albuterol (DUONEB) 0.5-2.5 (3) MG/3ML SOLN Take 3 mLs by nebulization 3 (three) times daily as needed (shortness of breath/cough).    No facility-administered encounter medications on file as of 11/01/2018.     Review of Systems  Unable to perform ROS: Dementia    Immunization History  Administered Date(s) Administered  . Influenza,inj,Quad PF,6+ Mos  04/23/2018  . Influenza-Unspecified 05/05/2010, 04/11/2013, 03/03/2015, 09/06/2016, 05/10/2017  . Pneumococcal Polysaccharide-23 12/25/2010  . Pneumococcal-Unspecified 02/28/2013  . Td 06/01/2017  . Tdap 03/22/2006  . Zoster 11/01/2012  . Zoster Recombinat (Shingrix) 05/25/2017, 09/25/2017   Pertinent  Health Maintenance Due  Topic Date Due  . INFLUENZA VACCINE  02/01/2019  . PNA vac Low Risk Adult  Completed   Fall Risk  09/21/2018 05/29/2018 05/29/2017 04/05/2016  Falls in the past year? Exclusion - non ambulatory 0 No Yes  Number falls in past yr: - 0 - 1  Injury with Fall? - 0 - Yes   Functional Status Survey:    Vitals:   11/04/18 1630  Weight: 162 lb 4.8 oz (73.6 kg)   Body mass index is 27.01 kg/m. Physical Exam Vitals signs and nursing note reviewed.  Constitutional:      General: He is not in acute distress.    Appearance: He is not toxic-appearing.  Musculoskeletal:     Right lower leg: Edema (pedal ) present.     Left lower leg: Edema (pedal) present.  Skin:    General: Skin is warm and dry.  Neurological:     Mental Status: He is alert. Mental status is at baseline.     Comments: Sleeping but arouses to physical stim. Not verbal.     Labs reviewed: Recent Labs    05/17/18 0530 05/20/18  NA 140 141  K 4.6 4.4  BUN 23* 35*  CREATININE 0.9 1.1   Recent Labs    05/17/18 0530 05/20/18  AST 11* 9*  ALT 11 6*  ALKPHOS 106 92   Recent Labs    05/20/18  WBC 9.1  NEUTROABS 7  HGB 11.4*  HCT 33*  PLT 230   Lab Results  Component Value Date   TSH 0.77 10/31/2016   Lab Results  Component Value Date   HGBA1C 6.8 06/23/2017   Lab Results  Component Value Date   CHOL 124 10/26/2016   HDL 40 10/26/2016   LDLCALC 62 10/26/2016   TRIG 111 10/26/2016    Significant Diagnostic Results in last 30 days:  No results found.  Assessment/Plan 1. Type 2 diabetes mellitus with neurological complications ([place]) Diet controlled Lab Results   Component Value Date   HGBA1C 6.8 06/23/2017  No longer got for eye exams due to behavioral issues. Needs A1C  (when covid restrictions removed)   2. Frontal dementia (HCC) Severe in nature. Requires assistance with all ADLs. Has a DNR and a most form in place. Continue supportive care only. Followed by Dr. Casimiro Needle.  3. Hyperuricemia No new events. Continue Allopurinol.   4. Slow transit constipation Controlled continue miralax 17 grams wd and senokot s 1 tab bid  5. BPH without obstruction/lower urinary tract symptoms No symptoms reported, incontinent Continue to monitor.   6. Diabetic peripheral neuropathy associated with type 2 diabetes mellitus ([place]) Controlled with neurontin 100 mg bid  Family/ staff Communication: staff  Labs/tests ordered: NA

## 2018-12-10 ENCOUNTER — Non-Acute Institutional Stay (SKILLED_NURSING_FACILITY): Payer: PPO | Admitting: Internal Medicine

## 2018-12-10 ENCOUNTER — Encounter: Payer: Self-pay | Admitting: Internal Medicine

## 2018-12-10 DIAGNOSIS — I119 Hypertensive heart disease without heart failure: Secondary | ICD-10-CM

## 2018-12-10 DIAGNOSIS — G3109 Other frontotemporal dementia: Secondary | ICD-10-CM | POA: Diagnosis not present

## 2018-12-10 DIAGNOSIS — E1149 Type 2 diabetes mellitus with other diabetic neurological complication: Secondary | ICD-10-CM

## 2018-12-10 DIAGNOSIS — M170 Bilateral primary osteoarthritis of knee: Secondary | ICD-10-CM | POA: Diagnosis not present

## 2018-12-10 DIAGNOSIS — I25118 Atherosclerotic heart disease of native coronary artery with other forms of angina pectoris: Secondary | ICD-10-CM

## 2018-12-10 DIAGNOSIS — F0280 Dementia in other diseases classified elsewhere without behavioral disturbance: Secondary | ICD-10-CM | POA: Diagnosis not present

## 2018-12-10 NOTE — Progress Notes (Signed)
Patient ID: Jose Castillo, male   DOB: [birth year], 78 y.o.   MRN: [id number]  Location:  [facility] Room Number: 831 Place of Service:  SNF ([phone]) Provider:   [name], DO  Patient Care Team: [name], DO as PCP - General (Geriatric Medicine)  Extended Emergency Contact Information Primary Emergency Contact: Alley,Kelly Address: [address]          [place], [place] [address] [name] of [place] Phone: [phone] Relation: Daughter Secondary Emergency Contact: [name],[name] Address: [address] [place] [address] [place] Phone: [phone] Relation: Son  Code Status:  DNR, MOST Goals of care: Advanced Directive information Advanced Directives 06/11/2018  Does Patient Have a Medical Advance Directive? Yes  Type of Paramedic of [place];Out of facility DNR (pink MOST or yellow form)  Does patient want to make changes to medical advance directive? No - Patient declined  Copy of [place] in Chart? Yes - validated most recent copy scanned in chart (See row information)  Pre-existing out of facility DNR order (yellow form or pink MOST form) Yellow form placed in chart (order not valid for inpatient use);Pink MOST form placed in chart (order not valid for inpatient use)     Chief Complaint  Patient presents with  . Medical Management of Chronic Issues    Routine Visit    HPI:  Pt is a 78 y.o. male seen today for medical management of chronic diseases.    [name] has had some episodes of loud grunting.  He is no longer on scheduled tylenol because he was not having any evidence of pain.  After an episode recently he was given tylenol and put in the bed (from his wheelchair).  He stopped grunting.  He actually outright said his pain was in his hip to the nurse.  They are going to start to let him rest in the bed instead of the chair.  Diabetes  has been controlled long-term.   Lab Results  Component Value Date   HGBA1C 6.8 06/23/2017    He has late stage dementia and is on comfort care so checking his cholesterol no longer makes sense with his goals of care.  Past Medical History:  Diagnosis Date  . Dementia ([place])   . Diabetes ([place])   . Heart disease   . Hyperlipidemia   . Hypotension    Past Surgical History:  Procedure Laterality Date  . APPENDECTOMY  1961  . CORONARY ANGIOPLASTY WITH STENT PLACEMENT  2016  . TONSILLECTOMY AND ADENOIDECTOMY    . TOTAL KNEE ARTHROPLASTY  2012    Allergies  Allergen Reactions  . Lortab [Hydrocodone-Acetaminophen] Other (See Comments)    Reaction:  Affected pts cognition   . Morphine And Related Other (See Comments)    Reaction:  Affected pts cognition     Outpatient Encounter Medications as of 12/10/2018  Medication Sig  . acetaminophen (TYLENOL) 325 MG tablet Take 650 mg by mouth every 4 (four) hours as needed.  Marland Kitchen allopurinol (ZYLOPRIM) 100 MG tablet Take 1 tablet (100 mg total) by mouth daily.  . bisacodyl (DULCOLAX) 10 MG suppository Place 10 mg rectally daily as needed (constipation).   . carbamazepine (TEGRETOL) 200 MG tablet Take 200 mg by mouth every morning. 2 tablets (400 mg) at bedtime  . clopidogrel (PLAVIX) 75 MG tablet Take 1 tablet (75 mg total) by  mouth daily.  Marland Kitchen gabapentin (NEURONTIN) 100 MG capsule Take 100 mg by mouth 2 (two) times daily.   Marland Kitchen GLUCERNA (GLUCERNA) LIQD Take 237 mLs by mouth daily.  . hydrOXYzine (VISTARIL) 25 MG capsule Take 25 mg by mouth at bedtime.  . polyethylene glycol (MIRALAX / GLYCOLAX) packet Take 17 g by mouth daily.  Marland Kitchen senna-docusate (SENOKOT-S) 8.6-50 MG tablet Take 1 tablet by mouth 2 (two) times daily.  . [DISCONTINUED] nitroGLYCERIN (NITROSTAT) 0.4 MG SL tablet Place 0.4 mg under the tongue every 5 (five) minutes as needed for chest pain.   No facility-administered encounter medications on file as of 12/10/2018.     Review of  Systems  Constitutional: Negative for activity change, appetite change, chills and fever.  HENT: Negative for congestion.   Eyes: Negative for visual disturbance.  Respiratory: Positive for cough. Negative for shortness of breath.   Cardiovascular: Negative for chest pain, palpitations and leg swelling.  Gastrointestinal: Negative for abdominal pain, constipation, diarrhea and nausea.  Genitourinary: Negative for dysuria.  Musculoskeletal: Positive for arthralgias and gait problem. Negative for back pain.  Skin: Negative for color change.    Immunization History  Administered Date(s) Administered  . Influenza,inj,Quad PF,6+ Mos 04/23/2018  . Influenza-Unspecified 05/05/2010, 04/11/2013, 03/03/2015, 09/06/2016, 05/10/2017  . Pneumococcal Polysaccharide-23 12/25/2010  . Pneumococcal-Unspecified 02/28/2013  . Td 06/01/2017  . Tdap 03/22/2006  . Zoster 11/01/2012  . Zoster Recombinat (Shingrix) 05/25/2017, 09/25/2017   Pertinent  Health Maintenance Due  Topic Date Due  . INFLUENZA VACCINE  02/01/2019  . PNA vac Low Risk Adult  Completed   Fall Risk  09/21/2018 05/29/2018 05/29/2017 04/05/2016  Falls in the past year? Exclusion - non ambulatory 0 No Yes  Number falls in past yr: - 0 - 1  Injury with Fall? - 0 - Yes   Functional Status Survey:    Vitals:   12/10/18 1409  BP: 93/62  Pulse: (!) 58  Resp: 18  Temp: 98.2 F (36.8 C)  TempSrc: Oral  SpO2: 97%  Weight: 165 lb (74.8 kg)  Height: 5\' 5"  (1.651 m)   Body mass index is 27.46 kg/m. Physical Exam Constitutional:      Comments: Was sitting up and talking some nonsensical speech today after his lunch  HENT:     Head: Normocephalic and atraumatic.  Cardiovascular:     Rate and Rhythm: Normal rate and regular rhythm.     Pulses: Normal pulses.     Heart sounds: Normal heart sounds.  Pulmonary:     Effort: Pulmonary effort is normal.     Breath sounds: Normal breath sounds.  Abdominal:     General: Bowel sounds  are normal.  Musculoskeletal:     Right lower leg: No edema.     Left lower leg: No edema.  Skin:    General: Skin is warm and dry.  Neurological:     Mental Status: He is alert.     Gait: Gait abnormal.     Comments: Uses manual high back wheelchair; hoyer transfer     Labs reviewed: Recent Labs    05/17/18 0530 05/20/18  NA 140 141  K 4.6 4.4  BUN 23* 35*  CREATININE 0.9 1.1   Recent Labs    05/17/18 0530 05/20/18  AST 11* 9*  ALT 11 6*  ALKPHOS 106 92   Recent Labs    05/20/18  WBC 9.1  NEUTROABS 7  HGB 11.4*  HCT 33*  PLT 230   Lab Results  Component Value Date   TSH 0.77 10/31/2016   Lab Results  Component Value Date   HGBA1C 6.8 06/23/2017   Lab Results  Component Value Date   CHOL 124 10/26/2016   HDL 40 10/26/2016   LDLCALC 62 10/26/2016   TRIG 111 10/26/2016    Significant Diagnostic Results in last 30 days:  No results found.  Assessment/Plan 1. Type 2 diabetes mellitus with neurological complications (HCC) -cont glucerna supplement, feeding, not on meds for diabetes--hba1c has been great for a long time with his decreased intake   2. 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 Bilateral primary osteoarthritis of knee -has had historically, but recent pain complaint was verbalized as hip (?) -cont tylenol prn and gabapentin for his neuropathic pain  4. Benign hypertensive heart disease without heart failure -bp well controlled w/o meds at this point  5. Coronary artery disease of native artery of native heart with stable angina pectoris ([place]) -no notable episodes of chest discomfort or evidence of dyspnea, off several meds related to this due to comfort goals; does remain on plavix with prior stents  Family/ staff  Communication: discussed with snf nurse Labs/tests ordered:  No new  Mayar Whittier L. Joy Reiger, D.O. [facility] [address], [place] [address] Cell Phone (Mon-Fri 8am-5pm):  [phone] On Call:  [phone] & follow prompts after 5pm & weekends Office Phone:  [phone] Office Fax:  [phone]

## 2018-12-12 DIAGNOSIS — Z20828 Contact with and (suspected) exposure to other viral communicable diseases: Secondary | ICD-10-CM | POA: Diagnosis not present

## 2019-01-06 ENCOUNTER — Non-Acute Institutional Stay (SKILLED_NURSING_FACILITY): Payer: PPO | Admitting: Adult Health

## 2019-01-06 DIAGNOSIS — F0280 Dementia in other diseases classified elsewhere without behavioral disturbance: Secondary | ICD-10-CM | POA: Diagnosis not present

## 2019-01-06 DIAGNOSIS — E1149 Type 2 diabetes mellitus with other diabetic neurological complication: Secondary | ICD-10-CM | POA: Diagnosis not present

## 2019-01-06 DIAGNOSIS — N183 Chronic kidney disease, stage 3 unspecified: Secondary | ICD-10-CM

## 2019-01-06 DIAGNOSIS — K5901 Slow transit constipation: Secondary | ICD-10-CM | POA: Diagnosis not present

## 2019-01-06 DIAGNOSIS — I25118 Atherosclerotic heart disease of native coronary artery with other forms of angina pectoris: Secondary | ICD-10-CM

## 2019-01-06 DIAGNOSIS — G3109 Other frontotemporal dementia: Secondary | ICD-10-CM

## 2019-01-06 DIAGNOSIS — E1142 Type 2 diabetes mellitus with diabetic polyneuropathy: Secondary | ICD-10-CM | POA: Diagnosis not present

## 2019-01-06 DIAGNOSIS — I872 Venous insufficiency (chronic) (peripheral): Secondary | ICD-10-CM

## 2019-01-08 ENCOUNTER — Encounter: Payer: Self-pay | Admitting: Adult Health

## 2019-01-08 DIAGNOSIS — N183 Chronic kidney disease, stage 3 unspecified: Secondary | ICD-10-CM | POA: Insufficient documentation

## 2019-01-08 HISTORY — DX: Chronic kidney disease, stage 3 unspecified: N18.30

## 2019-01-08 NOTE — Progress Notes (Signed)
Location:  Occupational psychologist of Service:  SNF (31) Provider:   Cindi Carbon, ANP [place] [phone]  [name], DO  Patient Care Team: [name], DO as PCP - General (Geriatric Medicine)  Extended Emergency Contact Information Primary Emergency Contact: Alley,Kelly Address: [address] [place]          [place], [place] [address] [name] of [place] Phone: [phone] Relation: Daughter Secondary Emergency Contact: Remick,[name] Address: [address] [place] [address] [place] of [place] Phone: [phone] Relation: Son  Code Status:  DNR Goals of care: Advanced Directive information Advanced Directives 06/11/2018  Does Patient Have a Medical Advance Directive? Yes  Type of Paramedic of [place];Out of facility DNR (pink MOST or yellow form)  Does patient want to make changes to medical advance directive? No - Patient declined  Copy of [place] in Chart? Yes - validated most recent copy scanned in chart (See row information)  Pre-existing out of facility DNR order (yellow form or pink MOST form) Yellow form placed in chart (order not valid for inpatient use);Pink MOST form placed in chart (order not valid for inpatient use)     Chief Complaint  Patient presents with  . Medical Management of Chronic Issues    HPI:  Pt is a 78 y.o. male seen today for medical management of chronic diseases.   He has a hx of frontal dementia and depends on the staff for all ADls. He is resistance during personal are and can hit or kick of agitated. Most of the day he rest in his chair. He has periods of drowsiness followed by periods of alertness.  He has had a good appetite and his weight is trending upwards Wt Readings from Last 3 Encounters:  01/08/19 166 lb 9.6 oz (75.6 kg)  12/10/18 165 lb (74.8 kg)  11/04/18 162 lb 4.8 oz (73.6 kg)    The nurse reports some  times his pulse is in the 50's and his BP can be soft 35-465 range systolic. No sob, cp, or decreased 02 sats Bowels are moving normally   No s/s of pain are reported. Unfortunately he is not verbal and unable to contribute to the hx.   Past Medical History:  Diagnosis Date  . Dementia ([place])   . Diabetes ([place])   . Heart disease   . Hyperlipidemia   . Hypotension    Past Surgical History:  Procedure Laterality Date  . APPENDECTOMY  1961  . CORONARY ANGIOPLASTY WITH STENT PLACEMENT  2016  . TONSILLECTOMY AND ADENOIDECTOMY    . TOTAL KNEE ARTHROPLASTY  2012    Allergies  Allergen Reactions  . Lortab [Hydrocodone-Acetaminophen] Other (See Comments)    Reaction:  Affected pts cognition   . Morphine And Related Other (See Comments)    Reaction:  Affected pts cognition     Outpatient Encounter Medications as of 01/06/2019  Medication Sig  . acetaminophen (TYLENOL) 325 MG tablet Take 650 mg by mouth every 4 (four) hours as needed.  Marland Kitchen allopurinol (ZYLOPRIM) 100 MG tablet Take 1 tablet (100 mg total) by mouth daily.  . bisacodyl (DULCOLAX) 10 MG suppository Place 10 mg rectally daily as needed (constipation).   . carbamazepine (TEGRETOL) 200 MG tablet Take 200 mg by mouth every morning. 2 tablets (400 mg) at bedtime  . clopidogrel (PLAVIX) 75 MG tablet Take 1 tablet (75  mg total) by mouth daily.  Marland Kitchen gabapentin (NEURONTIN) 100 MG capsule Take 100 mg by mouth 2 (two) times daily.   Marland Kitchen GLUCERNA (GLUCERNA) LIQD Take 237 mLs by mouth daily.  . hydrOXYzine (VISTARIL) 25 MG capsule Take 25 mg by mouth at bedtime.  . polyethylene glycol (MIRALAX / GLYCOLAX) packet Take 17 g by mouth daily.  Marland Kitchen senna-docusate (SENOKOT-S) 8.6-50 MG tablet Take 1 tablet by mouth 2 (two) times daily.   No facility-administered encounter medications on file as of 01/06/2019.     Review of Systems  Unable to perform ROS: Dementia    Immunization History  Administered Date(s) Administered  . Influenza,inj,Quad  PF,6+ Mos 04/23/2018  . Influenza-Unspecified 05/05/2010, 04/11/2013, 03/03/2015, 09/06/2016, 05/10/2017  . Pneumococcal Polysaccharide-23 12/25/2010  . Pneumococcal-Unspecified 02/28/2013  . Td 06/01/2017  . Tdap 03/22/2006  . Zoster 11/01/2012  . Zoster Recombinat (Shingrix) 05/25/2017, 09/25/2017   Pertinent  Health Maintenance Due  Topic Date Due  . INFLUENZA VACCINE  02/01/2019  . PNA vac Low Risk Adult  Completed   Fall Risk  09/21/2018 05/29/2018 05/29/2017 04/05/2016  Falls in the past year? Exclusion - non ambulatory 0 No Yes  Number falls in past yr: - 0 - 1  Injury with Fall? - 0 - Yes   Functional Status Survey:    Vitals:   01/08/19 1210  Weight: 166 lb 9.6 oz (75.6 kg)   Body mass index is 27.72 kg/m. Physical Exam Constitutional:      General: He is not in acute distress.    Appearance: He is not diaphoretic.  HENT:     Head: Normocephalic and atraumatic.     Nose: Nose normal. No congestion.     Mouth/Throat:     Mouth: Mucous membranes are moist.     Pharynx: Oropharynx is clear.  Neck:     Thyroid: No thyromegaly.     Vascular: No JVD.     Trachea: No tracheal deviation.  Cardiovascular:     Rate and Rhythm: Regular rhythm. Bradycardia present.     Heart sounds: No murmur.  Pulmonary:     Effort: Pulmonary effort is normal. No respiratory distress.     Breath sounds: Normal breath sounds. No wheezing.  Abdominal:     General: Bowel sounds are normal. There is no distension.     Palpations: Abdomen is soft.     Tenderness: There is no abdominal tenderness.  Musculoskeletal:     Right lower leg: Edema (pedal with bluish discoloration not pitting ) present.     Left lower leg: Edema (pedal with bluish discoloration not pitting) present.  Lymphadenopathy:     Cervical: No cervical adenopathy.  Skin:    General: Skin is warm and dry.  Neurological:     General: No focal deficit present.     Mental Status: Mental status is at baseline.      Comments: Rigidity to BUE and BLE. Sleeping, arouses to physical stim. No verbal and not able to f/c     Labs reviewed: Recent Labs    05/17/18 0530 05/20/18  NA 140 141  K 4.6 4.4  BUN 23* 35*  CREATININE 0.9 1.1   Recent Labs    05/17/18 0530 05/20/18  AST 11* 9*  ALT 11 6*  ALKPHOS 106 92   Recent Labs    05/20/18  WBC 9.1  NEUTROABS 7  HGB 11.4*  HCT 33*  PLT 230   Lab Results  Component Value Date   TSH  0.77 10/31/2016   Lab Results  Component Value Date   HGBA1C 6.8 06/23/2017   Lab Results  Component Value Date   CHOL 124 10/26/2016   HDL 40 10/26/2016   LDLCALC 62 10/26/2016   TRIG 111 10/26/2016    Significant Diagnostic Results in last 30 days:  No results found.  Assessment/Plan 1. Venous insufficiency of both lower extremities Encourage elevation   2. Type 2 diabetes mellitus with neurological complications (HCC) Needs A1C Lab Results  Component Value Date   HGBA1C 6.8 06/23/2017    3. Diabetic peripheral neuropathy associated with type 2 diabetes mellitus (HCC) Continue neurontin 100 mg bid  4. Coronary artery disease of native artery of native heart with stable angina pectoris (HCC) Continue plavix 75 mg qd  5. 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 Continue supportive care in the skilled environment. Followed by Dr. Casimiro Needle for behavioral issues  6. Slow transit constipation Continue senokot s 1 tab bid and miralax 17 grams qd  7. CKD stage III CrCl 58.9 ml/min Continue to periodically monitor BMP and avoid nephrotoxic agents    Family/ staff Communication: staff  Labs/tests ordered:  TSH A1C tegretol level

## 2019-01-09 DIAGNOSIS — E039 Hypothyroidism, unspecified: Secondary | ICD-10-CM | POA: Diagnosis not present

## 2019-01-09 DIAGNOSIS — E1165 Type 2 diabetes mellitus with hyperglycemia: Secondary | ICD-10-CM | POA: Diagnosis not present

## 2019-01-09 DIAGNOSIS — Z79899 Other long term (current) drug therapy: Secondary | ICD-10-CM | POA: Diagnosis not present

## 2019-01-09 LAB — HEMOGLOBIN A1C: Hemoglobin A1C: 6.7

## 2019-01-09 LAB — TSH: TSH: 2.45 (ref 0.41–5.90)

## 2019-01-16 DIAGNOSIS — F0280 Dementia in other diseases classified elsewhere without behavioral disturbance: Secondary | ICD-10-CM | POA: Diagnosis not present

## 2019-01-16 DIAGNOSIS — F329 Major depressive disorder, single episode, unspecified: Secondary | ICD-10-CM | POA: Diagnosis not present

## 2019-02-03 ENCOUNTER — Encounter: Payer: Self-pay | Admitting: Adult Health

## 2019-02-03 ENCOUNTER — Non-Acute Institutional Stay (SKILLED_NURSING_FACILITY): Payer: PPO | Admitting: Adult Health

## 2019-02-03 DIAGNOSIS — G3109 Other frontotemporal dementia: Secondary | ICD-10-CM | POA: Diagnosis not present

## 2019-02-03 DIAGNOSIS — F0280 Dementia in other diseases classified elsewhere without behavioral disturbance: Secondary | ICD-10-CM | POA: Diagnosis not present

## 2019-02-03 DIAGNOSIS — E1142 Type 2 diabetes mellitus with diabetic polyneuropathy: Secondary | ICD-10-CM | POA: Diagnosis not present

## 2019-02-03 DIAGNOSIS — K5901 Slow transit constipation: Secondary | ICD-10-CM

## 2019-02-03 DIAGNOSIS — E790 Hyperuricemia without signs of inflammatory arthritis and tophaceous disease: Secondary | ICD-10-CM | POA: Diagnosis not present

## 2019-02-03 DIAGNOSIS — S99921A Unspecified injury of right foot, initial encounter: Secondary | ICD-10-CM | POA: Diagnosis not present

## 2019-02-03 DIAGNOSIS — E1149 Type 2 diabetes mellitus with other diabetic neurological complication: Secondary | ICD-10-CM

## 2019-02-03 NOTE — Progress Notes (Signed)
Location:  Occupational psychologist of Service:  SNF (31) Provider:   Cindi Carbon, ANP [place] [phone]   [name], DO  Patient Care Team: [name], DO as PCP - General (Geriatric Medicine)  Extended Emergency Contact Information Primary Emergency Contact: Jose Castillo,Jose Castillo Address: [address]          [place], [place] [address] [name] of [place] Phone: [phone] Relation: Daughter Secondary Emergency Contact: Jose Castillo,Jose Castillo Address: [address] [place] [address] [place] of [place] Phone: [phone] Relation: Son  Code Status:  DNR Goals of care: Advanced Directive information Advanced Directives 06/11/2018  Does Patient Have a Medical Advance Directive? Yes  Type of Paramedic of [place];Out of facility DNR (pink MOST or yellow form)  Does patient want to make changes to medical advance directive? No - Patient declined  Copy of [place] in Chart? Yes - validated most recent copy scanned in chart (See row information)  Pre-existing out of facility DNR order (yellow form or pink MOST form) Yellow form placed in chart (order not valid for inpatient use);Pink MOST form placed in chart (order not valid for inpatient use)     Chief Complaint  Patient presents with  . Medical Management of Chronic Issues    HPI:  Pt is a 78 y.o. male seen today for medical management of chronic diseases.   Frontotemporal dementia: more alert recently with periods of attempts to hit and kick the staff.  Neurontin was decreased last month and Jose Castillo appeared to have more pain in his feet and so the med was increased back to previous bid dosing.  Nurse reports he has a break to the right great toe, no drainage or redness No issues with joint pain or swelling on allopurinol .   Functional status: hoyer lift for transfers and incontinent    Past Medical  History:  Diagnosis Date  . Dementia ([place])   . Diabetes ([place])   . Heart disease   . Hyperlipidemia   . Hypotension    Past Surgical History:  Procedure Laterality Date  . APPENDECTOMY  1961  . CORONARY ANGIOPLASTY WITH STENT PLACEMENT  2016  . TONSILLECTOMY AND ADENOIDECTOMY    . TOTAL KNEE ARTHROPLASTY  2012    Allergies  Allergen Reactions  . Lortab [Hydrocodone-Acetaminophen] Other (See Comments)    Reaction:  Affected pts cognition   . Morphine And Related Other (See Comments)    Reaction:  Affected pts cognition     Outpatient Encounter Medications as of 02/03/2019  Medication Sig  . acetaminophen (TYLENOL) 325 MG tablet Take 650 mg by mouth every 4 (four) hours as needed.  Marland Kitchen allopurinol (ZYLOPRIM) 100 MG tablet Take 1 tablet (100 mg total) by mouth daily.  . bisacodyl (DULCOLAX) 10 MG suppository Place 10 mg rectally daily as needed (constipation).   . carbamazepine (TEGRETOL) 200 MG tablet Take 200 mg by mouth every morning. 2 tablets (400 mg) at bedtime  . clopidogrel (PLAVIX) 75 MG tablet Take 1 tablet (75 mg total) by mouth daily.  Marland Kitchen gabapentin (NEURONTIN) 100 MG capsule Take 100 mg by mouth 2 (two) times daily.   Marland Kitchen GLUCERNA (GLUCERNA) LIQD Take 237 mLs by mouth daily.  . hydrOXYzine (VISTARIL) 25 MG capsule Take 25 mg by mouth at bedtime.  . polyethylene glycol (MIRALAX / GLYCOLAX) packet Take 17 g by mouth daily.  Marland Kitchen  senna-docusate (SENOKOT-S) 8.6-50 MG tablet Take 1 tablet by mouth 2 (two) times daily.   No facility-administered encounter medications on file as of 02/03/2019.     Review of Systems  Unable to perform ROS: Dementia    Immunization History  Administered Date(s) Administered  . Influenza,inj,Quad PF,6+ Mos 04/23/2018  . Influenza-Unspecified 05/05/2010, 04/11/2013, 03/03/2015, 09/06/2016, 05/10/2017  . Pneumococcal Polysaccharide-23 12/25/2010  . Pneumococcal-Unspecified 02/28/2013  . Td 06/01/2017  . Tdap 03/22/2006  . Zoster 11/01/2012  .  Zoster Recombinat (Shingrix) 05/25/2017, 09/25/2017   Pertinent  Health Maintenance Due  Topic Date Due  . INFLUENZA VACCINE  02/01/2019  . PNA vac Low Risk Adult  Completed   Fall Risk  09/21/2018 05/29/2018 05/29/2017 04/05/2016  Falls in the past year? Exclusion - non ambulatory 0 No Yes  Number falls in past yr: - 0 - 1  Injury with Fall? - 0 - Yes   Functional Status Survey:    Vitals:   02/03/19 1137  Weight: 166 lb 9.6 oz (75.6 kg)   Body mass index is 27.72 kg/m. Physical Exam Vitals signs and nursing note reviewed.  Constitutional:      General: He is not in acute distress.    Appearance: He is not diaphoretic.  HENT:     Head: Normocephalic and atraumatic.  Neck:     Thyroid: No thyromegaly.     Vascular: No JVD.     Trachea: No tracheal deviation.  Cardiovascular:     Rate and Rhythm: Regular rhythm. Bradycardia present.     Heart sounds: No murmur.  Pulmonary:     Effort: Pulmonary effort is normal. No respiratory distress.     Breath sounds: Normal breath sounds. No wheezing.  Abdominal:     General: Bowel sounds are normal. There is no distension.     Palpations: Abdomen is soft.     Tenderness: There is no abdominal tenderness.  Musculoskeletal:     Right lower leg: Edema (pedal non pitting) present.     Left lower leg: Edema (pedal non pitting) present.  Lymphadenopathy:     Cervical: No cervical adenopathy.  Skin:    General: Skin is warm and dry.     Comments: Bluish discoloration to bilat feet with slow cap refill Right great toenail with small crack with a small amt of blood. No drainage or redness. Slightly tender.  Neurological:     General: No focal deficit present.     Mental Status: He is alert. Mental status is at baseline.  Psychiatric:     Comments: Attempts to swing at me      Labs reviewed: Recent Labs    05/17/18 0530 05/20/18  NA 140 141  K 4.6 4.4  BUN 23* 35*  CREATININE 0.9 1.1   Recent Labs    05/17/18 0530  05/20/18  AST 11* 9*  ALT 11 6*  ALKPHOS 106 92   Recent Labs    05/20/18  WBC 9.1  NEUTROABS 7  HGB 11.4*  HCT 33*  PLT 230   Lab Results  Component Value Date   TSH 2.45 01/09/2019   Lab Results  Component Value Date   HGBA1C 6.7 01/09/2019   Lab Results  Component Value Date   CHOL 124 10/26/2016   HDL 40 10/26/2016   LDLCALC 62 10/26/2016   TRIG 111 10/26/2016    Carb level 8.1 01/09/19  Significant Diagnostic Results in last 30 days:  No results found.  Assessment/Plan 1. Type 2 diabetes  mellitus with neurological complications [facility]) Lab Results  Component Value Date   HGBA1C 6.7 01/09/2019   Improved neuropathy pain, see below  2. Diabetic peripheral neuropathy associated with type 2 diabetes mellitus ([place]) Improvement in perceived pain with increase in neurontin back to 100 mg bid   3. Frontal dementia ([place]) Severe with assistance needed in all ADLs. Continues with periods of aggression and resistance to care. Followed by Dr. Casimiro Needle. Continue tegretol with periodic lab monitoring and vistaril qhs   4. Slow transit constipation Continue senna s 1 tab bid   5. Hyperuricemia Continue allopurinol 100 mg qd   6. Injury of toenail of right foot, initial encounter Polysporin to right great toe nail with band aid and monitor for infection. F/U with podiatry    Family/ staff Communication: staff  Labs/tests ordered:  NA

## 2019-02-04 DIAGNOSIS — E119 Type 2 diabetes mellitus without complications: Secondary | ICD-10-CM | POA: Diagnosis not present

## 2019-02-04 DIAGNOSIS — L602 Onychogryphosis: Secondary | ICD-10-CM | POA: Diagnosis not present

## 2019-02-04 DIAGNOSIS — M79672 Pain in left foot: Secondary | ICD-10-CM | POA: Diagnosis not present

## 2019-02-04 DIAGNOSIS — M79671 Pain in right foot: Secondary | ICD-10-CM | POA: Diagnosis not present

## 2019-03-11 ENCOUNTER — Encounter: Payer: Self-pay | Admitting: Internal Medicine

## 2019-03-11 ENCOUNTER — Non-Acute Institutional Stay (SKILLED_NURSING_FACILITY): Payer: PPO | Admitting: Internal Medicine

## 2019-03-11 DIAGNOSIS — I25118 Atherosclerotic heart disease of native coronary artery with other forms of angina pectoris: Secondary | ICD-10-CM | POA: Diagnosis not present

## 2019-03-11 DIAGNOSIS — G3109 Other frontotemporal dementia: Secondary | ICD-10-CM | POA: Diagnosis not present

## 2019-03-11 DIAGNOSIS — N183 Chronic kidney disease, stage 3 unspecified: Secondary | ICD-10-CM

## 2019-03-11 DIAGNOSIS — E1169 Type 2 diabetes mellitus with other specified complication: Secondary | ICD-10-CM | POA: Diagnosis not present

## 2019-03-11 DIAGNOSIS — E1142 Type 2 diabetes mellitus with diabetic polyneuropathy: Secondary | ICD-10-CM | POA: Diagnosis not present

## 2019-03-11 DIAGNOSIS — Z0000 Encounter for general adult medical examination without abnormal findings: Secondary | ICD-10-CM

## 2019-03-11 DIAGNOSIS — K5901 Slow transit constipation: Secondary | ICD-10-CM | POA: Diagnosis not present

## 2019-03-11 DIAGNOSIS — R441 Visual hallucinations: Secondary | ICD-10-CM

## 2019-03-11 DIAGNOSIS — F0280 Dementia in other diseases classified elsewhere without behavioral disturbance: Secondary | ICD-10-CM

## 2019-03-11 DIAGNOSIS — E785 Hyperlipidemia, unspecified: Secondary | ICD-10-CM | POA: Diagnosis not present

## 2019-03-11 LAB — HM DIABETES FOOT EXAM

## 2019-03-11 NOTE — Progress Notes (Signed)
Patient ID: Jose Castillo, male   DOB: September 18, 1940, 78 y.o.   MRN: [id number]  Provider:  Jonelle Sidle L. Mariea Clonts, D.O., C.M.D. Location: [facility] Room Number: [id number] Place of Service:  SNF (31)   PCP: [name], DO Patient Care Team: [name], DO as PCP - General (Geriatric Medicine)  Extended Emergency Contact Information Primary Emergency Contact: Alley,Kelly Address: [address]          [place], [place] [address] [name] of [place] Phone: [phone] Relation: Daughter Secondary Emergency Contact: Toscano,[name] Address: [address] [place] [address] [place] Phone: [phone] Relation: Son  Code Status: DNR, MOST Goals of Care: Advanced Directive information Advanced Directives 06/11/2018  Does Patient Have a Medical Advance Directive? Yes  Type of Paramedic of [place];Out of facility DNR (pink MOST or yellow form)  Does patient want to make changes to medical advance directive? No - Patient declined  Copy of [place] in Chart? Yes - validated most recent copy scanned in chart (See row information)  Pre-existing out of facility DNR order (yellow form or pink MOST form) Yellow form placed in chart (order not valid for inpatient use);Pink MOST form placed in chart (order not valid for inpatient use)     Chief Complaint  Patient presents with  . Annual Exam    CPE    HPI: Patient is a 78 y.o. male seen today for an annual comprehensive examination.  Jose Castillo is in SNF for long-term care after his physical debility became where he needed more physical assistance.  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  He also has a h/o DMII with neuropathy for which he's been on gabapentin, gout, CKD3, HTN, CAD, essential tremor, hyperlipidemia, urinary incontinence and bph.  When seen, nursing reported he's been having episodes where  he makes fists and groans "aahhh" loudly.  His body will tense up but he's awake and will respond, open his eyes and has PERRLA during the episodes.  He says things like "get out of my face" and "don't let them in" amid the episodes suggesting he's seeing something in front of him/hallucinating.  He's not had tongue bite or lost consciousness during the episodes.  He had been having more sleepiness in the days prior to this so gabapentin was stopped, but then he started to pull on his feet more and appeared uncomfortable so it was resumed.    He has a MOST form in place but it's not scanned in vynca.  He does have his DNR scanned.    Past Medical History:  Diagnosis Date  . Dementia ([place])   . Diabetes ([place])   . Heart disease   . Hyperlipidemia   . Hypotension    Past Surgical History:  Procedure Laterality Date  . APPENDECTOMY  1961  . CORONARY ANGIOPLASTY WITH STENT PLACEMENT  2016  . TONSILLECTOMY AND ADENOIDECTOMY    . TOTAL KNEE ARTHROPLASTY  2012    reports that he quit smoking about 17 years ago. His smoking use included cigarettes. He has a 10.00 pack-year smoking history. He has never used smokeless tobacco. He reports that he does not drink alcohol or use drugs. Social History   Socioeconomic History  . Marital status: Widowed    Spouse name: Not on file  . Number of children: 3  . Years of  education: MBA  . Highest education level: Not on file  Occupational History  . Occupation: N/A  Social Needs  . Financial resource strain: Not on file  . Food insecurity    Worry: Not on file    Inability: Not on file  . Transportation needs    Medical: Not on file    Non-medical: Not on file  Tobacco Use  . Smoking status: Former Smoker    Packs/day: 0.50    Years: 20.00    Pack years: 10.00    Types: Cigarettes    Quit date: 2003    Years since quitting: 17.6  . Smokeless tobacco: Never Used  Substance and Sexual Activity  . Alcohol use: No    Comment: Quit 2013  .  Drug use: No  . Sexual activity: Never  Lifestyle  . Physical activity    Days per week: Not on file    Minutes per session: Not on file  . Stress: Not on file  Relationships  . Social Herbalist on phone: Not on file    Gets together: Not on file    Attends religious service: Not on file    Active member of club or organization: Not on file    Attends meetings of clubs or organizations: Not on file    Relationship status: Not on file  Other Topics Concern  . Not on file  Social History Narrative   Right-handed      Do you drink/eat things with caffeine? occasional diet Coke      Marital status?  widowed                        What year were you married?  1966      Do you live in a house, apartment, assisted living, condo, trailer, etc.? [place]      Is it one or more stories? one      How many persons live in your home?      Do you have any pets in your home? (please list) none      Current or past profession: financial      Do you exercise?   yes                                   Type & how often? 1/day      Do you have a living will? yes      Do you have a DNR form?                                  If not, do you want to discuss one?      Do you have signed POA/HPOA for forms?    Family History  Problem Relation Age of Onset  . Cancer Mother 66       Lymphoma  . Heart attack Father 30  . Diabetes Sister   . Brain cancer Sister     Pertinent  Health Maintenance Due  Topic Date Due  . INFLUENZA VACCINE  02/01/2019  . PNA vac Low Risk Adult  Completed   Fall Risk  09/21/2018 05/29/2018 05/29/2017 04/05/2016  Falls in the past year? Exclusion - non ambulatory 0 No Yes  Number falls in past yr: - 0 - 1  Injury  with Fall? - 0 - Yes   Depression screen [facility] 2/9 05/29/2018 04/05/2016  Decreased Interest 0 0  Down, Depressed, Hopeless 0 0  PHQ - 2 Score 0 0    Functional Status Survey:    Allergies  Allergen Reactions  . Lortab  [Hydrocodone-Acetaminophen] Other (See Comments)    Reaction:  Affected pts cognition   . Morphine And Related Other (See Comments)    Reaction:  Affected pts cognition     Outpatient Encounter Medications as of 03/11/2019  Medication Sig  . allopurinol (ZYLOPRIM) 100 MG tablet Take 1 tablet (100 mg total) by mouth daily.  . bisacodyl (DULCOLAX) 10 MG suppository Place 10 mg rectally daily as needed (constipation).   . carbamazepine (TEGRETOL) 200 MG tablet Take 200 mg by mouth every morning. 2 tablets (400 mg) at bedtime  . clopidogrel (PLAVIX) 75 MG tablet Take 1 tablet (75 mg total) by mouth daily.  Marland Kitchen gabapentin (NEURONTIN) 100 MG capsule Take 100 mg by mouth 2 (two) times daily.   Marland Kitchen GLUCERNA (GLUCERNA) LIQD Take 237 mLs by mouth daily.  . hydrOXYzine (VISTARIL) 25 MG capsule Take 25 mg by mouth at bedtime.  . polyethylene glycol (MIRALAX / GLYCOLAX) packet Take 17 g by mouth daily.  Marland Kitchen senna-docusate (SENOKOT-S) 8.6-50 MG tablet Take 1 tablet by mouth 2 (two) times daily.  . [DISCONTINUED] acetaminophen (TYLENOL) 325 MG tablet Take 650 mg by mouth every 4 (four) hours as needed.   No facility-administered encounter medications on file as of 03/11/2019.     Review of Systems  Constitutional: Negative for activity change, appetite change, chills, fatigue and fever.  HENT: Positive for trouble swallowing. Negative for congestion.        On regular mechanical soft diet with thin liquids, needs to be fed  Eyes: Negative for visual disturbance.  Respiratory: Negative for chest tightness and shortness of breath.   Cardiovascular: Negative for chest pain, palpitations and leg swelling.  Gastrointestinal: Negative for abdominal pain, constipation, diarrhea, nausea and vomiting.  Genitourinary: Negative for dysuria.  Musculoskeletal: Positive for arthralgias and gait problem.       Uses manual wheelchair tilted back; hoyer lift for transfers; has had hip pain, bilateral knee pain and neuropathy   Skin: Negative for color change and pallor.  Neurological: Negative for dizziness.       Speaks very few words  Hematological: Does not bruise/bleed easily.  Psychiatric/Behavioral: Positive for agitation, behavioral problems, confusion and hallucinations. Negative for sleep disturbance. The patient is not nervous/anxious.     Vitals:   03/11/19 1107  BP: 135/71  Pulse: 65  Resp: 16  Temp: 97.6 F (36.4 C)  TempSrc: Oral  SpO2: 95%  Weight: 171 lb (77.6 kg)  Height: 5\' 5"  (1.651 m)   Body mass index is 28.46 kg/m. Physical Exam  Labs reviewed: Basic Metabolic Panel: Recent Labs    05/17/18 0530 05/20/18  NA 140 141  K 4.6 4.4  BUN 23* 35*  CREATININE 0.9 1.1   Liver Function Tests: Recent Labs    05/17/18 0530 05/20/18  AST 11* 9*  ALT 11 6*  ALKPHOS 106 92   No results for input(s): LIPASE, AMYLASE in the last 8760 hours. No results for input(s): AMMONIA in the last 8760 hours. CBC: Recent Labs    05/20/18  WBC 9.1  NEUTROABS 7  HGB 11.4*  HCT 33*  PLT 230   Cardiac Enzymes: No results for input(s): CKTOTAL, CKMB, CKMBINDEX, TROPONINI in the last 8760  hours. BNP: Invalid input(s): POCBNP Lab Results  Component Value Date   HGBA1C 6.7 01/09/2019   Lab Results  Component Value Date   TSH 2.45 01/09/2019   Lab Results  Component Value Date   VITAMINB12 2,000 10/24/2016   No results found for: FOLATE No results found for: IRON, TIBC, FERRITIN   Assessment/Plan 1. 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 Hallucinations, visual -not clear if he's had these before, but does seem to be seeing things at this time that lead to his episodes of clonic jerking and groaning -these episodes do not sound like seizures  3. Annual physical exam -performed today  4. Coronary artery disease of native artery of native heart with stable angina  pectoris ([place]) -remains on plavix due to stents, off other secondary preventive meds at this stage of his dementia due to lag time to benefit  5. Diabetic peripheral neuropathy associated with type 2 diabetes mellitus ([place]) -very well controlled -has glucerna as supplement -is back on his gabapentin due to his neuropathic pain Lab Results  Component Value Date   HGBA1C 6.7 01/09/2019    6. CKD (chronic kidney disease) stage 3, GFR 30-59 ml/min (HCC) -Avoid nephrotoxic agents like nsaids, dose adjust renally excreted meds, hydrate.  7. Hyperlipidemia associated with type 2 diabetes mellitus ([place]) -off statin due to lag time to benefit with his advanced dementia  8. Slow transit constipation -cont current bowel regimen which has been more effective   Family/ staff Communication: discussed with SNF nurse  Labs/tests ordered:   No new  Devanshi Califf L. Lanetra Hartley, D.O. [facility] [address], [place] [address] Cell Phone (Mon-Fri 8am-5pm):  [phone] On Call:  [phone] & follow prompts after 5pm & weekends Office Phone:  [phone] Office Fax:  [phone]

## 2019-04-16 ENCOUNTER — Encounter: Payer: Self-pay | Admitting: Adult Health

## 2019-04-16 ENCOUNTER — Non-Acute Institutional Stay (SKILLED_NURSING_FACILITY): Payer: PPO | Admitting: Adult Health

## 2019-04-16 DIAGNOSIS — E1142 Type 2 diabetes mellitus with diabetic polyneuropathy: Secondary | ICD-10-CM | POA: Diagnosis not present

## 2019-04-16 DIAGNOSIS — I25118 Atherosclerotic heart disease of native coronary artery with other forms of angina pectoris: Secondary | ICD-10-CM | POA: Diagnosis not present

## 2019-04-16 DIAGNOSIS — K5901 Slow transit constipation: Secondary | ICD-10-CM

## 2019-04-16 DIAGNOSIS — G3109 Other frontotemporal dementia: Secondary | ICD-10-CM | POA: Diagnosis not present

## 2019-04-16 DIAGNOSIS — F0280 Dementia in other diseases classified elsewhere without behavioral disturbance: Secondary | ICD-10-CM | POA: Diagnosis not present

## 2019-04-16 DIAGNOSIS — E790 Hyperuricemia without signs of inflammatory arthritis and tophaceous disease: Secondary | ICD-10-CM

## 2019-04-16 DIAGNOSIS — E1149 Type 2 diabetes mellitus with other diabetic neurological complication: Secondary | ICD-10-CM

## 2019-04-16 NOTE — Progress Notes (Signed)
Location:  Occupational psychologist of Service:  SNF (31) Provider:   Cindi Carbon, ANP [place] [phone]   [name], DO  Patient Care Team: [name], DO as PCP - General (Geriatric Medicine)  Extended Emergency Contact Information Primary Emergency Contact: Alley,Kelly Address: [address]          [place], [place] [address] [name] of [place] Phone: [phone] Relation: Daughter Secondary Emergency Contact: Blount,[name] Address: [address] [place] [address] [place] of [place] Phone: [phone] Relation: Son  Code Status:  DNR Goals of care: Advanced Directive information Advanced Directives 06/11/2018  Does Patient Have a Medical Advance Directive? Yes  Type of Paramedic of [place];Out of facility DNR (pink MOST or yellow form)  Does patient want to make changes to medical advance directive? No - Patient declined  Copy of [place] in Chart? Yes - validated most recent copy scanned in chart (See row information)  Pre-existing out of facility DNR order (yellow form or pink MOST form) Yellow form placed in chart (order not valid for inpatient use);Pink MOST form placed in chart (order not valid for inpatient use)     Chief Complaint  Patient presents with  . Medical Management of Chronic Issues    HPI:  Pt is a 78 y.o. male seen today for medical management of chronic diseases.   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 He is followed by Dr. Martina Sinner for behavioral concerns. The nurse reports he makes loud grunting noises at the nurses station. He is becoming more resistant to care and hits and kicks the staff at times. There is concern that he may talk to and see things that are not there. He is easily angered. They are requesting a prn medication for this issue.   He has DM II that is well controlled.   BP runs  low at times [id number] systolic.  Gout: remains on allopurinol with no bouts of joint pain or swelling.   Weight is trending upward. Eating well.  Wt Readings from Last 3 Encounters:  04/16/19 173 lb 12.8 oz (78.8 kg)  03/11/19 171 lb (77.6 kg)  02/03/19 166 lb 9.6 oz (75.6 kg)   Hx of CAD with stent in 2016 on plavix. No new events     Past Medical History:  Diagnosis Date  . Dementia ([place])   . Diabetes ([place])   . Heart disease   . Hyperlipidemia   . Hypotension    Past Surgical History:  Procedure Laterality Date  . APPENDECTOMY  1961  . CORONARY ANGIOPLASTY WITH STENT PLACEMENT  2016  . TONSILLECTOMY AND ADENOIDECTOMY    . TOTAL KNEE ARTHROPLASTY  2012    Allergies  Allergen Reactions  . Lortab [Hydrocodone-Acetaminophen] Other (See Comments)    Reaction:  Affected pts cognition   . Morphine And Related Other (See Comments)    Reaction:  Affected pts cognition     Outpatient Encounter Medications as of 04/16/2019  Medication Sig  . allopurinol (ZYLOPRIM) 100 MG tablet Take 1 tablet (100 mg total) by mouth daily.  . bisacodyl (DULCOLAX) 10 MG suppository Place 10 mg rectally daily as needed (constipation).   . carbamazepine (TEGRETOL) 200 MG tablet Take 200 mg by mouth every morning. 2 tablets (400 mg) at bedtime  . clopidogrel (PLAVIX) 75 MG tablet Take 1  tablet (75 mg total) by mouth daily.  Marland Kitchen gabapentin (NEURONTIN) 100 MG capsule Take 100 mg by mouth 2 (two) times daily.   Marland Kitchen GLUCERNA (GLUCERNA) LIQD Take 237 mLs by mouth daily.  . hydrOXYzine (VISTARIL) 25 MG capsule Take 25 mg by mouth at bedtime.  . polyethylene glycol (MIRALAX / GLYCOLAX) packet Take 17 g by mouth daily.  Marland Kitchen senna-docusate (SENOKOT-S) 8.6-50 MG tablet Take 1 tablet by mouth 2 (two) times daily.   No facility-administered encounter medications on file as of 04/16/2019.     Review of Systems  Unable to perform ROS: Dementia    Immunization History  Administered Date(s) Administered  .  Influenza,inj,Quad PF,6+ Mos 04/23/2018  . Influenza-Unspecified 05/05/2010, 04/11/2013, 03/03/2015, 09/06/2016, 05/10/2017  . Pneumococcal Polysaccharide-23 12/25/2010  . Pneumococcal-Unspecified 02/28/2013  . Td 06/01/2017  . Tdap 03/22/2006  . Zoster 11/01/2012  . Zoster Recombinat (Shingrix) 05/25/2017, 09/25/2017   Pertinent  Health Maintenance Due  Topic Date Due  . INFLUENZA VACCINE  02/01/2019  . PNA vac Low Risk Adult  Completed   Fall Risk  09/21/2018 05/29/2018 05/29/2017 04/05/2016  Falls in the past year? Exclusion - non ambulatory 0 No Yes  Number falls in past yr: - 0 - 1  Injury with Fall? - 0 - Yes   Functional Status Survey:    Vitals:   04/16/19 1105  BP: 109/69  Pulse: 63  Resp: (!) 21  Temp: (!) 97.2 F (36.2 C)  SpO2: 92%  Weight: 173 lb 12.8 oz (78.8 kg)   Body mass index is 28.92 kg/m. Physical Exam Vitals signs and nursing note reviewed.  Constitutional:      General: He is not in acute distress.    Appearance: He is not diaphoretic.     Comments: Asleep snoring loudly  HENT:     Head: Normocephalic and atraumatic.     Nose: Nose normal. No congestion.     Mouth/Throat:     Mouth: Mucous membranes are moist.     Pharynx: Oropharynx is clear. No oropharyngeal exudate.  Eyes:     Conjunctiva/sclera: Conjunctivae normal.     Pupils: Pupils are equal, round, and reactive to light.  Neck:     Thyroid: No thyromegaly.     Vascular: No JVD.     Trachea: No tracheal deviation.  Cardiovascular:     Rate and Rhythm: Normal rate and regular rhythm.     Pulses:          Dorsalis pedis pulses are 1+ on the right side and 1+ on the left side.       Posterior tibial pulses are 1+ on the right side and 1+ on the left side.     Heart sounds: No murmur.  Pulmonary:     Effort: Pulmonary effort is normal. No respiratory distress.     Breath sounds: Normal breath sounds. No wheezing.  Abdominal:     General: Bowel sounds are normal. There is no  distension.     Palpations: Abdomen is soft.     Tenderness: There is no abdominal tenderness.  Musculoskeletal:     Right lower leg: Edema (trace pedal) present.     Left lower leg: Edema (trace pedal) present.     Right foot: Normal range of motion. No deformity.     Left foot: Normal range of motion. No deformity.  Feet:     Right foot:     Skin integrity: Skin integrity normal.     Left foot:  Skin integrity: Skin integrity normal.  Lymphadenopathy:     Cervical: No cervical adenopathy.  Skin:    General: Skin is warm and dry.  Neurological:     General: No focal deficit present.     Mental Status: Mental status is at baseline.     Comments: Not verbal and not able to follow commands     Labs reviewed: Recent Labs    05/17/18 0530 05/20/18  NA 140 141  K 4.6 4.4  BUN 23* 35*  CREATININE 0.9 1.1   Recent Labs    05/17/18 0530 05/20/18  AST 11* 9*  ALT 11 6*  ALKPHOS 106 92   Recent Labs    05/20/18  WBC 9.1  NEUTROABS 7  HGB 11.4*  HCT 33*  PLT 230   Lab Results  Component Value Date   TSH 2.45 01/09/2019   Lab Results  Component Value Date   HGBA1C 6.7 01/09/2019   Lab Results  Component Value Date   CHOL 124 10/26/2016   HDL 40 10/26/2016   LDLCALC 62 10/26/2016   TRIG 111 10/26/2016    Significant Diagnostic Results in last 30 days:  No results found.  Assessment/Plan 1. 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 Please let Dr. Casimiro Needle know at his next visit.  Vistaril 25 mg bid prn agitation x 14 days  Needs tegretol level q 6 months, due in Dec  2. Type 2 diabetes mellitus with neurological complications [facility]) Lab Results  Component Value Date   HGBA1C 6.7 01/09/2019  Diet controlled Continue A1C monitoring q 6 months.   3. Diabetic peripheral neuropathy associated with type 2 diabetes mellitus (HCC) No signs of pain on exam, difficult to assess due to his severe dementia Continue neurontin 100 mg bid  4. Coronary  artery disease of native artery of native heart with stable angina pectoris (HCC) Hx of stent, continue plavix 75 mg qd   5. Hyperuricemia No new bouts of gout, continue allopurinol 100 mg qd   6. Slow transit constipation Continue senokot s 1 tab bid and miralax 17 grams qd      Family/ staff Communication: staff  Labs/tests ordered:  NA

## 2019-05-05 DIAGNOSIS — Z20828 Contact with and (suspected) exposure to other viral communicable diseases: Secondary | ICD-10-CM | POA: Diagnosis not present

## 2019-05-05 DIAGNOSIS — Z9189 Other specified personal risk factors, not elsewhere classified: Secondary | ICD-10-CM | POA: Diagnosis not present

## 2019-05-08 ENCOUNTER — Non-Acute Institutional Stay (SKILLED_NURSING_FACILITY): Payer: PPO | Admitting: Adult Health

## 2019-05-08 ENCOUNTER — Encounter: Payer: Self-pay | Admitting: Adult Health

## 2019-05-08 DIAGNOSIS — F0280 Dementia in other diseases classified elsewhere without behavioral disturbance: Secondary | ICD-10-CM | POA: Diagnosis not present

## 2019-05-08 DIAGNOSIS — E1149 Type 2 diabetes mellitus with other diabetic neurological complication: Secondary | ICD-10-CM

## 2019-05-08 DIAGNOSIS — E1142 Type 2 diabetes mellitus with diabetic polyneuropathy: Secondary | ICD-10-CM | POA: Diagnosis not present

## 2019-05-08 DIAGNOSIS — N400 Enlarged prostate without lower urinary tract symptoms: Secondary | ICD-10-CM

## 2019-05-08 DIAGNOSIS — N1832 Chronic kidney disease, stage 3b: Secondary | ICD-10-CM | POA: Diagnosis not present

## 2019-05-08 DIAGNOSIS — G3109 Other frontotemporal dementia: Secondary | ICD-10-CM | POA: Diagnosis not present

## 2019-05-08 DIAGNOSIS — I119 Hypertensive heart disease without heart failure: Secondary | ICD-10-CM

## 2019-05-08 LAB — HM DIABETES FOOT EXAM

## 2019-05-08 NOTE — Progress Notes (Signed)
Location:  Occupational psychologist of Service:  SNF (31) Provider:   Cindi Carbon, ANP [place] [phone]   [name], DO  Patient Care Team: [name], DO as PCP - General (Geriatric Medicine)  Extended Emergency Contact Information Primary Emergency Contact: Alley,Kelly Address: [address]          [place], [place] [address] [name] of [place] Phone: [phone] Relation: Daughter Secondary Emergency Contact: Lezama,[name] Address: [address] [place] [address] [place] of [place] Phone: [phone] Relation: Son  Code Status:  DNR Goals of care: Advanced Directive information Advanced Directives 06/11/2018  Does Patient Have a Medical Advance Directive? Yes  Type of Paramedic of [place];Out of facility DNR (pink MOST or yellow form)  Does patient want to make changes to medical advance directive? No - Patient declined  Copy of [place] in Chart? Yes - validated most recent copy scanned in chart (See row information)  Pre-existing out of facility DNR order (yellow form or pink MOST form) Yellow form placed in chart (order not valid for inpatient use);Pink MOST form placed in chart (order not valid for inpatient use)     Chief Complaint  Patient presents with  . Medical Management of Chronic Issues    HPI:  Pt is a 78 y.o. male seen today for medical management of chronic diseases.   He has a hx of severe fronto temporal dementia. He is minimally verbal and not ambulatory requiring assistance with all ADLs. Recently he has begun grunting and moaning regularly. No clear cause or correlation has been noted by the staff. He resists care, hitting and kicking during personal care such as baths. He is followed by Dr. Casimiro Needle for this reason.   No issues are reported in regards to his appetite. His weight has been trending upward Wt Readings from  Last 3 Encounters:  05/08/19 174 lb 11.2 oz (79.2 kg)  04/16/19 173 lb 12.8 oz (78.8 kg)  03/11/19 171 lb (77.6 kg)   Neuropathic pain: not feet with prior hx of diabetes. No reports of pain.  Lab Results  Component Value Date   HGBA1C 6.7 01/09/2019  Diet controlled DM II. Takes glucerna 1 can daily. Tolerating mechanical soft diet with no issues coughing or choking.    Past Medical History:  Diagnosis Date  . Dementia ([place])   . Diabetes ([facility])   . Heart disease   . Hyperlipidemia   . Hypotension    Past Surgical History:  Procedure Laterality Date  . APPENDECTOMY  1961  . CORONARY ANGIOPLASTY WITH STENT PLACEMENT  2016  . TONSILLECTOMY AND ADENOIDECTOMY    . TOTAL KNEE ARTHROPLASTY  2012    Allergies  Allergen Reactions  . Lortab [Hydrocodone-Acetaminophen] Other (See Comments)    Reaction:  Affected pts cognition   . Morphine And Related Other (See Comments)    Reaction:  Affected pts cognition     Outpatient Encounter Medications as of 05/08/2019  Medication Sig  . allopurinol (ZYLOPRIM) 100 MG tablet Take 1 tablet (100 mg total) by mouth daily.  . bisacodyl (DULCOLAX) 10 MG suppository Place 10 mg rectally daily as needed (constipation).   . carbamazepine (TEGRETOL) 200 MG tablet Take 200 mg by mouth every morning. 2 tablets (400 mg) at bedtime  . clopidogrel (PLAVIX) 75 MG tablet Take 1 tablet (75 mg total) by mouth daily.  Marland Kitchen  gabapentin (NEURONTIN) 100 MG capsule Take 100 mg by mouth 2 (two) times daily.   Marland Kitchen GLUCERNA (GLUCERNA) LIQD Take 237 mLs by mouth daily.  . hydrOXYzine (VISTARIL) 25 MG capsule Take 25 mg by mouth at bedtime.  . polyethylene glycol (MIRALAX / GLYCOLAX) packet Take 17 g by mouth daily.  Marland Kitchen senna-docusate (SENOKOT-S) 8.6-50 MG tablet Take 1 tablet by mouth 2 (two) times daily.   No facility-administered encounter medications on file as of 05/08/2019.     Review of Systems  Unable to perform ROS: Dementia    Immunization History   Administered Date(s) Administered  . Influenza, High Dose Seasonal PF 04/17/2019  . Influenza,inj,Quad PF,6+ Mos 04/23/2018  . Influenza-Unspecified 05/05/2010, 04/11/2013, 03/03/2015, 09/06/2016, 05/10/2017  . Pneumococcal Polysaccharide-23 12/25/2010  . Pneumococcal-Unspecified 02/28/2013  . Td 06/01/2017  . Tdap 03/22/2006  . Zoster 11/01/2012  . Zoster Recombinat (Shingrix) 05/25/2017, 09/25/2017   Pertinent  Health Maintenance Due  Topic Date Due  . INFLUENZA VACCINE  Completed  . PNA vac Low Risk Adult  Completed   Fall Risk  09/21/2018 05/29/2018 05/29/2017 04/05/2016  Falls in the past year? Exclusion - non ambulatory 0 No Yes  Number falls in past yr: - 0 - 1  Injury with Fall? - 0 - Yes   Functional Status Survey:    Vitals:   05/08/19 1354  Weight: 174 lb 11.2 oz (79.2 kg)   Body mass index is 29.07 kg/m. Physical Exam Vitals signs and nursing note reviewed.  Constitutional:      General: He is not in acute distress.    Appearance: He is not diaphoretic.  HENT:     Head: Normocephalic and atraumatic.  Neck:     Thyroid: No thyromegaly.     Vascular: No JVD.     Trachea: No tracheal deviation.  Cardiovascular:     Rate and Rhythm: Normal rate and regular rhythm.     Pulses:          Dorsalis pedis pulses are 2+ on the right side and 2+ on the left side.       Posterior tibial pulses are 2+ on the right side and 2+ on the left side.     Heart sounds: No murmur.  Pulmonary:     Effort: Pulmonary effort is normal. No respiratory distress.     Breath sounds: Normal breath sounds. No wheezing.  Abdominal:     General: Bowel sounds are normal. There is no distension.     Palpations: Abdomen is soft.     Tenderness: There is no abdominal tenderness.  Musculoskeletal:     Right lower leg: No edema.     Left lower leg: No edema.  Lymphadenopathy:     Cervical: No cervical adenopathy.  Skin:    General: Skin is warm and dry.  Neurological:     Mental  Status: He is alert.     Cranial Nerves: No cranial nerve deficit.     Comments: Minimally verbal. Not able to f/c.  Alert and tracks with eyes. Moans and grunts. Resist range of motion exam and appears rigid.      Labs reviewed: Recent Labs    05/17/18 0530 05/20/18  NA 140 141  K 4.6 4.4  BUN 23* 35*  CREATININE 0.9 1.1   Recent Labs    05/17/18 0530 05/20/18  AST 11* 9*  ALT 11 6*  ALKPHOS 106 92   Recent Labs    05/20/18  WBC 9.1  NEUTROABS  7  HGB 11.4*  HCT 33*  PLT 230   Lab Results  Component Value Date   TSH 2.45 01/09/2019   Lab Results  Component Value Date   HGBA1C 6.7 01/09/2019   Lab Results  Component Value Date   CHOL 124 10/26/2016   HDL 40 10/26/2016   LDLCALC 62 10/26/2016   TRIG 111 10/26/2016    Significant Diagnostic Results in last 30 days:  No results found.  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 Continues on Tegretol and Vistaril for behavioral issues. Has continued issues with resistance with personal care that may not improve easily with medications. He is due for labs so will add Tegretol level and Dr. Casimiro Needle can follow up later this month. No clear reason for his grunting other than the advancement in his dementia (no pain or aggravating factors)  2. Stage 3b chronic kidney disease Continue to periodically monitor BMP and avoid nephrotoxic agents  3. BPH without obstruction/lower urinary tract symptoms No symptoms, incontinent Not currently on medications  4. Type 2 diabetes mellitus with neurological complications (HCC) Controlled without medications Lab Results  Component Value Date   HGBA1C 6.7 01/09/2019    5. Diabetic peripheral neuropathy associated with type 2 diabetes mellitus (HCC) Controlled, continue neurontin 100 mg bid. Improved edema in feet today.  6. Benign hypertensive heart disease without heart failure Controlled without medications for bp. Continues on plavix due to hx  of CAD with prior stent.     Family/ staff Communication: discussed with his nurse  Labs/tests ordered:  CMP CBC tegretol level 11/9

## 2019-05-12 DIAGNOSIS — Z79899 Other long term (current) drug therapy: Secondary | ICD-10-CM | POA: Diagnosis not present

## 2019-05-12 DIAGNOSIS — D649 Anemia, unspecified: Secondary | ICD-10-CM | POA: Diagnosis not present

## 2019-05-12 LAB — CBC AND DIFFERENTIAL
HCT: 41 (ref 41–53)
Hemoglobin: 13.2 — AB (ref 13.5–17.5)
Neutrophils Absolute: 6
Platelets: 220 (ref 150–399)
WBC: 8

## 2019-05-12 LAB — BASIC METABOLIC PANEL
BUN: 26 — AB (ref 4–21)
CO2: 27 — AB (ref 13–22)
Chloride: 102 (ref 99–108)
Creatinine: 1 (ref 0.6–1.3)
Glucose: 174
Potassium: 5 (ref 3.4–5.3)
Sodium: 140 (ref 137–147)

## 2019-05-12 LAB — HEPATIC FUNCTION PANEL
ALT: 14 (ref 10–40)
AST: 14 (ref 14–40)
Alkaline Phosphatase: 122 (ref 25–125)
Bilirubin, Total: 0.2

## 2019-05-12 LAB — CBC: RBC: 4.33 (ref 3.87–5.11)

## 2019-05-12 LAB — COMPREHENSIVE METABOLIC PANEL
Albumin: 4.2 (ref 3.5–5.0)
Calcium: 9.1 (ref 8.7–10.7)
Globulin: 2.5

## 2019-05-27 DIAGNOSIS — Z9189 Other specified personal risk factors, not elsewhere classified: Secondary | ICD-10-CM | POA: Diagnosis not present

## 2019-06-03 ENCOUNTER — Non-Acute Institutional Stay (SKILLED_NURSING_FACILITY): Payer: PPO | Admitting: Internal Medicine

## 2019-06-03 ENCOUNTER — Encounter: Payer: Self-pay | Admitting: Internal Medicine

## 2019-06-03 DIAGNOSIS — E790 Hyperuricemia without signs of inflammatory arthritis and tophaceous disease: Secondary | ICD-10-CM

## 2019-06-03 DIAGNOSIS — I25118 Atherosclerotic heart disease of native coronary artery with other forms of angina pectoris: Secondary | ICD-10-CM

## 2019-06-03 DIAGNOSIS — F0280 Dementia in other diseases classified elsewhere without behavioral disturbance: Secondary | ICD-10-CM

## 2019-06-03 DIAGNOSIS — E1142 Type 2 diabetes mellitus with diabetic polyneuropathy: Secondary | ICD-10-CM | POA: Diagnosis not present

## 2019-06-03 DIAGNOSIS — N1832 Chronic kidney disease, stage 3b: Secondary | ICD-10-CM | POA: Diagnosis not present

## 2019-06-03 DIAGNOSIS — G3109 Other frontotemporal dementia: Secondary | ICD-10-CM

## 2019-06-03 DIAGNOSIS — R1312 Dysphagia, oropharyngeal phase: Secondary | ICD-10-CM | POA: Diagnosis not present

## 2019-06-03 DIAGNOSIS — M170 Bilateral primary osteoarthritis of knee: Secondary | ICD-10-CM | POA: Diagnosis not present

## 2019-06-03 NOTE — Progress Notes (Signed)
Location:  [facility] Room Number: 114 Place of Service:  SNF (31) Provider:  Lachandra Dettmann L. Mariea Clonts, D.O., C.M.D.  [name], DO  Patient Care Team: [name], DO as PCP - General (Geriatric Medicine)  Extended Emergency Contact Information Primary Emergency Contact: Alley,Kelly Address: [address]          [place], [place] [address] [name] of [place] Phone: [phone] Relation: Daughter Secondary Emergency Contact: Fernando,[name] Address: [address] [place] [address] [place] of [place] Phone: [phone] Relation: Son  Code Status:  DNR, MOST Goals of care: Advanced Directive information Advanced Directives 05/08/2019  Does Patient Have a Medical Advance Directive? No;Yes  Type of Paramedic of [place];Out of facility DNR (pink MOST or yellow form);Living will  Does patient want to make changes to medical advance directive? No - Patient declined  Copy of [place] in Chart? -  Would patient like information on creating a medical advance directive? No - Patient declined  Pre-existing out of facility DNR order (yellow form or pink MOST form) Yellow form placed in chart (order not valid for inpatient use);Pink MOST form placed in chart (order not valid for inpatient use);Pink MOST/Yellow Form most recent copy in chart - Physician notified to receive inpatient order     Chief Complaint  Patient presents with  . Medical Management of Chronic Issues    advanced frontotemporal dementia, dysphagia, osteoarthritis, diabetic neuropathy, CAD    HPI:  Pt is a 78 y.o. male seen today for medical management of chronic diseases.  He is in snf for full adl support due to end stages of dementia.  He is transferred via hoyer lift.  [name] has not had any recent changes.  He continues to be more sedate in mornings spending much of them resting in his [place] chair.  He  gets more alert in the afternoons and begins to move around in his chair--rocking back and forth.  He will moan and speak a few words.  He remains on a mechanical soft regular diet with thin liquids, all foods chopped and there are special approaches to feeding him for the CNAs.     Past Medical History:  Diagnosis Date  . Dementia ([facility])   . Diabetes ([facility])   . Heart disease   . Hyperlipidemia   . Hypotension    Past Surgical History:  Procedure Laterality Date  . APPENDECTOMY  1961  . CORONARY ANGIOPLASTY WITH STENT PLACEMENT  2016  . TONSILLECTOMY AND ADENOIDECTOMY    . TOTAL KNEE ARTHROPLASTY  2012    Allergies  Allergen Reactions  . Lortab [Hydrocodone-Acetaminophen] Other (See Comments)    Reaction:  Affected pts cognition   . Morphine And Related Other (See Comments)    Reaction:  Affected pts cognition     Outpatient Encounter Medications as of 06/03/2019  Medication Sig  . allopurinol (ZYLOPRIM) 100 MG tablet Take 1 tablet (100 mg total) by mouth daily.  . bisacodyl (DULCOLAX) 10 MG suppository Place 10 mg rectally daily as needed (constipation).   . carbamazepine (TEGRETOL) 200 MG tablet Take 200 mg by mouth every morning. 2 tablets (400 mg) at bedtime  . clopidogrel (PLAVIX) 75 MG tablet Take 1 tablet (75 mg total) by mouth daily.  Marland Kitchen gabapentin (NEURONTIN) 100 MG capsule Take 100 mg by mouth 2 (two) times daily.   Marland Kitchen GLUCERNA ([place]) LIQD Take  237 mLs by mouth daily.  . hydrOXYzine (VISTARIL) 25 MG capsule Take 25 mg by mouth at bedtime.  . polyethylene glycol (MIRALAX / GLYCOLAX) packet Take 17 g by mouth daily.  Marland Kitchen senna-docusate (SENOKOT-S) 8.6-50 MG tablet Take 1 tablet by mouth 2 (two) times daily.   No facility-administered encounter medications on file as of 06/03/2019.     Review of Systems  Constitutional: Negative for chills, fever and malaise/fatigue.  HENT: Negative for congestion.   Eyes: Negative for blurred vision.  Respiratory: Negative for  cough and shortness of breath.   Cardiovascular: Negative for chest pain, palpitations and leg swelling.  Gastrointestinal: Positive for constipation. Negative for abdominal pain, diarrhea, nausea and vomiting.  Genitourinary: Negative for dysuria.       Incontinent  Musculoskeletal: Negative for falls and joint pain.  Skin: Negative for itching and rash.  Neurological: Positive for tingling, tremors and sensory change. Negative for dizziness and loss of consciousness.  Endo/Heme/Allergies: Bruises/bleeds easily.  Psychiatric/Behavioral: Positive for memory loss. Negative for depression. The patient is not nervous/anxious and does not have insomnia.     Immunization History  Administered Date(s) Administered  . Influenza, High Dose Seasonal PF 04/17/2019  . Influenza,inj,Quad PF,6+ Mos 04/23/2018  . Influenza-Unspecified 05/05/2010, 04/11/2013, 03/03/2015, 09/06/2016, 05/10/2017  . Pneumococcal Polysaccharide-23 12/25/2010  . Pneumococcal-Unspecified 02/28/2013  . Td 06/01/2017  . Tdap 03/22/2006  . Zoster 11/01/2012  . Zoster Recombinat (Shingrix) 05/25/2017, 09/25/2017   Pertinent  Health Maintenance Due  Topic Date Due  . INFLUENZA VACCINE  Completed  . PNA vac Low Risk Adult  Completed   Fall Risk  05/08/2019 09/21/2018 05/29/2018 05/29/2017 04/05/2016  Falls in the past year? 1 Exclusion - non ambulatory 0 No Yes  Number falls in past yr: 1 - 0 - 1  Injury with Fall? 1 - 0 - Yes  Risk for fall due to : History of fall(s);Impaired balance/gait - - - -  Follow up Falls evaluation completed - - - -   Functional Status Survey:  dependent in all ADLs, uses manual wheelchair, hoyer lift, lives in snf  Vitals:   06/03/19 1028  BP: 118/80  Pulse: 68  Resp: (!) 23  Temp: 98.4 F (36.9 C)  SpO2: 94%  Weight: 174 lb 11.2 oz (79.2 kg)   Body mass index is 29.07 kg/m. Physical Exam Vitals reviewed.  Constitutional:      General: He is not in acute distress.    Appearance:  He is not ill-appearing or toxic-appearing.  HENT:     Head: Normocephalic and atraumatic.     Nose: No congestion.  Eyes:     Conjunctiva/sclera: Conjunctivae normal.     Pupils: Pupils are equal, round, and reactive to light.  Cardiovascular:     Rate and Rhythm: Normal rate.     Pulses: Normal pulses.     Heart sounds: Normal heart sounds.  Pulmonary:     Effort: Pulmonary effort is normal.     Breath sounds: Normal breath sounds. No wheezing, rhonchi or rales.  Abdominal:     General: Bowel sounds are normal.     Tenderness: There is no abdominal tenderness. There is no guarding or rebound.  Neurological:     Mental Status: He is alert. Mental status is at baseline.     Comments: Tremor of UEs; sitting in [place] chair tilted back, grabbing his left foot with his right hand and groaning and unintelligible speech  Psychiatric:  Mood and Affect: Mood normal.     Labs reviewed: No results for input(s): NA, K, CL, CO2, GLUCOSE, BUN, CREATININE, CALCIUM, MG, PHOS in the last 8760 hours. No results for input(s): AST, ALT, ALKPHOS, BILITOT, PROT, ALBUMIN in the last 8760 hours. No results for input(s): WBC, NEUTROABS, HGB, HCT, MCV, PLT in the last 8760 hours. Lab Results  Component Value Date   TSH 2.45 01/09/2019   Lab Results  Component Value Date   HGBA1C 6.7 01/09/2019   Lab Results  Component Value Date   CHOL 124 10/26/2016   HDL 40 10/26/2016   LDLCALC 62 10/26/2016   TRIG 111 10/26/2016    Assessment/Plan 1. Diabetic peripheral neuropathy associated with type 2 diabetes mellitus ([place]) -has been well-controlled, intake remains good when awake and with assistance -continue gabapentin   2. Coronary artery disease of native artery of native heart with stable angina pectoris ([place]) -no recent problems from this standpoint  3. Frontal dementia ([facility] [place]) -end stages, minimal unintelligible speech (except said "what's up" literally to me today), moves around in  [place] chair, dependent in all adls with dysphagia  -cont snf support -weight stable  4. Bilateral primary osteoarthritis of knee -off tylenol for pain now, monitor  5. Stage 3b chronic kidney disease -Avoid nephrotoxic agents like nsaids, dose adjust renally excreted meds, hydrate.  6. Hyperuricemia -continues on allopurinol for gout and elevated uric acid  7. Oropharyngeal dysphagia -cont modified diet, feeding and supplement shakes, aspiration precautions  Family/ staff Communication: discussed with snf nurse  Labs/tests ordered:  No new  Okla Qazi L. Kennetha Pearman, D.O. [facility] [address], [place] [address] Cell Phone (Mon-Fri 8am-5pm):  [phone] On Call:  [phone] & follow prompts after 5pm & weekends Office Phone:  [phone] Office Fax:  [phone]

## 2019-06-12 DIAGNOSIS — R1310 Dysphagia, unspecified: Secondary | ICD-10-CM | POA: Insufficient documentation

## 2019-06-12 HISTORY — DX: Dysphagia, unspecified: R13.10

## 2019-06-23 DIAGNOSIS — Z20828 Contact with and (suspected) exposure to other viral communicable diseases: Secondary | ICD-10-CM | POA: Diagnosis not present

## 2019-06-23 DIAGNOSIS — Z9189 Other specified personal risk factors, not elsewhere classified: Secondary | ICD-10-CM | POA: Diagnosis not present

## 2019-06-30 DIAGNOSIS — Z20828 Contact with and (suspected) exposure to other viral communicable diseases: Secondary | ICD-10-CM | POA: Diagnosis not present

## 2019-06-30 DIAGNOSIS — Z9189 Other specified personal risk factors, not elsewhere classified: Secondary | ICD-10-CM | POA: Diagnosis not present

## 2019-06-30 LAB — NOVEL CORONAVIRUS, NAA: SARS-CoV-2, NAA: NOT DETECTED

## 2019-07-06 DIAGNOSIS — Z9189 Other specified personal risk factors, not elsewhere classified: Secondary | ICD-10-CM | POA: Diagnosis not present

## 2019-07-06 DIAGNOSIS — Z20828 Contact with and (suspected) exposure to other viral communicable diseases: Secondary | ICD-10-CM | POA: Diagnosis not present

## 2019-07-07 ENCOUNTER — Encounter: Payer: Self-pay | Admitting: Adult Health

## 2019-07-07 ENCOUNTER — Non-Acute Institutional Stay (SKILLED_NURSING_FACILITY): Payer: PPO | Admitting: Adult Health

## 2019-07-07 DIAGNOSIS — I25118 Atherosclerotic heart disease of native coronary artery with other forms of angina pectoris: Secondary | ICD-10-CM

## 2019-07-07 DIAGNOSIS — E1149 Type 2 diabetes mellitus with other diabetic neurological complication: Secondary | ICD-10-CM

## 2019-07-07 DIAGNOSIS — E790 Hyperuricemia without signs of inflammatory arthritis and tophaceous disease: Secondary | ICD-10-CM | POA: Diagnosis not present

## 2019-07-07 DIAGNOSIS — F0280 Dementia in other diseases classified elsewhere without behavioral disturbance: Secondary | ICD-10-CM | POA: Diagnosis not present

## 2019-07-07 DIAGNOSIS — G3109 Other frontotemporal dementia: Secondary | ICD-10-CM

## 2019-07-07 DIAGNOSIS — E1142 Type 2 diabetes mellitus with diabetic polyneuropathy: Secondary | ICD-10-CM

## 2019-07-07 DIAGNOSIS — G250 Essential tremor: Secondary | ICD-10-CM | POA: Diagnosis not present

## 2019-07-08 NOTE — Progress Notes (Signed)
Location:  Occupational psychologist of Service:  SNF (31) Provider:   Cindi Carbon, ANP [facility] [phone]   [name], DO  Patient Care Team: [name], DO as PCP - General (Geriatric Medicine)  Extended Emergency Contact Information Primary Emergency Contact: Alley,Kelly Address: [address]          [place], [place] [address] [name] of [place] Phone: [phone] Relation: Daughter Secondary Emergency Contact: Pacella,[name] Address: [address] [place] [address] [place] of [place] Phone: [phone] Relation: Son  Code Status:  DNR Goals of care: Advanced Directive information Advanced Directives 05/08/2019  Does Patient Have a Medical Advance Directive? No;Yes  Type of Paramedic of [place];Out of facility DNR (pink MOST or yellow form);Living will  Does patient want to make changes to medical advance directive? No - Patient declined  Copy of [place] in Chart? -  Would patient like information on creating a medical advance directive? No - Patient declined  Pre-existing out of facility DNR order (yellow form or pink MOST form) Yellow form placed in chart (order not valid for inpatient use);Pink MOST form placed in chart (order not valid for inpatient use);Pink MOST/Yellow Form most recent copy in chart - Physician notified to receive inpatient order     Chief Complaint  Patient presents with  . Medical Management of Chronic Issues    HPI:  Pt is a 79 y.o. male seen today for medical management of chronic diseases.    Frontal dementia: has periods of alertness with agitation and resistance to care and periods of lethargy. Continues to eat well and is gaining weight.   No issues with cough, fever, or sob.   Wt Readings from Last 3 Encounters:  07/08/19 181 lb 4.8 oz (82.2 kg)  06/03/19 174 lb 11.2 oz (79.2 kg)  05/08/19 174 lb 11.2 oz  (79.2 kg)   Gout: currently on allopurinol with no acute bouts  DM [id number] controlled Lab Results  Component Value Date   HGBA1C 6.7 01/09/2019    Diabetic neuropathy: no reports of pain and no foot wounds  Past Medical History:  Diagnosis Date  . Dementia ([facility])   . Diabetes ([facility])   . Heart disease   . Hyperlipidemia   . Hypotension    Past Surgical History:  Procedure Laterality Date  . APPENDECTOMY  1961  . CORONARY ANGIOPLASTY WITH STENT PLACEMENT  2016  . TONSILLECTOMY AND ADENOIDECTOMY    . TOTAL KNEE ARTHROPLASTY  2012    Allergies  Allergen Reactions  . Lortab [Hydrocodone-Acetaminophen] Other (See Comments)    Reaction:  Affected pts cognition   . Morphine And Related Other (See Comments)    Reaction:  Affected pts cognition     Outpatient Encounter Medications as of 07/07/2019  Medication Sig  . allopurinol (ZYLOPRIM) 100 MG tablet Take 1 tablet (100 mg total) by mouth daily.  . bisacodyl (DULCOLAX) 10 MG suppository Place 10 mg rectally daily as needed (constipation).   . carbamazepine (TEGRETOL) 200 MG tablet Take 200 mg by mouth every morning. 2 tablets (400 mg) at bedtime  . clopidogrel (PLAVIX) 75 MG tablet Take 1 tablet (75 mg total) by mouth daily.  Marland Kitchen gabapentin (NEURONTIN) 100 MG capsule Take 100 mg by mouth 2 (two) times daily.   Marland Kitchen GLUCERNA (GLUCERNA) LIQD Take 237 mLs by mouth daily.  . hydrOXYzine (VISTARIL)  25 MG capsule Take 25 mg by mouth at bedtime.  . polyethylene glycol (MIRALAX / GLYCOLAX) packet Take 17 g by mouth daily.  Marland Kitchen senna-docusate (SENOKOT-S) 8.6-50 MG tablet Take 1 tablet by mouth 2 (two) times daily.   No facility-administered encounter medications on file as of 07/07/2019.    Review of Systems  Unable to perform ROS: Dementia    Immunization History  Administered Date(s) Administered  . Influenza, High Dose Seasonal PF 04/17/2019  . Influenza,inj,Quad PF,6+ Mos 04/23/2018  . Influenza-Unspecified 05/05/2010, 04/11/2013,  03/03/2015, 09/06/2016, 05/10/2017  . Pneumococcal Polysaccharide-23 12/25/2010  . Pneumococcal-Unspecified 02/28/2013  . Td 06/01/2017  . Tdap 03/22/2006  . Zoster 11/01/2012  . Zoster Recombinat (Shingrix) 05/25/2017, 09/25/2017   Pertinent  Health Maintenance Due  Topic Date Due  . INFLUENZA VACCINE  Completed  . PNA vac Low Risk Adult  Completed   Fall Risk  05/08/2019 09/21/2018 05/29/2018 05/29/2017 04/05/2016  Falls in the past year? 1 Exclusion - non ambulatory 0 No Yes  Number falls in past yr: 1 - 0 - 1  Injury with Fall? 1 - 0 - Yes  Risk for fall due to : History of fall(s);Impaired balance/gait - - - -  Follow up Falls evaluation completed - - - -   Functional Status Survey:    Vitals:   07/08/19 0823  Weight: 181 lb 4.8 oz (82.2 kg)   Body mass index is 30.17 kg/m. Physical Exam Constitutional:      General: He is not in acute distress.    Appearance: He is not diaphoretic.     Comments: asleep  HENT:     Head: Normocephalic and atraumatic.  Neck:     Thyroid: No thyromegaly.     Vascular: No JVD.     Trachea: No tracheal deviation.  Cardiovascular:     Rate and Rhythm: Normal rate and regular rhythm.     Pulses:          Dorsalis pedis pulses are 1+ on the right side and 1+ on the left side.     Heart sounds: No murmur.  Pulmonary:     Effort: Pulmonary effort is normal. No respiratory distress.     Breath sounds: Normal breath sounds. No wheezing.  Abdominal:     General: Bowel sounds are normal. There is no distension.     Palpations: Abdomen is soft.     Tenderness: There is no abdominal tenderness.  Musculoskeletal:     Right lower leg: No edema.     Left lower leg: No edema.     Right foot: No deformity or foot drop.     Left foot: No deformity or foot drop.  Feet:     Left foot:     Skin integrity: Skin integrity normal.     Comments: Bluish discoloration to both feet likely due to venous insuff Lymphadenopathy:     Cervical: No cervical  adenopathy.  Skin:    General: Skin is warm and dry.  Neurological:     General: No focal deficit present.     Mental Status: Mental status is at baseline.  Psychiatric:        Mood and Affect: Mood normal.     Labs reviewed: No results for input(s): NA, K, CL, CO2, GLUCOSE, BUN, CREATININE, CALCIUM, MG, PHOS in the last 8760 hours. No results for input(s): AST, ALT, ALKPHOS, BILITOT, PROT, ALBUMIN in the last 8760 hours. No results for input(s): WBC, NEUTROABS, HGB, HCT, MCV, PLT  in the last 8760 hours. Lab Results  Component Value Date   TSH 2.45 01/09/2019   Lab Results  Component Value Date   HGBA1C 6.7 01/09/2019   Lab Results  Component Value Date   CHOL 124 10/26/2016   HDL 40 10/26/2016   LDLCALC 62 10/26/2016   TRIG 111 10/26/2016    Significant Diagnostic Results in last 30 days:  No results found.  Assessment/Plan 1. Diabetic peripheral neuropathy associated with type 2 diabetes mellitus (HCC) Controlled with Neurontin 100 mg bid   2. Coronary artery disease of native artery of native heart with stable angina pectoris (HCC) Hx of stent, no new issues Continue plavix 75 mg qd   3. Type 2 diabetes mellitus with neurological complications (HCC) Diet controlled, continue to monitor.   4. 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) Progressive decline in cognition and physical function c/w the disease. Continue supportive care in the skilled environment. Followed by Dr Casimiro Needle, continue Tegretol 200 mg in the am and 400 mg in the evening for behavioral issues.   5. Essential tremor Noted but does not interfere with his care.   6. Hyperuricemia Continue allopurinol 100 mg qd     Family/ staff Communication: staff  Labs/tests ordered:  NA

## 2019-07-14 DIAGNOSIS — Z9189 Other specified personal risk factors, not elsewhere classified: Secondary | ICD-10-CM | POA: Diagnosis not present

## 2019-07-14 DIAGNOSIS — Z20828 Contact with and (suspected) exposure to other viral communicable diseases: Secondary | ICD-10-CM | POA: Diagnosis not present

## 2019-07-28 DIAGNOSIS — Z9189 Other specified personal risk factors, not elsewhere classified: Secondary | ICD-10-CM | POA: Diagnosis not present

## 2019-07-28 DIAGNOSIS — Z20828 Contact with and (suspected) exposure to other viral communicable diseases: Secondary | ICD-10-CM | POA: Diagnosis not present

## 2019-08-04 DIAGNOSIS — Z9189 Other specified personal risk factors, not elsewhere classified: Secondary | ICD-10-CM | POA: Diagnosis not present

## 2019-08-04 DIAGNOSIS — Z20828 Contact with and (suspected) exposure to other viral communicable diseases: Secondary | ICD-10-CM | POA: Diagnosis not present

## 2019-08-07 ENCOUNTER — Non-Acute Institutional Stay (SKILLED_NURSING_FACILITY): Payer: PPO | Admitting: Adult Health

## 2019-08-07 DIAGNOSIS — E1149 Type 2 diabetes mellitus with other diabetic neurological complication: Secondary | ICD-10-CM

## 2019-08-07 DIAGNOSIS — G3109 Other frontotemporal dementia: Secondary | ICD-10-CM | POA: Diagnosis not present

## 2019-08-07 DIAGNOSIS — F0280 Dementia in other diseases classified elsewhere without behavioral disturbance: Secondary | ICD-10-CM

## 2019-08-07 DIAGNOSIS — M170 Bilateral primary osteoarthritis of knee: Secondary | ICD-10-CM

## 2019-08-07 DIAGNOSIS — E790 Hyperuricemia without signs of inflammatory arthritis and tophaceous disease: Secondary | ICD-10-CM | POA: Diagnosis not present

## 2019-08-07 DIAGNOSIS — N1832 Chronic kidney disease, stage 3b: Secondary | ICD-10-CM | POA: Diagnosis not present

## 2019-08-07 DIAGNOSIS — F329 Major depressive disorder, single episode, unspecified: Secondary | ICD-10-CM | POA: Diagnosis not present

## 2019-08-07 DIAGNOSIS — E1142 Type 2 diabetes mellitus with diabetic polyneuropathy: Secondary | ICD-10-CM

## 2019-08-07 DIAGNOSIS — N400 Enlarged prostate without lower urinary tract symptoms: Secondary | ICD-10-CM | POA: Diagnosis not present

## 2019-08-08 ENCOUNTER — Encounter: Payer: Self-pay | Admitting: Adult Health

## 2019-08-08 NOTE — Progress Notes (Signed)
Location:  Occupational psychologist of Service:  SNF (31) Provider:   Cindi Carbon, ANP [place] [phone]   [name], DO  Patient Care Team: [name], DO as PCP - General (Geriatric Medicine)  Extended Emergency Contact Information Primary Emergency Contact: Alley,Kelly Address: [address]          [place], [place] [address] [name] of [place] Phone: [phone] Relation: Daughter Secondary Emergency Contact: Daino,[name] Address: [address] [place] [address] [place] of [place] Phone: [phone] Relation: Son  Code Status:  DNR Goals of care: Advanced Directive information Advanced Directives 05/08/2019  Does Patient Have a Medical Advance Directive? No;Yes  Type of Paramedic of [place];Out of facility DNR (pink MOST or yellow form);Living will  Does patient want to make changes to medical advance directive? No - Patient declined  Copy of [place] in Chart? -  Would patient like information on creating a medical advance directive? No - Patient declined  Pre-existing out of facility DNR order (yellow form or pink MOST form) Yellow form placed in chart (order not valid for inpatient use);Pink MOST form placed in chart (order not valid for inpatient use);Pink MOST/Yellow Form most recent copy in chart - Physician notified to receive inpatient order     Chief Complaint  Patient presents with   Medical Management of Chronic Issues    HPI:  Pt is a 79 y.o. male seen today for medical management of chronic diseases.    There are no acute complaints regarding his care. His nurse reports that he hits and pinches during personal care. Other times he sleeps long periods. No fever or cough. Great appetite and keeps gaining weight so his nutritional supplement was discontinued. No reports of knee pain or foot pain. No foot ulcerations. DM controlled  without meds.   Past Medical History:  Diagnosis Date   Dementia ([place])    Diabetes ([place])    Heart disease    Hyperlipidemia    Hypotension    Past Surgical History:  Procedure Laterality Date   APPENDECTOMY  1961   CORONARY ANGIOPLASTY WITH STENT PLACEMENT  2016   TONSILLECTOMY AND ADENOIDECTOMY     TOTAL KNEE ARTHROPLASTY  2012    Allergies  Allergen Reactions   Lortab [Hydrocodone-Acetaminophen] Other (See Comments)    Reaction:  Affected pts cognition    Morphine And Related Other (See Comments)    Reaction:  Affected pts cognition     Outpatient Encounter Medications as of 08/07/2019  Medication Sig   allopurinol (ZYLOPRIM) 100 MG tablet Take 1 tablet (100 mg total) by mouth daily.   bisacodyl (DULCOLAX) 10 MG suppository Place 10 mg rectally daily as needed (constipation).    carbamazepine (TEGRETOL) 200 MG tablet Take 200 mg by mouth every morning. 2 tablets (400 mg) at bedtime   clopidogrel (PLAVIX) 75 MG tablet Take 1 tablet (75 mg total) by mouth daily.   gabapentin (NEURONTIN) 100 MG capsule Take 100 mg by mouth 2 (two) times daily.    hydrOXYzine (VISTARIL) 25 MG capsule Take 25 mg by mouth at bedtime.   polyethylene glycol (MIRALAX / GLYCOLAX) packet Take 17 g by mouth daily.   senna-docusate (SENOKOT-S) 8.6-50 MG tablet Take 1 tablet by mouth 2 (two) times daily.   [DISCONTINUED] GLUCERNA (GLUCERNA) LIQD Take 237 mLs by mouth daily as needed.  No facility-administered encounter medications on file as of 08/07/2019.    Review of Systems  Unable to perform ROS: Dementia    Immunization History  Administered Date(s) Administered   Influenza, High Dose Seasonal PF 04/17/2019   Influenza,inj,Quad PF,6+ Mos 04/23/2018   Influenza-Unspecified 05/05/2010, 04/11/2013, 03/03/2015, 09/06/2016, 05/10/2017   Moderna SARS-COVID-2 Vaccination 07/08/2019, 08/05/2019   Pneumococcal Polysaccharide-23 12/25/2010   Pneumococcal-Unspecified  02/28/2013   Td 06/01/2017   Tdap 03/22/2006   Zoster 11/01/2012   Zoster Recombinat (Shingrix) 05/25/2017, 09/25/2017   Pertinent  Health Maintenance Due  Topic Date Due   INFLUENZA VACCINE  Completed   PNA vac Low Risk Adult  Completed   Fall Risk  05/08/2019 09/21/2018 05/29/2018 05/29/2017 04/05/2016  Falls in the past year? 1 Exclusion - non ambulatory 0 No Yes  Number falls in past yr: 1 - 0 - 1  Injury with Fall? 1 - 0 - Yes  Risk for fall due to : History of fall(s);Impaired balance/gait - - - -  Follow up Falls evaluation completed - - - -   Functional Status Survey:    Vitals:   08/07/19 1433  Weight: 180 lb 12.8 oz (82 kg)   Body mass index is 30.09 kg/m. Physical Exam Vitals and nursing note reviewed.  Constitutional:      General: He is not in acute distress.    Appearance: He is not diaphoretic.     Comments: asleep  HENT:     Head: Normocephalic and atraumatic.  Neck:     Thyroid: No thyromegaly.     Vascular: No JVD.     Trachea: No tracheal deviation.  Cardiovascular:     Rate and Rhythm: Normal rate and regular rhythm.     Pulses:          Dorsalis pedis pulses are 1+ on the left side.     Heart sounds: No murmur.  Pulmonary:     Effort: Pulmonary effort is normal. No respiratory distress.     Breath sounds: Normal breath sounds. No wheezing.  Abdominal:     General: Bowel sounds are normal. There is no distension.     Palpations: Abdomen is soft.     Tenderness: There is no abdominal tenderness.  Musculoskeletal:     Right lower leg: No edema.     Left lower leg: No edema.  Lymphadenopathy:     Cervical: No cervical adenopathy.  Skin:    General: Skin is warm and dry.  Neurological:     General: No focal deficit present.     Mental Status: Mental status is at baseline.  Psychiatric:        Mood and Affect: Mood normal.     Labs reviewed: Recent Labs    05/12/19 0000  NA 140  K 5.0  CL 102  CO2 27*  BUN 26*  CREATININE  1.0  CALCIUM 9.1   Recent Labs    05/12/19 0000  AST 14  ALT 14  ALKPHOS 122  ALBUMIN 4.2   Recent Labs    05/12/19 0000  WBC 8.0  NEUTROABS 6  HGB 13.2*  HCT 41  PLT 220   Lab Results  Component Value Date   TSH 2.45 01/09/2019   Lab Results  Component Value Date   HGBA1C 6.7 01/09/2019   Lab Results  Component Value Date   CHOL 124 10/26/2016   HDL 40 10/26/2016   LDLCALC 62 10/26/2016   TRIG 111 10/26/2016    Significant Diagnostic  Results in last 30 days:  No results found.  Assessment/Plan  1. Type 2 diabetes mellitus with neurological complications [facility]) Lab Results  Component Value Date   HGBA1C 6.7 01/09/2019  Check annually. Foot exam completed at least visit. No ulcerations or there issues.   2. Diabetic peripheral neuropathy associated with type 2 diabetes mellitus ([place]) No issues with pain. Continue Neurontin, which also helps with his behaviors.  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 Periods of agitation and resistance to care. Would not further taper medications. Followed by Dr. Casimiro Needle.   4. Bilateral primary osteoarthritis of knee No reported pain   5. BPH without obstruction/lower urinary tract symptoms Incontinent, no reported issues with retention. Not currently on meds.   6. Stage 3b chronic kidney disease Lab Results  Component Value Date   BUN 26 (A) 05/12/2019   Lab Results  Component Value Date   CREATININE 1.0 05/12/2019   Continue to periodically monitor BMP and avoid nephrotoxic agents  7. Hyperuricemia No new bouts Continue allopurinol 100 mg qd     Family/ staff Communication: staff  Labs/tests ordered:  NA

## 2019-09-02 ENCOUNTER — Encounter: Payer: Self-pay | Admitting: Internal Medicine

## 2019-09-02 ENCOUNTER — Non-Acute Institutional Stay (SKILLED_NURSING_FACILITY): Payer: PPO | Admitting: Internal Medicine

## 2019-09-02 DIAGNOSIS — G3109 Other frontotemporal dementia: Secondary | ICD-10-CM

## 2019-09-02 DIAGNOSIS — K5901 Slow transit constipation: Secondary | ICD-10-CM | POA: Diagnosis not present

## 2019-09-02 DIAGNOSIS — I25118 Atherosclerotic heart disease of native coronary artery with other forms of angina pectoris: Secondary | ICD-10-CM

## 2019-09-02 DIAGNOSIS — F0280 Dementia in other diseases classified elsewhere without behavioral disturbance: Secondary | ICD-10-CM

## 2019-09-02 DIAGNOSIS — M170 Bilateral primary osteoarthritis of knee: Secondary | ICD-10-CM

## 2019-09-02 DIAGNOSIS — E790 Hyperuricemia without signs of inflammatory arthritis and tophaceous disease: Secondary | ICD-10-CM

## 2019-09-02 DIAGNOSIS — E1142 Type 2 diabetes mellitus with diabetic polyneuropathy: Secondary | ICD-10-CM

## 2019-09-02 NOTE — Progress Notes (Signed)
Patient ID: Jose Castillo, male   DOB: 03/29/1941, 79 y.o.   MRN: [id number]  Location:  [facility] Number: [id number] Place of Service:  SNF [phone]) Provider:   [name], DO  Patient Care Team: [name], DO as PCP - General (Geriatric Medicine)  Extended Emergency Contact Information Primary Emergency Contact: Alley,Kelly Address: [address]          [place], [place] [address] [name] of [place] Phone: [phone] Relation: Daughter Secondary Emergency Contact: Sutphen,[name] Address: [address] [place] [address] [place] of [place] Phone: [phone] Relation: Son  Code Status:  DNR Goals of care: Advanced Directive information Advanced Directives 09/02/2019  Does Patient Have a Medical Advance Directive? Yes  Type of Advance Directive Out of facility DNR (pink MOST or yellow form)  Does patient want to make changes to medical advance directive? No - Patient declined  Copy of [place] in Chart? -  Would patient like information on creating a medical advance directive? -  Pre-existing out of facility DNR order (yellow form or pink MOST form) -   Chief Complaint  Patient presents with  . Medical Management of Chronic Issues    Routine Visit     HPI:  Pt is a 79 y.o. male seen today for medical management of chronic diseases.  He lives in snf for long-term care since his functional status declined and he was no longer physically mobile.  He'd been in memory care prior to that after his wife passed away.    [name] has been having increased agitation during care and was seen by geri psych, Dr. Casimiro Needle, who made an adjustment to his medication--he's now on vistaril 25mg  at hs, tegretol 200mg  in the am for his behaviors.  This has been helpful but it's early to tell b/c this was within the week.  He's been sleeping a bit more.  When I saw him today, he was snoring loudly in his  tiltable wheelchair.    He is on a bowel regimen for constipation which has been effective with miralax and senna-s and occasional dulcolax supp.    He has a h/o CAD so is on plavix.  BP has been soft so no meds needed for it.    He takes allopurinol for gout prevention without recent flares.    His knee OA is managed with scheduled tylenol and  His neuropathy with gabapentin which had some dual effect on improving his behaviors when restarted (?due to improved pain or calming, hard to know).    Past Medical History:  Diagnosis Date  . Dementia ([place])   . Diabetes ([place])   . Heart disease   . Hyperlipidemia   . Hypotension    Past Surgical History:  Procedure Laterality Date  . APPENDECTOMY  1961  . CORONARY ANGIOPLASTY WITH STENT PLACEMENT  2016  . TONSILLECTOMY AND ADENOIDECTOMY    . TOTAL KNEE ARTHROPLASTY  2012    Allergies  Allergen Reactions  . Lortab [Hydrocodone-Acetaminophen] Other (See Comments)    Reaction:  Affected pts cognition   . Morphine And Related Other (See Comments)    Reaction:  Affected pts cognition     Outpatient Encounter Medications as of 09/02/2019  Medication Sig  . acetaminophen (TYLENOL) 325 MG tablet Take 325 mg by mouth 3 (three) times daily.  Marland Kitchen allopurinol (ZYLOPRIM) 100 MG tablet Take 1 tablet (  100 mg total) by mouth daily.  . bisacodyl (DULCOLAX) 10 MG suppository Place 10 mg rectally daily as needed (constipation).   . carbamazepine (TEGRETOL) 200 MG tablet Take 200 mg by mouth every morning. 2 tablets (400 mg) at bedtime  . clopidogrel (PLAVIX) 75 MG tablet Take 1 tablet (75 mg total) by mouth daily.  Marland Kitchen gabapentin (NEURONTIN) 100 MG capsule Take 100 mg by mouth 2 (two) times daily.   . hydrOXYzine (VISTARIL) 25 MG capsule Take 25 mg by mouth at bedtime.  . polyethylene glycol (MIRALAX / GLYCOLAX) packet Take 17 g by mouth daily.  Marland Kitchen senna-docusate (SENOKOT-S) 8.6-50 MG tablet Take 1 tablet by mouth 2 (two) times daily.   No  facility-administered encounter medications on file as of 09/02/2019.    Review of Systems  Reason unable to perform ROS: per nursing.  Constitutional: Positive for activity change. Negative for appetite change, chills and unexpected weight change.  HENT: Positive for trouble swallowing. Negative for congestion.   Eyes: Negative for visual disturbance.  Respiratory: Negative for chest tightness and shortness of breath.   Cardiovascular: Negative for chest pain, palpitations and leg swelling.  Gastrointestinal: Positive for constipation. Negative for abdominal pain, diarrhea, nausea and vomiting.  Genitourinary: Negative for dysuria.  Musculoskeletal: Positive for arthralgias and gait problem.  Skin: Negative for color change.  Neurological: Positive for weakness. Negative for dizziness.  Psychiatric/Behavioral: Positive for agitation, behavioral problems and confusion. Negative for sleep disturbance. The patient is not nervous/anxious.     Immunization History  Administered Date(s) Administered  . Influenza, High Dose Seasonal PF 04/17/2019  . Influenza,inj,Quad PF,6+ Mos 04/23/2018  . Influenza-Unspecified 05/05/2010, 04/11/2013, 03/03/2015, 09/06/2016, 05/10/2017  . Moderna SARS-COVID-2 Vaccination 07/08/2019, 08/05/2019  . Pneumococcal Polysaccharide-23 12/25/2010  . Pneumococcal-Unspecified 02/28/2013  . Td 06/01/2017  . Tdap 03/22/2006  . Zoster 11/01/2012  . Zoster Recombinat (Shingrix) 05/25/2017, 09/25/2017   Pertinent  Health Maintenance Due  Topic Date Due  . INFLUENZA VACCINE  Completed  . PNA vac Low Risk Adult  Completed   Fall Risk  05/08/2019 09/21/2018 05/29/2018 05/29/2017 04/05/2016  Falls in the past year? 1 Exclusion - non ambulatory 0 No Yes  Number falls in past yr: 1 - 0 - 1  Injury with Fall? 1 - 0 - Yes  Risk for fall due to : History of fall(s);Impaired balance/gait - - - -  Follow up Falls evaluation completed - - - -   Functional Status Survey:     Vitals:   09/02/19 1102  BP: (!) 146/82  Pulse: 74  Temp: (!) 97.4 F (36.3 C)  SpO2: 95%  Weight: 176 lb (79.8 kg)  Height: 5\' 5"  (1.651 m)   Body mass index is 29.29 kg/m. Physical Exam Constitutional:      Comments: Drowsy this am and drifts back to sleep and starts snoring after a brief arowsal  HENT:     Head: Normocephalic and atraumatic.  Cardiovascular:     Rate and Rhythm: Normal rate and regular rhythm.  Pulmonary:     Effort: Pulmonary effort is normal.     Breath sounds: Normal breath sounds. No wheezing, rhonchi or rales.  Abdominal:     General: Bowel sounds are normal.  Musculoskeletal:     Right lower leg: No edema.     Left lower leg: No edema.     Comments: Pulls his legs up toward his body in contortions  Skin:    General: Skin is warm and dry.  Neurological:  General: No focal deficit present.     Motor: Weakness present.     Gait: Gait abnormal.     Comments: Uses specialized wheelchair  Psychiatric:        Mood and Affect: Mood normal.     Labs reviewed: Recent Labs    05/12/19 0000  NA 140  K 5.0  CL 102  CO2 27*  BUN 26*  CREATININE 1.0  CALCIUM 9.1   Recent Labs    05/12/19 0000  AST 14  ALT 14  ALKPHOS 122  ALBUMIN 4.2   Recent Labs    05/12/19 0000  WBC 8.0  NEUTROABS 6  HGB 13.2*  HCT 41  PLT 220   Lab Results  Component Value Date   TSH 2.45 01/09/2019   Lab Results  Component Value Date   HGBA1C 6.7 01/09/2019   Lab Results  Component Value Date   CHOL 124 10/26/2016   HDL 40 10/26/2016   LDLCALC 62 10/26/2016   TRIG 111 10/26/2016      Assessment/Plan 1. 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 Ethlyn Gallery a bit longer to see if too sedating since it's just been a couple of days since vistaril change  2. Bilateral primary osteoarthritis of knee -cont tylenol routinely, seems more comfortable since this was restarted (had been [place] longstanding from [place] in [place]  and [place] care but got stopped as we tried to decrease pill burden)  3. Diabetic peripheral neuropathy associated with type 2 diabetes mellitus (H[facility]) -weight is down 4 lbs in the past month -has not required meds for DM is several years--must be fed and has dysphagia Lab Results  Component Value Date   HGBA1C 6.7 01/09/2019    4. Hyperuricemia -cont allopurinol for gout flare prevention  5. Coronary artery disease of native artery of native heart with stable angina pectoris ([place]) -continues on plavix due to known CAD  6. Slow transit constipation -cont current bowel regimen as is  Pharmacist, hospital Communication: discussed with snf nurse  Labs/tests ordered:  None  Adreena Willits L. Cambry Spampinato, D.O. [facility] [address], [place] [address] Cell Phone (Mon-Fri 8am-5pm):  [phone] On Call:  [phone] & follow prompts after 5pm & weekends Office Phone:  [phone] Office Fax:  [phone]

## 2019-10-09 ENCOUNTER — Non-Acute Institutional Stay (SKILLED_NURSING_FACILITY): Payer: PPO | Admitting: Adult Health

## 2019-10-09 DIAGNOSIS — F0280 Dementia in other diseases classified elsewhere without behavioral disturbance: Secondary | ICD-10-CM

## 2019-10-09 DIAGNOSIS — E1149 Type 2 diabetes mellitus with other diabetic neurological complication: Secondary | ICD-10-CM | POA: Diagnosis not present

## 2019-10-09 DIAGNOSIS — E790 Hyperuricemia without signs of inflammatory arthritis and tophaceous disease: Secondary | ICD-10-CM | POA: Diagnosis not present

## 2019-10-09 DIAGNOSIS — N1832 Chronic kidney disease, stage 3b: Secondary | ICD-10-CM | POA: Diagnosis not present

## 2019-10-09 DIAGNOSIS — N400 Enlarged prostate without lower urinary tract symptoms: Secondary | ICD-10-CM | POA: Diagnosis not present

## 2019-10-09 DIAGNOSIS — E1142 Type 2 diabetes mellitus with diabetic polyneuropathy: Secondary | ICD-10-CM

## 2019-10-09 DIAGNOSIS — G3109 Other frontotemporal dementia: Secondary | ICD-10-CM | POA: Diagnosis not present

## 2019-10-10 ENCOUNTER — Encounter: Payer: Self-pay | Admitting: Adult Health

## 2019-10-10 NOTE — Progress Notes (Signed)
Location:  Occupational psychologist of Service:  SNF (31) Provider:   Cindi Carbon, ANP [place] [phone]   [name], DO  Patient Care Team: [name], DO as PCP - General (Geriatric Medicine)  Extended Emergency Contact Information Primary Emergency Contact: Alley,Kelly Address: [address]          [place], [place] [address] [name] of [place] Phone: [phone] Relation: Daughter Secondary Emergency Contact: Smolinsky,[name] Address: [address] [place] [address] [place] of [place] Phone: [phone] Relation: Son  Code Status:  DNR Goals of care: Advanced Directive information Advanced Directives 09/02/2019  Does Patient Have a Medical Advance Directive? Yes  Type of Advance Directive Out of facility DNR (pink MOST or yellow form)  Does patient want to make changes to medical advance directive? No - Patient declined  Copy of [place] in Chart? -  Would patient like information on creating a medical advance directive? -  Pre-existing out of facility DNR order (yellow form or pink MOST form) -     Chief Complaint  Patient presents with  . Medical Management of Chronic Issues    HPI:  Pt is a 79 y.o. male seen today for medical management of chronic diseases.    Frontal dementia: The nurse reports that Jose Castillo sleeps much of the day but has periods of wakefulness in the evening hrs. He continues to resist personal care. However, his CNA states that she gave him a shower this week with no difficult. He is followed by Dr. Casimiro Needle. PRN Vistaril has been used for aggression twice in the past week.   DM II: Lab Results  Component Value Date   HGBA1C 6.7 01/09/2019     His appetite is adequate. Weight is stable after a period of gain. Remains on a mech soft diet with no issues of coughing or choking.   CKD III: Lab Results  Component Value Date   BUN 26 (A)  05/12/2019   Lab Results  Component Value Date   CREATININE 1.0 05/12/2019   Hx of gout but no issues have been reported in the past year and he is on allopurinol  Neuropathy: no pain reported. No diabetic ulcers.   Past Medical History:  Diagnosis Date  . Dementia ([place])   . Diabetes ([place])   . Heart disease   . Hyperlipidemia   . Hypotension    Past Surgical History:  Procedure Laterality Date  . APPENDECTOMY  1961  . CORONARY ANGIOPLASTY WITH STENT PLACEMENT  2016  . TONSILLECTOMY AND ADENOIDECTOMY    . TOTAL KNEE ARTHROPLASTY  2012    Allergies  Allergen Reactions  . Lortab [Hydrocodone-Acetaminophen] Other (See Comments)    Reaction:  Affected pts cognition   . Morphine And Related Other (See Comments)    Reaction:  Affected pts cognition     Outpatient Encounter Medications as of 10/09/2019  Medication Sig  . acetaminophen (TYLENOL) 325 MG tablet Take 325 mg by mouth 3 (three) times daily.  Marland Kitchen allopurinol (ZYLOPRIM) 100 MG tablet Take 1 tablet (100 mg total) by mouth daily.  . bisacodyl (DULCOLAX) 10 MG suppository Place 10 mg rectally daily as needed (constipation).   . carbamazepine (TEGRETOL) 200 MG tablet Take 200 mg by mouth every morning. 2 tablets (400 mg) at bedtime  . clopidogrel (PLAVIX) 75 MG tablet Take 1 tablet (75 mg total) by  mouth daily.  Marland Kitchen gabapentin (NEURONTIN) 100 MG capsule Take 100 mg by mouth 2 (two) times daily.   . hydrOXYzine (ATARAX) 10 MG/5ML syrup Take 10 mg by mouth 2 (two) times daily as needed.  . polyethylene glycol (MIRALAX / GLYCOLAX) packet Take 17 g by mouth daily.  Marland Kitchen senna-docusate (SENOKOT-S) 8.6-50 MG tablet Take 1 tablet by mouth 2 (two) times daily.  . [DISCONTINUED] hydrOXYzine (VISTARIL) 25 MG capsule Take 25 mg by mouth at bedtime.   No facility-administered encounter medications on file as of 10/09/2019.    Review of Systems  Unable to perform ROS: Dementia    Immunization History  Administered Date(s) Administered  .  Influenza, High Dose Seasonal PF 04/17/2019  . Influenza,inj,Quad PF,6+ Mos 04/23/2018  . Influenza-Unspecified 05/05/2010, 04/11/2013, 03/03/2015, 09/06/2016, 05/10/2017  . Moderna SARS-COVID-2 Vaccination 07/08/2019, 08/05/2019  . Pneumococcal Polysaccharide-23 12/25/2010  . Pneumococcal-Unspecified 02/28/2013  . Td 06/01/2017  . Tdap 03/22/2006  . Zoster 11/01/2012  . Zoster Recombinat (Shingrix) 05/25/2017, 09/25/2017   Pertinent  Health Maintenance Due  Topic Date Due  . INFLUENZA VACCINE  02/01/2020  . PNA vac Low Risk Adult  Completed   Fall Risk  05/08/2019 09/21/2018 05/29/2018 05/29/2017 04/05/2016  Falls in the past year? 1 Exclusion - non ambulatory 0 No Yes  Number falls in past yr: 1 - 0 - 1  Injury with Fall? 1 - 0 - Yes  Risk for fall due to : History of fall(s);Impaired balance/gait - - - -  Follow up Falls evaluation completed - - - -   Functional Status Survey:    Vitals:   10/10/19 1015  Weight: 179 lb 11.2 oz (81.5 kg)   Body mass index is 29.9 kg/m.  Wt Readings from Last 3 Encounters:  10/10/19 179 lb 11.2 oz (81.5 kg)  09/02/19 176 lb (79.8 kg)  08/07/19 180 lb 12.8 oz (82 kg)    Physical Exam Vitals and nursing note reviewed.  Constitutional:      General: He is not in acute distress.    Appearance: He is not diaphoretic.     Comments: asleep  HENT:     Head: Normocephalic and atraumatic.     Nose: Nose normal. No congestion.     Mouth/Throat:     Mouth: Mucous membranes are moist.     Pharynx: Oropharynx is clear.  Eyes:     Comments: refused  Neck:     Thyroid: No thyromegaly.     Vascular: No JVD.     Trachea: No tracheal deviation.  Cardiovascular:     Rate and Rhythm: Normal rate and regular rhythm.     Heart sounds: No murmur.  Pulmonary:     Effort: Pulmonary effort is normal. No respiratory distress.     Breath sounds: Normal breath sounds. No wheezing.  Abdominal:     General: Bowel sounds are normal. There is no  distension.     Palpations: Abdomen is soft.     Tenderness: There is no abdominal tenderness.  Musculoskeletal:     Right lower leg: No edema.     Left lower leg: No edema.     Comments: Decreased ROM to BUE and BLE  Lymphadenopathy:     Cervical: No cervical adenopathy.  Skin:    General: Skin is warm and dry.  Neurological:     General: No focal deficit present.     Mental Status: Mental status is at baseline.     Comments: Not able to f/c.  Grunts   Psychiatric:        Mood and Affect: Mood normal.     Labs reviewed: Recent Labs    05/12/19 0000  NA 140  K 5.0  CL 102  CO2 27*  BUN 26*  CREATININE 1.0  CALCIUM 9.1   Recent Labs    05/12/19 0000  AST 14  ALT 14  ALKPHOS 122  ALBUMIN 4.2   Recent Labs    05/12/19 0000  WBC 8.0  NEUTROABS 6  HGB 13.2*  HCT 41  PLT 220   Lab Results  Component Value Date   TSH 2.45 01/09/2019   Lab Results  Component Value Date   HGBA1C 6.7 01/09/2019   Lab Results  Component Value Date   CHOL 124 10/26/2016   HDL 40 10/26/2016   LDLCALC 62 10/26/2016   TRIG 111 10/26/2016    Significant Diagnostic Results in last 30 days:  No results found.  Assessment/Plan  1. Frontal dementia (HCC) Severe in nature.  Followed by Dr. Casimiro Needle. Continue Tegretol 200 mg in the am and 400 mg in the pm and as needed Vistaril   2. Diabetic peripheral neuropathy associated with type 2 diabetes mellitus (HCC) No issues with pain Continue Neurontin 100 mg bid   3. Type 2 diabetes mellitus with neurological complications (HCC) Diet controlled Monitor A1C at regular intervals.   4. BPH without obstruction/lower urinary tract symptoms No symptoms and is incontinent   5. Stage 3b chronic kidney disease Continue to periodically monitor BMP and avoid nephrotoxic agents  6. Hyperuricemia No bouts Continue Allopurinol 100 mg qd    Family/ staff Communication: nurse  Labs/tests ordered:  NA

## 2019-11-03 ENCOUNTER — Encounter: Payer: Self-pay | Admitting: Adult Health

## 2019-11-03 ENCOUNTER — Non-Acute Institutional Stay (SKILLED_NURSING_FACILITY): Payer: PPO | Admitting: Adult Health

## 2019-11-03 DIAGNOSIS — E1169 Type 2 diabetes mellitus with other specified complication: Secondary | ICD-10-CM

## 2019-11-03 DIAGNOSIS — E1142 Type 2 diabetes mellitus with diabetic polyneuropathy: Secondary | ICD-10-CM | POA: Diagnosis not present

## 2019-11-03 DIAGNOSIS — E785 Hyperlipidemia, unspecified: Secondary | ICD-10-CM | POA: Diagnosis not present

## 2019-11-03 DIAGNOSIS — G3109 Other frontotemporal dementia: Secondary | ICD-10-CM | POA: Diagnosis not present

## 2019-11-03 DIAGNOSIS — E1149 Type 2 diabetes mellitus with other diabetic neurological complication: Secondary | ICD-10-CM

## 2019-11-03 DIAGNOSIS — F0280 Dementia in other diseases classified elsewhere without behavioral disturbance: Secondary | ICD-10-CM

## 2019-11-03 DIAGNOSIS — E790 Hyperuricemia without signs of inflammatory arthritis and tophaceous disease: Secondary | ICD-10-CM | POA: Diagnosis not present

## 2019-11-03 DIAGNOSIS — G250 Essential tremor: Secondary | ICD-10-CM | POA: Diagnosis not present

## 2019-11-03 NOTE — Progress Notes (Signed)
Location:  [facility] of Service:  SNF (31) Provider:   Cindi Carbon, ANP [place] [phone]   [name], DO  Patient Care Team: [name], DO as PCP - General (Geriatric Medicine)  Extended Emergency Contact Information Primary Emergency Contact: Alley,Kelly Address: [address]          [place], [place] [address] [name] of [place] Phone: [phone] Relation: Daughter Secondary Emergency Contact: Melroy,[name] Address: [address] [place] [address] [place] Phone: [phone] Relation: Son  Code Status:  DNR Goals of care: Advanced Directive information Advanced Directives 09/02/2019  Does Patient Have a Medical Advance Directive? Yes  Type of Advance Directive Out of facility DNR (pink MOST or yellow form)  Does patient want to make changes to medical advance directive? No - Patient declined  Copy of [place] in Chart? -  Would patient like information on creating a medical advance directive? -  Pre-existing out of facility DNR order (yellow form or pink MOST form) -     Chief Complaint  Patient presents with  . Medical Management of Chronic Issues    HPI:  Pt is a 79 y.o. male seen today for medical management of chronic diseases.    He is experiencing severe behavior issues which include yelling, moaning, attempting to get out of bed, staring into space with the appearance of hallucinations, anxiety , etc. Staff use prn hydroxyzine which helps but the behavior is more frequent. For my visit he attempts to hit me during the exam and yells the whole visit. He has not acute complaints per the nurse such as fever or cough. He continues to eat well. Has no issues with joint pain or swelling. No foot pain or diabetic ulcers.  Its difficult to assess his comfort level due to the severity of his dementia.   Past Medical History:  Diagnosis Date  .  Dementia ([place])   . Diabetes ([place])   . Heart disease   . Hyperlipidemia   . Hypotension    Past Surgical History:  Procedure Laterality Date  . APPENDECTOMY  1961  . CORONARY ANGIOPLASTY WITH STENT PLACEMENT  2016  . TONSILLECTOMY AND ADENOIDECTOMY    . TOTAL KNEE ARTHROPLASTY  2012    Allergies  Allergen Reactions  . Lortab [Hydrocodone-Acetaminophen] Other (See Comments)    Reaction:  Affected pts cognition   . Morphine And Related Other (See Comments)    Reaction:  Affected pts cognition     Outpatient Encounter Medications as of 11/03/2019  Medication Sig  . acetaminophen (TYLENOL) 325 MG tablet Take 325 mg by mouth 3 (three) times daily.  Marland Kitchen allopurinol (ZYLOPRIM) 100 MG tablet Take 1 tablet (100 mg total) by mouth daily.  . bisacodyl (DULCOLAX) 10 MG suppository Place 10 mg rectally daily as needed (constipation).   . carbamazepine (TEGRETOL) 200 MG tablet Take 200 mg by mouth every morning. 2 tablets (400 mg) at bedtime  . clopidogrel (PLAVIX) 75 MG tablet Take 1 tablet (75 mg total) by mouth daily.  Marland Kitchen gabapentin (NEURONTIN) 100 MG capsule Take 100 mg by mouth 2 (two) times daily.   . hydrOXYzine (ATARAX) 10 MG/5ML syrup Take 10 mg by mouth 2 (two) times daily as needed.  . polyethylene glycol (MIRALAX / GLYCOLAX) packet Take 17 g by mouth daily.  Marland Kitchen senna-docusate (SENOKOT-S) 8.6-50 MG tablet Take 1  tablet by mouth 2 (two) times daily.   No facility-administered encounter medications on file as of 11/03/2019.    Review of Systems  Unable to perform ROS: Dementia    Immunization History  Administered Date(s) Administered  . Influenza, High Dose Seasonal PF 04/17/2019  . Influenza,inj,Quad PF,6+ Mos 04/23/2018  . Influenza-Unspecified 05/05/2010, 04/11/2013, 03/03/2015, 09/06/2016, 05/10/2017  . Moderna SARS-COVID-2 Vaccination 07/08/2019, 08/05/2019  . Pneumococcal Polysaccharide-23 12/25/2010  . Pneumococcal-Unspecified 02/28/2013  . Td 06/01/2017  . Tdap 03/22/2006   . Zoster 11/01/2012  . Zoster Recombinat (Shingrix) 05/25/2017, 09/25/2017   Pertinent  Health Maintenance Due  Topic Date Due  . INFLUENZA VACCINE  02/01/2020  . PNA vac Low Risk Adult  Completed   Fall Risk  05/08/2019 09/21/2018 05/29/2018 05/29/2017 04/05/2016  Falls in the past year? 1 Exclusion - non ambulatory 0 No Yes  Number falls in past yr: 1 - 0 - 1  Injury with Fall? 1 - 0 - Yes  Risk for fall due to : History of fall(s);Impaired balance/gait - - - -  Follow up Falls evaluation completed - - - -   Functional Status Survey:    Vitals:   11/03/19 1609  Weight: 178 lb 8 oz (81 kg)   Body mass index is 29.7 kg/m.  Wt Readings from Last 3 Encounters:  11/03/19 178 lb 8 oz (81 kg)  10/10/19 179 lb 11.2 oz (81.5 kg)  09/02/19 176 lb (79.8 kg)    Physical Exam Vitals and nursing note reviewed.  Wt Readings from Last 3 Encounters:  11/03/19 178 lb 8 oz (81 kg)  10/10/19 179 lb 11.2 oz (81.5 kg)  09/02/19 176 lb (79.8 kg)     Very agitated for my visit. I could not hear his lung or heart sounds. No skin breakdown was noted. No joint swelling or redness.    Labs reviewed: Recent Labs    05/12/19 0000  NA 140  K 5.0  CL 102  CO2 27*  BUN 26*  CREATININE 1.0  CALCIUM 9.1   Recent Labs    05/12/19 0000  AST 14  ALT 14  ALKPHOS 122  ALBUMIN 4.2   Recent Labs    05/12/19 0000  WBC 8.0  NEUTROABS 6  HGB 13.2*  HCT 41  PLT 220   Lab Results  Component Value Date   TSH 2.45 01/09/2019   Lab Results  Component Value Date   HGBA1C 6.7 01/09/2019   Lab Results  Component Value Date   CHOL 124 10/26/2016   HDL 40 10/26/2016   LDLCALC 62 10/26/2016   TRIG 111 10/26/2016    Significant Diagnostic Results in last 30 days:  No results found.  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 I spoke with the DON and indicated that he should be seen by Dr. Martina Sinner for additional recommendations. Continue  prn hydroxyzine at this time.   2. Essential tremor No current issues, does not interfere with care.   3. Hyperuricemia No new bouts  Continue Allopurinol 100 mg qd   4. Diabetic peripheral neuropathy associated with type 2 diabetes mellitus ([place]) Controlled with scheduled neurontin   5. 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     Family/ staff Communication: nurse  Labs/tests ordered:   Tegretol level in  am

## 2019-11-04 DIAGNOSIS — Z79899 Other long term (current) drug therapy: Secondary | ICD-10-CM | POA: Diagnosis not present

## 2019-11-20 DIAGNOSIS — F329 Major depressive disorder, single episode, unspecified: Secondary | ICD-10-CM | POA: Diagnosis not present

## 2019-11-20 DIAGNOSIS — F0280 Dementia in other diseases classified elsewhere without behavioral disturbance: Secondary | ICD-10-CM | POA: Diagnosis not present

## 2019-12-21 ENCOUNTER — Emergency Department (HOSPITAL_COMMUNITY)
Admission: EM | Admit: 2019-12-21 | Discharge: 2019-12-23 | Disposition: A | Discharge status: Home / Self Care | Payer: PPO | Attending: Emergency Medicine | Admitting: Emergency Medicine

## 2019-12-21 ENCOUNTER — Emergency Department (HOSPITAL_COMMUNITY): Payer: PPO

## 2019-12-21 ENCOUNTER — Encounter (HOSPITAL_COMMUNITY): Payer: Self-pay

## 2019-12-21 DIAGNOSIS — E1140 Type 2 diabetes mellitus with diabetic neuropathy, unspecified: Secondary | ICD-10-CM | POA: Diagnosis not present

## 2019-12-21 DIAGNOSIS — Z79899 Other long term (current) drug therapy: Secondary | ICD-10-CM | POA: Insufficient documentation

## 2019-12-21 DIAGNOSIS — S0990XA Unspecified injury of head, initial encounter: Secondary | ICD-10-CM | POA: Diagnosis not present

## 2019-12-21 DIAGNOSIS — Y92129 Unspecified place in nursing home as the place of occurrence of the external cause: Secondary | ICD-10-CM | POA: Diagnosis not present

## 2019-12-21 DIAGNOSIS — Y9389 Activity, other specified: Secondary | ICD-10-CM | POA: Diagnosis not present

## 2019-12-21 DIAGNOSIS — Y998 Other external cause status: Secondary | ICD-10-CM | POA: Diagnosis not present

## 2019-12-21 DIAGNOSIS — S0181XA Laceration without foreign body of other part of head, initial encounter: Secondary | ICD-10-CM | POA: Diagnosis not present

## 2019-12-21 DIAGNOSIS — I129 Hypertensive chronic kidney disease with stage 1 through stage 4 chronic kidney disease, or unspecified chronic kidney disease: Secondary | ICD-10-CM | POA: Insufficient documentation

## 2019-12-21 DIAGNOSIS — I2510 Atherosclerotic heart disease of native coronary artery without angina pectoris: Secondary | ICD-10-CM | POA: Diagnosis not present

## 2019-12-21 DIAGNOSIS — Z955 Presence of coronary angioplasty implant and graft: Secondary | ICD-10-CM | POA: Diagnosis not present

## 2019-12-21 DIAGNOSIS — W07XXXA Fall from chair, initial encounter: Secondary | ICD-10-CM | POA: Insufficient documentation

## 2019-12-21 DIAGNOSIS — W19XXXA Unspecified fall, initial encounter: Secondary | ICD-10-CM | POA: Diagnosis not present

## 2019-12-21 DIAGNOSIS — R404 Transient alteration of awareness: Secondary | ICD-10-CM | POA: Diagnosis not present

## 2019-12-21 DIAGNOSIS — Z96659 Presence of unspecified artificial knee joint: Secondary | ICD-10-CM | POA: Insufficient documentation

## 2019-12-21 DIAGNOSIS — N3000 Acute cystitis without hematuria: Secondary | ICD-10-CM | POA: Insufficient documentation

## 2019-12-21 DIAGNOSIS — F0390 Unspecified dementia without behavioral disturbance: Secondary | ICD-10-CM | POA: Insufficient documentation

## 2019-12-21 DIAGNOSIS — E1122 Type 2 diabetes mellitus with diabetic chronic kidney disease: Secondary | ICD-10-CM | POA: Insufficient documentation

## 2019-12-21 DIAGNOSIS — Z87891 Personal history of nicotine dependence: Secondary | ICD-10-CM | POA: Diagnosis not present

## 2019-12-21 DIAGNOSIS — R220 Localized swelling, mass and lump, head: Secondary | ICD-10-CM | POA: Diagnosis not present

## 2019-12-21 DIAGNOSIS — N183 Chronic kidney disease, stage 3 unspecified: Secondary | ICD-10-CM | POA: Insufficient documentation

## 2019-12-21 DIAGNOSIS — S199XXA Unspecified injury of neck, initial encounter: Secondary | ICD-10-CM | POA: Diagnosis not present

## 2019-12-21 DIAGNOSIS — R29898 Other symptoms and signs involving the musculoskeletal system: Secondary | ICD-10-CM | POA: Diagnosis not present

## 2019-12-21 DIAGNOSIS — I10 Essential (primary) hypertension: Secondary | ICD-10-CM | POA: Diagnosis not present

## 2019-12-21 DIAGNOSIS — S0993XA Unspecified injury of face, initial encounter: Secondary | ICD-10-CM | POA: Diagnosis not present

## 2019-12-21 LAB — URINALYSIS, ROUTINE W REFLEX MICROSCOPIC
Bilirubin Urine: NEGATIVE
Glucose, UA: NEGATIVE mg/dL
Ketones, ur: NEGATIVE mg/dL
Nitrite: POSITIVE — AB
Protein, ur: 30 mg/dL — AB
Specific Gravity, Urine: 1.018 (ref 1.005–1.030)
WBC, UA: >50 WBC/hpf — ABNORMAL HIGH (ref 0–5)
pH: 5 (ref 5.0–8.0)

## 2019-12-21 LAB — CBC WITH DIFFERENTIAL/PLATELET
Abs Immature Granulocytes: 0.03 10*3/uL (ref 0.00–0.07)
Basophils Absolute: 0.1 10*3/uL (ref 0.0–0.1)
Basophils Relative: 1 %
Eosinophils Absolute: 0.5 10*3/uL (ref 0.0–0.5)
Eosinophils Relative: 6 %
HCT: 40.3 % (ref 39.0–52.0)
Hemoglobin: 13.1 g/dL (ref 13.0–17.0)
Immature Granulocytes: 0 %
Lymphocytes Relative: 13 %
Lymphs Abs: 1 10*3/uL (ref 0.7–4.0)
MCH: 31 pg (ref 26.0–34.0)
MCHC: 32.5 g/dL (ref 30.0–36.0)
MCV: 95.5 fL (ref 80.0–100.0)
Monocytes Absolute: 0.8 10*3/uL (ref 0.1–1.0)
Monocytes Relative: 10 %
Neutro Abs: 5.4 10*3/uL (ref 1.7–7.7)
Neutrophils Relative %: 70 %
Platelets: 219 10*3/uL (ref 150–400)
RBC: 4.22 MIL/uL (ref 4.22–5.81)
RDW: 13.1 % (ref 11.5–15.5)
WBC: 7.8 10*3/uL (ref 4.0–10.5)
nRBC: 0 % (ref 0.0–0.2)

## 2019-12-21 MED ORDER — LORAZEPAM 2 MG/ML IJ SOLN
0.5000 mg | Freq: Once | INTRAMUSCULAR | Status: AC
  Administered 2019-12-21: 0.5 mg via INTRAMUSCULAR
  Filled 2019-12-21: qty 1

## 2019-12-21 NOTE — ED Provider Notes (Signed)
[facility] EMERGENCY DEPARTMENT Provider Note   CSN: [id number] Arrival date & time: 12/21/19  2017     History Chief Complaint  Patient presents with  . Fall    Jose Castillo is a 79 y.o. male.  HPI   Due to a level 5 caveat of dementia HPI cannot be obtained.  Spoke with patient's daughter who is at bed side states that patient lives at [facility] community and is demented.  Daughter explained that she was notified by the retirement community that her father had jumped out of his chair and fell onto his face and brought to the emergency department for further evaluation.  Daughter states patient is at his cognitive baseline, he is nonverbal and does not appear to be acting differently than his usual.  Patient has significant medical history of dementia, diabetes, hyperlipidemia, hypotension and she denies him being on any blood thinners other than Plavix.  Spoke with charge nurse at [facility] who informed me that patient was just finishing up dinner and was sitting in a rocking chair and he rocked himself out of the chair landing face first onto the floor.  It was a witnessed fall, they deny loss of consciousness.  States that patient is generally nonverbal and has been at his baseline throughout the week, he did not receive any  of his nighttime medicines and confirm that he is not anticoagulates other than Plavix.  Past Medical History:  Diagnosis Date  . Dementia ([place])   . Diabetes ([place])   . Heart disease   . Hyperlipidemia   . Hypotension     Patient Active Problem List   Diagnosis Date Noted  . Dysphagia 06/12/2019  . CKD (chronic kidney disease) stage 3, GFR 30-59 ml/min 01/08/2019  . Constipation 06/22/2017  . Gait abnormality 11/13/2016  . Hyperuricemia 10/31/2016  . Essential tremor 09/04/2016  . 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 Type 2 diabetes mellitus with neurological complications ([place]) 30/86/5784  .  Diabetic peripheral neuropathy associated with type 2 diabetes mellitus ([place]) 07/05/2016  . Partial retinal detachment of left eye 04/10/2016  . Benign hypertensive heart disease without heart failure 04/10/2016  . 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 Coronary artery disease of native artery of native heart with stable angina pectoris ([place]) 04/10/2016  . Hyperlipidemia associated with type 2 diabetes mellitus ([place]) 04/05/2016  . BPH without obstruction/lower urinary tract symptoms 04/05/2016  . Bilateral primary osteoarthritis of knee 04/05/2016    Past Surgical History:  Procedure Laterality Date  . APPENDECTOMY  1961  . CORONARY ANGIOPLASTY WITH STENT PLACEMENT  2016  . TONSILLECTOMY AND ADENOIDECTOMY    . TOTAL KNEE ARTHROPLASTY  2012       Family History  Problem Relation Age of Onset  . Cancer Mother 66       Lymphoma  . Heart attack Father 41  . Diabetes Sister   . Brain cancer Sister     Social History   Tobacco Use  . Smoking status: Former Smoker    Packs/day: 0.50    Years: 20.00    Pack years: 10.00    Types: Cigarettes    Quit date: 2003    Years since quitting: 18.4  . Smokeless tobacco: Never Used  Substance Use Topics  . Alcohol use: No    Comment: Quit 2013  . Drug use: No    Home Medications Prior to Admission medications   Medication Sig Start Date  End Date Taking? Authorizing Provider  acetaminophen (TYLENOL) 325 MG tablet Take 325 mg by mouth 3 (three) times daily. 08/28/19   [provider]  allopurinol (ZYLOPRIM) 100 MG tablet Take 1 tablet (100 mg total) by mouth daily. 07/26/16   Reed, Tiffany L, DO  bisacodyl (DULCOLAX) 10 MG suppository Place 10 mg rectally daily as needed (constipation).     [provider]  carbamazepine (TEGRETOL) 200 MG tablet Take 200 mg by mouth every morning. 2 tablets (400 mg) at bedtime    [provider]  clopidogrel (PLAVIX) 75 MG tablet Take 1 tablet (75 mg total) by  mouth daily. 07/26/16   Reed, Tiffany L, DO  gabapentin (NEURONTIN) 100 MG capsule Take 100 mg by mouth 2 (two) times daily.     [provider]  hydrOXYzine (ATARAX) 10 MG/5ML syrup Take 10 mg by mouth 2 (two) times daily as needed.    [provider]  polyethylene glycol (MIRALAX / GLYCOLAX) packet Take 17 g by mouth daily.    [provider]  senna-docusate (SENOKOT-S) 8.6-50 MG tablet Take 1 tablet by mouth 2 (two) times daily.    [provider]    Allergies    Lortab [hydrocodone-acetaminophen] and Morphine and related  Review of Systems   Review of Systems  Unable to perform ROS: Dementia    Physical Exam Updated Vital Signs BP 119/67   Pulse (!) 54   Resp 15   SpO2 97%   Physical Exam Vitals and nursing note reviewed.  Constitutional:      General: He is not in acute distress.    Appearance: He is not ill-appearing.  HENT:     Head: Normocephalic and atraumatic.     Comments: Patient had a small laceration above the right eyebrow, measuring about 2 cm in length.  No other gross abnormalities noted.    Nose: No congestion or rhinorrhea.     Mouth/Throat:     Mouth: Mucous membranes are moist.     Pharynx: Oropharynx is clear. No oropharyngeal exudate or posterior oropharyngeal erythema.  Eyes:     General: No scleral icterus.       Right eye: No discharge.        Left eye: No discharge.     Pupils: Pupils are equal, round, and reactive to light.  Cardiovascular:     Rate and Rhythm: Normal rate and regular rhythm.     Pulses: Normal pulses.     Heart sounds: No murmur heard.  No friction rub. No gallop.   Pulmonary:     Effort: No respiratory distress.     Breath sounds: No wheezing, rhonchi or rales.  Abdominal:     General: There is no distension.     Palpations: Abdomen is soft.     Tenderness: There is no abdominal tenderness. There is no guarding.  Musculoskeletal:        General: No swelling.  Skin:    General: Skin  is warm and dry.     Capillary Refill: Capillary refill takes less than 2 seconds.     Findings: No bruising, lesion or rash.     Comments: Patient had a small abrasion on the right forearm, not actively bleeding, no other abnormalities noted.  Neurological:     Mental Status: He is alert. Mental status is at baseline.  Psychiatric:        Mood and Affect: Mood normal.     ED Results / Procedures / Treatments  Labs (all labs ordered are listed, but only abnormal results are displayed) Labs Reviewed  URINALYSIS, ROUTINE W REFLEX MICROSCOPIC - Abnormal; Notable for the following components:      Result Value   APPearance HAZY (*)    Hgb urine dipstick MODERATE (*)    Protein, ur 30 (*)    Nitrite POSITIVE (*)    Leukocytes,Ua LARGE (*)    WBC, UA >50 (*)    Bacteria, UA MANY (*)    All other components within normal limits  CBC WITH DIFFERENTIAL/PLATELET  CBC WITH DIFFERENTIAL/PLATELET  COMPREHENSIVE METABOLIC PANEL    EKG EKG Interpretation  Date/Time:  Sunday December 21 2019 21:40:14 EDT Ventricular Rate:  60 PR Interval:    QRS Duration: 98 QT Interval:  419 QTC Calculation: 419 R Axis:   -59 Text Interpretation: Sinus rhythm Borderline prolonged PR interval RSR' in V1 or V2, right VCD or RVH Inferior infarct, old Confirmed by Dene Gentry [id number]) on 12/21/2019 9:41:19 PM   Radiology CT Head Wo Contrast  Result Date: 12/21/2019 CLINICAL DATA:  Fall EXAM: CT HEAD WITHOUT CONTRAST TECHNIQUE: Contiguous axial images were obtained from the base of the skull through the vertex without intravenous contrast. COMPARISON:  March 19, 2018 FINDINGS: Brain: No evidence of acute territorial infarction, hemorrhage, hydrocephalus,extra-axial collection or mass lesion/mass effect. There is dilatation the ventricles and sulci consistent with age-related atrophy. Low-attenuation changes in the deep white matter consistent with small vessel ischemia. Vascular: No hyperdense vessel or  unexpected calcification. Skull: The skull is intact. No fracture or focal lesion identified. Sinuses/Orbits: The visualized paranasal sinuses and mastoid air cells are clear. The orbits and globes intact. Other: Soft tissue swelling seen over the right frontal skull. Face: Osseous: No acute fracture or other significant osseous abnormality.The nasal bone, mandibles, zygomatic arches and pterygoid plates are intact. Orbits: No fracture identified. Unremarkable appearance of globes and orbits. Sinuses: The visualized paranasal sinuses and mastoid air cells are unremarkable. Soft tissues:  No acute findings. Limited intracranial: No acute findings. Cervical spine: Alignment: Physiologic Skull base and vertebrae: Visualized skull base is intact. No atlanto-occipital dissociation. The vertebral body heights are well maintained. No fracture or pathologic osseous lesion seen. Soft tissues and spinal canal: The visualized paraspinal soft tissues are unremarkable. No prevertebral soft tissue swelling is seen. The spinal canal is grossly unremarkable, no large epidural collection or significant canal narrowing. Disc levels: Multilevel cervical spine spondylosis is seen with disc osteophyte complex and uncovertebral osteophytes most notable at C4-C5 and C5-C6 with moderate to severe neural foraminal narrowing and mild central canal stenosis. Upper chest: The lung apices are clear. Thoracic inlet is within normal limits. Scattered aortic atherosclerosis is noted. Other: None IMPRESSION: 1. No acute intracranial abnormality. 2. Findings consistent with age related atrophy and chronic small vessel ischemia 3. Soft tissue swelling over the right frontal skull 4. No acute facial fracture 5.  No acute fracture or malalignment of the spine. 6.  Aortic Atherosclerosis (ICD10-I70.0). Electronically Signed   By: Prudencio Pair M.D.   On: 12/21/2019 22:37   CT Cervical Spine Wo Contrast  Result Date: 12/21/2019 CLINICAL DATA:  Fall  EXAM: CT HEAD WITHOUT CONTRAST TECHNIQUE: Contiguous axial images were obtained from the base of the skull through the vertex without intravenous contrast. COMPARISON:  March 19, 2018 FINDINGS: Brain: No evidence of acute territorial infarction, hemorrhage, hydrocephalus,extra-axial collection or mass lesion/mass effect. There is dilatation the ventricles and sulci consistent with age-related atrophy. Low-attenuation changes in the deep white  matter consistent with small vessel ischemia. Vascular: No hyperdense vessel or unexpected calcification. Skull: The skull is intact. No fracture or focal lesion identified. Sinuses/Orbits: The visualized paranasal sinuses and mastoid air cells are clear. The orbits and globes intact. Other: Soft tissue swelling seen over the right frontal skull. Face: Osseous: No acute fracture or other significant osseous abnormality.The nasal bone, mandibles, zygomatic arches and pterygoid plates are intact. Orbits: No fracture identified. Unremarkable appearance of globes and orbits. Sinuses: The visualized paranasal sinuses and mastoid air cells are unremarkable. Soft tissues:  No acute findings. Limited intracranial: No acute findings. Cervical spine: Alignment: Physiologic Skull base and vertebrae: Visualized skull base is intact. No atlanto-occipital dissociation. The vertebral body heights are well maintained. No fracture or pathologic osseous lesion seen. Soft tissues and spinal canal: The visualized paraspinal soft tissues are unremarkable. No prevertebral soft tissue swelling is seen. The spinal canal is grossly unremarkable, no large epidural collection or significant canal narrowing. Disc levels: Multilevel cervical spine spondylosis is seen with disc osteophyte complex and uncovertebral osteophytes most notable at C4-C5 and C5-C6 with moderate to severe neural foraminal narrowing and mild central canal stenosis. Upper chest: The lung apices are clear. Thoracic inlet is within  normal limits. Scattered aortic atherosclerosis is noted. Other: None IMPRESSION: 1. No acute intracranial abnormality. 2. Findings consistent with age related atrophy and chronic small vessel ischemia 3. Soft tissue swelling over the right frontal skull 4. No acute facial fracture 5.  No acute fracture or malalignment of the spine. 6.  Aortic Atherosclerosis (ICD10-I70.0). Electronically Signed   By: Prudencio Pair M.D.   On: 12/21/2019 22:37   CT Maxillofacial WO CM  Result Date: 12/21/2019 CLINICAL DATA:  Fall EXAM: CT HEAD WITHOUT CONTRAST TECHNIQUE: Contiguous axial images were obtained from the base of the skull through the vertex without intravenous contrast. COMPARISON:  March 19, 2018 FINDINGS: Brain: No evidence of acute territorial infarction, hemorrhage, hydrocephalus,extra-axial collection or mass lesion/mass effect. There is dilatation the ventricles and sulci consistent with age-related atrophy. Low-attenuation changes in the deep white matter consistent with small vessel ischemia. Vascular: No hyperdense vessel or unexpected calcification. Skull: The skull is intact. No fracture or focal lesion identified. Sinuses/Orbits: The visualized paranasal sinuses and mastoid air cells are clear. The orbits and globes intact. Other: Soft tissue swelling seen over the right frontal skull. Face: Osseous: No acute fracture or other significant osseous abnormality.The nasal bone, mandibles, zygomatic arches and pterygoid plates are intact. Orbits: No fracture identified. Unremarkable appearance of globes and orbits. Sinuses: The visualized paranasal sinuses and mastoid air cells are unremarkable. Soft tissues:  No acute findings. Limited intracranial: No acute findings. Cervical spine: Alignment: Physiologic Skull base and vertebrae: Visualized skull base is intact. No atlanto-occipital dissociation. The vertebral body heights are well maintained. No fracture or pathologic osseous lesion seen. Soft tissues  and spinal canal: The visualized paraspinal soft tissues are unremarkable. No prevertebral soft tissue swelling is seen. The spinal canal is grossly unremarkable, no large epidural collection or significant canal narrowing. Disc levels: Multilevel cervical spine spondylosis is seen with disc osteophyte complex and uncovertebral osteophytes most notable at C4-C5 and C5-C6 with moderate to severe neural foraminal narrowing and mild central canal stenosis. Upper chest: The lung apices are clear. Thoracic inlet is within normal limits. Scattered aortic atherosclerosis is noted. Other: None IMPRESSION: 1. No acute intracranial abnormality. 2. Findings consistent with age related atrophy and chronic small vessel ischemia 3. Soft tissue swelling over the right frontal skull  4. No acute facial fracture 5.  No acute fracture or malalignment of the spine. 6.  Aortic Atherosclerosis (ICD10-I70.0). Electronically Signed   By: Prudencio Pair M.D.   On: 12/21/2019 22:37    Procedures .Marland KitchenLaceration Repair  Date/Time: 12/21/2019 11:45 PM Performed by: Marcello Fennel, PA-C Authorized by: Marcello Fennel, PA-C   Consent:    Consent obtained:  Verbal   Consent given by:  Guardian   Risks discussed:  Infection, pain, retained foreign body, need for additional repair, poor cosmetic result, tendon damage, vascular damage, poor wound healing and nerve damage   Alternatives discussed:  No treatment Anesthesia (see MAR for exact dosages):    Anesthesia method:  None Laceration details:    Location:  Face   Face location:  Forehead   Length (cm):  2   Depth (mm):  1 Pre-procedure details:    Preparation:  Patient was prepped and draped in usual sterile fashion and imaging obtained to evaluate for foreign bodies Exploration:    Wound exploration: wound explored through full range of motion and entire depth of wound probed and visualized     Contaminated: no   Treatment:    Area cleansed with:  Saline   Amount  of cleaning:  Standard   Irrigation solution:  Sterile saline   Irrigation method:  Syringe   Visualized foreign bodies/material removed: no   Skin repair:    Repair method:  Tissue adhesive Approximation:    Approximation:  Close Post-procedure details:    Dressing:  Non-adherent dressing   Patient tolerance of procedure:  Tolerated well, no immediate complications   (including critical care time)  Medications Ordered in ED Medications  LORazepam (ATIVAN) injection 0.5 mg (0.5 mg Intramuscular Given 12/21/19 2111)    ED Course  I have reviewed the triage vital signs and the nursing notes.  Pertinent labs & imaging results that were available during my care of the patient were reviewed by me and considered in my medical decision making (see chart for details).    MDM Rules/Calculators/A&P                          I have personally reviewed all imaging, labs and have interpreted them.  Most concern for fracture versus intracranial bleed versus infection.  Patient was given Ativan to help relax the patient during this imaging.  Fracture is unlikely as CT head, CT spine, CT maxillofacial did not show any acute abnormalities, physical exam did not show gross abnormalities or ecchymosis.  Unlikely patient is suffering from a head bleed as CT head did not show any acute abnormalities.  Patient's UA showed positive nitrates, large leukocytes, with white blood cells and  bacteria consistent with a UTI.  We will treat with antibiotics.  Laceration on right upper eyebrow will be addressed with Dermabond instead of sutures due to patient becoming agitation when touched and risk of stitches being pulled out with altered mental state.  Patient's CBC did not show leukocytosis or anemia.  CMP still pending   Patient appears to be resting comfortably in bed, showing no obvious signs of distress.  Vital signs have remained stable, lab work reassuring pending CMP.  Likely  patient is suffering from a  uncomplicated UTI and will be treated antibiotics.  Patient will likely be discharged back to nursing facility with antibiotics as long CMP does not show AKI or significant electrolyte abnormalities.  Patient will be given at home instructions  and strict return precautions. Patient will be handed off to [facility] due to shift change   Final Clinical Impression(s) / ED Diagnoses Final diagnoses:  Fall, initial encounter  Minor head injury, initial encounter  Acute cystitis without hematuria    Rx / DC Orders ED Discharge Orders    None       Kofi, Murrell, PA-C 12/22/19 0001    Valarie Merino, MD 12/25/19 [phone]

## 2019-12-21 NOTE — ED Triage Notes (Signed)
Pt bib [facility] from [facility] w/ c/o fall. Pt takes plavix. Pt had witnessed fall from wheelchair, arrives to ED w/ lac to R eye, bleeding controlled. Pt has hx of dementia and is non-verbal. Per EMS, unable to place c-collar on pt d/t pt's anatomy. Pt at his neuro baseline. EMS VSS.

## 2019-12-21 NOTE — ED Notes (Signed)
Pt transported to CT ?

## 2019-12-22 DIAGNOSIS — M2550 Pain in unspecified joint: Secondary | ICD-10-CM | POA: Diagnosis not present

## 2019-12-22 DIAGNOSIS — R5381 Other malaise: Secondary | ICD-10-CM | POA: Diagnosis not present

## 2019-12-22 DIAGNOSIS — W19XXXA Unspecified fall, initial encounter: Secondary | ICD-10-CM | POA: Diagnosis not present

## 2019-12-22 DIAGNOSIS — Z7401 Bed confinement status: Secondary | ICD-10-CM | POA: Diagnosis not present

## 2019-12-22 LAB — COMPREHENSIVE METABOLIC PANEL
ALT: 17 U/L (ref 0–44)
AST: 24 U/L (ref 15–41)
Albumin: 3.7 g/dL (ref 3.5–5.0)
Alkaline Phosphatase: 120 U/L (ref 38–126)
Anion gap: 8 (ref 5–15)
BUN: 18 mg/dL (ref 8–23)
CO2: 27 mmol/L (ref 22–32)
Calcium: 8.9 mg/dL (ref 8.9–10.3)
Chloride: 104 mmol/L (ref 98–111)
Creatinine, Ser: 1.2 mg/dL (ref 0.61–1.24)
GFR calc Af Amer: >60 mL/min (ref >60)
GFR calc non Af Amer: 57 mL/min — ABNORMAL LOW (ref >60)
Glucose, Bld: 173 mg/dL — ABNORMAL HIGH (ref 70–99)
Potassium: 4.8 mmol/L (ref 3.5–5.1)
Sodium: 139 mmol/L (ref 135–145)
Total Bilirubin: 0.4 mg/dL (ref 0.3–1.2)
Total Protein: 6.6 g/dL (ref 6.5–8.1)

## 2019-12-22 MED ORDER — CEPHALEXIN 250 MG PO CAPS: 500.0000 mg | ORAL_CAPSULE | Freq: Once | ORAL | Status: DC

## 2019-12-22 MED ORDER — CEPHALEXIN 500 MG PO CAPS: 500.0000 mg | ORAL_CAPSULE | Freq: Four times a day (QID) | ORAL | 0 refills | Status: AC

## 2019-12-22 NOTE — ED Notes (Signed)
CALLED [facility] FOR TRANSPORT TO [facility]

## 2019-12-22 NOTE — ED Notes (Signed)
This RN dressed this pts wound per MD request.

## 2019-12-22 NOTE — ED Provider Notes (Signed)
  Physical Exam  BP 119/67   Pulse (!) 54   Resp 15   SpO2 97%   Physical Exam  Gen: pt sleeping comfrotably  ED Course/Procedures     Procedures  MDM   Patient signed out to me by Delmer Islam, PA-C. Please see previous notes for further history.  In brief, patient with a history of severe dementia at a facility fell from wheelchair onto the floor, landing face first. This was witnessed, no loss of consciousness. Patient has remained at his baseline mental status per facility staff as well as daughter who is at bedside. Imaging of the head, neck, maxillofacial was negative. Patient with abrasion over the right eyebrow which was repaired with Dermabond. Urine was consistent with infection, he is pending labs including CBC and CMP. Plan for discharge with oral antibiotics if lab work is negative.  Labs interpreted by me, overall reassuring. No leukocytosis. Creatinine stable. Patient is sleeping comfortably on my evaluation. Discussed findings and plan with daughter, who is agreeable. We will add on a urine culture due to patient's age. At this time, patient proceed for discharge. Return precautions given. Patient's daughter states she understands and agrees to plan.       Franchot Heidelberg, PA-C 01/22/56 [id number]    Delora Fuel, MD 83/35/82 [phone]

## 2019-12-22 NOTE — ED Notes (Signed)
[facility] at Bedside for Transport; Report Given

## 2019-12-22 NOTE — Discharge Instructions (Signed)
Take antibiotics as prescribed.  Take the entire course, even if your symptoms improve. Follow-up with your primary care doctor as needed for further evaluation. Return to the emergency room with any new, worsening, or concerning symptoms.

## 2019-12-22 NOTE — ED Notes (Signed)
Pt refusing to be redressed in original attire and refusing medication. Accepting Facility made aware.

## 2019-12-22 NOTE — ED Notes (Signed)
Patient verbalizes understanding of discharge instructions. Opportunity for questioning and answers were provided. Pt is now awaiting [facility] for transport back to facility

## 2019-12-24 LAB — URINE CULTURE: Culture: >=100000 — AB

## 2019-12-26 ENCOUNTER — Non-Acute Institutional Stay (SKILLED_NURSING_FACILITY): Payer: PPO | Admitting: Adult Health

## 2019-12-26 DIAGNOSIS — F0280 Dementia in other diseases classified elsewhere without behavioral disturbance: Secondary | ICD-10-CM

## 2019-12-26 DIAGNOSIS — Z66 Do not resuscitate: Secondary | ICD-10-CM

## 2019-12-26 DIAGNOSIS — E1142 Type 2 diabetes mellitus with diabetic polyneuropathy: Secondary | ICD-10-CM | POA: Diagnosis not present

## 2019-12-26 DIAGNOSIS — N3000 Acute cystitis without hematuria: Secondary | ICD-10-CM | POA: Diagnosis not present

## 2019-12-26 DIAGNOSIS — G3109 Other frontotemporal dementia: Secondary | ICD-10-CM

## 2019-12-26 DIAGNOSIS — E1149 Type 2 diabetes mellitus with other diabetic neurological complication: Secondary | ICD-10-CM

## 2019-12-26 DIAGNOSIS — I25118 Atherosclerotic heart disease of native coronary artery with other forms of angina pectoris: Secondary | ICD-10-CM | POA: Diagnosis not present

## 2019-12-26 NOTE — Progress Notes (Signed)
Location:  [facility] of Service:  SNF (31) Provider:   Cindi Carbon, ANP [place] [phone]   [name], DO  Patient Care Team: [name], DO as PCP - General (Geriatric Medicine)  Extended Emergency Contact Information Primary Emergency Contact: Alley,Kelly Address: [address]          [place], [place] [address] [name] of [place] Phone: [phone] Relation: Daughter Secondary Emergency Contact: Kosch,[name] Address: [address] [place] [address] [place] of [place] Phone: [phone] Relation: Son  Code Status:  DNR Goals of care: Advanced Directive information Advanced Directives 09/02/2019  Does Patient Have a Medical Advance Directive? Yes  Type of Advance Directive Out of facility DNR (pink MOST or yellow form)  Does patient want to make changes to medical advance directive? No - Patient declined  Copy of [place] in Chart? -  Would patient like information on creating a medical advance directive? -  Pre-existing out of facility DNR order (yellow form or pink MOST form) -     Chief Complaint  Patient presents with  . Medical Management of Chronic Issues    HPI:  Pt is a 79 y.o. male seen today for medical management of chronic diseases.    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 He fell forward out of his chair on 6/20 and went to the ER. He hit his head and sustained a laceration to the right frontal area. This area was dermabonded and is healing. A CT of the head/cervical neck was performed which showed age related atrophy, chronic small vessel ischemia, no acute fracture, or head bleed. His urine was also checked which grew 100,000 colonies of Klebsiella and he was placed on Keflex.   Currently he is sitting in his chair asleep and making loud grunting noises. He can be combative during personal care and makes attempts to get  up out of his chair if it is not tilted back.   He is not having any fever or other urinary symptoms.   Past Medical History:  Diagnosis Date  . Dementia ([place])   . Diabetes ([place])   . Heart disease   . Hyperlipidemia   . Hypotension    Past Surgical History:  Procedure Laterality Date  . APPENDECTOMY  1961  . CORONARY ANGIOPLASTY WITH STENT PLACEMENT  2016  . TONSILLECTOMY AND ADENOIDECTOMY    . TOTAL KNEE ARTHROPLASTY  2012    Allergies  Allergen Reactions  . Lortab [Hydrocodone-Acetaminophen] Other (See Comments)    Reaction:  Affected pts cognition   . Morphine And Related Other (See Comments)    Reaction:  Affected pts cognition     Outpatient Encounter Medications as of 12/26/2019  Medication Sig  . acetaminophen (TYLENOL) 325 MG tablet Take 325 mg by mouth 3 (three) times daily.  Marland Kitchen allopurinol (ZYLOPRIM) 100 MG tablet Take 1 tablet (100 mg total) by mouth daily.  . bisacodyl (DULCOLAX) 10 MG suppository Place 10 mg rectally daily as needed (constipation).   . carbamazepine (TEGRETOL) 200 MG tablet Take 200 mg by mouth every morning. 2 tablets (400 mg) at bedtime  . cephALEXin (KEFLEX) 500 MG capsule Take 1 capsule (500 mg total) by mouth 4 (four) times daily for 7 days.  . clopidogrel (PLAVIX) 75 MG tablet Take 1 tablet (75 mg total) by mouth daily.  Marland Kitchen gabapentin (  NEURONTIN) 100 MG capsule Take 100 mg by mouth 2 (two) times daily.   . hydrOXYzine (ATARAX) 10 MG/5ML syrup Take 10 mg by mouth 2 (two) times daily as needed.  . polyethylene glycol (MIRALAX / GLYCOLAX) packet Take 17 g by mouth daily.  Marland Kitchen senna-docusate (SENOKOT-S) 8.6-50 MG tablet Take 1 tablet by mouth 2 (two) times daily.   No facility-administered encounter medications on file as of 12/26/2019.    Review of Systems  Unable to perform ROS: Dementia    Immunization History  Administered Date(s) Administered  . Influenza, High Dose Seasonal PF 04/17/2019  . Influenza,inj,Quad PF,6+ Mos 04/23/2018  .  Influenza-Unspecified 05/05/2010, 04/11/2013, 03/03/2015, 09/06/2016, 05/10/2017  . Moderna SARS-COVID-2 Vaccination 07/08/2019, 08/05/2019  . Pneumococcal Polysaccharide-23 12/25/2010  . Pneumococcal-Unspecified 02/28/2013  . Td 06/01/2017  . Tdap 03/22/2006  . Zoster 11/01/2012  . Zoster Recombinat (Shingrix) 05/25/2017, 09/25/2017   Pertinent  Health Maintenance Due  Topic Date Due  . INFLUENZA VACCINE  02/01/2020  . PNA vac Low Risk Adult  Completed   Fall Risk  05/08/2019 09/21/2018 05/29/2018 05/29/2017 04/05/2016  Falls in the past year? 1 Exclusion - non ambulatory 0 No Yes  Number falls in past yr: 1 - 0 - 1  Injury with Fall? 1 - 0 - Yes  Risk for fall due to : History of fall(s);Impaired balance/gait - - - -  Follow up Falls evaluation completed - - - -   Functional Status Survey:    Vitals:   12/26/19 0925  Weight: 179 lb 6.4 oz (81.4 kg)   Body mass index is 29.85 kg/m.  Wt Readings from Last 3 Encounters:  12/26/19 179 lb 6.4 oz (81.4 kg)  11/03/19 178 lb 8 oz (81 kg)  10/10/19 179 lb 11.2 oz (81.5 kg)   Physical Exam Vitals and nursing note reviewed.  Constitutional:      General: He is not in acute distress.    Appearance: He is not diaphoretic.  HENT:     Head:     Comments: Bruising and small hematoma to right forehead. Laceration to the right forehead with dermabond. No drainage or erythema Neck:     Thyroid: No thyromegaly.     Vascular: No JVD.     Trachea: No tracheal deviation.  Cardiovascular:     Rate and Rhythm: Normal rate and regular rhythm.     Heart sounds: No murmur heard.   Pulmonary:     Effort: Pulmonary effort is normal. No respiratory distress.     Breath sounds: Normal breath sounds. No wheezing.  Abdominal:     General: Bowel sounds are normal. There is no distension.     Palpations: Abdomen is soft.     Tenderness: There is no abdominal tenderness. There is no right CVA tenderness or left CVA tenderness.  Musculoskeletal:       Right lower leg: No edema.     Left lower leg: No edema.  Lymphadenopathy:     Cervical: No cervical adenopathy.  Skin:    General: Skin is warm and dry.  Neurological:     Mental Status: He is alert.     Comments: Asleep, grunting, not able to f/c       Labs reviewed: Recent Labs    05/12/19 0000 12/21/19 2333  NA 140 139  K 5.0 4.8  CL 102 104  CO2 27* 27  GLUCOSE  --  173*  BUN 26* 18  CREATININE 1.0 1.20  CALCIUM 9.1 8.9  Recent Labs    05/12/19 0000 12/21/19 2333  AST 14 24  ALT 14 17  ALKPHOS 122 120  BILITOT  --  0.4  PROT  --  6.6  ALBUMIN 4.2 3.7   Recent Labs    05/12/19 0000 12/21/19 2333  WBC 8.0 7.8  NEUTROABS 6 5.4  HGB 13.2* 13.1  HCT 41 40.3  MCV  --  95.5  PLT 220 219   Lab Results  Component Value Date   TSH 2.45 01/09/2019   Lab Results  Component Value Date   HGBA1C 6.7 01/09/2019   Lab Results  Component Value Date   CHOL 124 10/26/2016   HDL 40 10/26/2016   LDLCALC 62 10/26/2016   TRIG 111 10/26/2016    Significant Diagnostic Results in last 30 days:  CT Head Wo Contrast  Result Date: 12/21/2019 CLINICAL DATA:  Fall EXAM: CT HEAD WITHOUT CONTRAST TECHNIQUE: Contiguous axial images were obtained from the base of the skull through the vertex without intravenous contrast. COMPARISON:  March 19, 2018 FINDINGS: Brain: No evidence of acute territorial infarction, hemorrhage, hydrocephalus,extra-axial collection or mass lesion/mass effect. There is dilatation the ventricles and sulci consistent with age-related atrophy. Low-attenuation changes in the deep white matter consistent with small vessel ischemia. Vascular: No hyperdense vessel or unexpected calcification. Skull: The skull is intact. No fracture or focal lesion identified. Sinuses/Orbits: The visualized paranasal sinuses and mastoid air cells are clear. The orbits and globes intact. Other: Soft tissue swelling seen over the right frontal skull. Face: Osseous: No  acute fracture or other significant osseous abnormality.The nasal bone, mandibles, zygomatic arches and pterygoid plates are intact. Orbits: No fracture identified. Unremarkable appearance of globes and orbits. Sinuses: The visualized paranasal sinuses and mastoid air cells are unremarkable. Soft tissues:  No acute findings. Limited intracranial: No acute findings. Cervical spine: Alignment: Physiologic Skull base and vertebrae: Visualized skull base is intact. No atlanto-occipital dissociation. The vertebral body heights are well maintained. No fracture or pathologic osseous lesion seen. Soft tissues and spinal canal: The visualized paraspinal soft tissues are unremarkable. No prevertebral soft tissue swelling is seen. The spinal canal is grossly unremarkable, no large epidural collection or significant canal narrowing. Disc levels: Multilevel cervical spine spondylosis is seen with disc osteophyte complex and uncovertebral osteophytes most notable at C4-C5 and C5-C6 with moderate to severe neural foraminal narrowing and mild central canal stenosis. Upper chest: The lung apices are clear. Thoracic inlet is within normal limits. Scattered aortic atherosclerosis is noted. Other: None IMPRESSION: 1. No acute intracranial abnormality. 2. Findings consistent with age related atrophy and chronic small vessel ischemia 3. Soft tissue swelling over the right frontal skull 4. No acute facial fracture 5.  No acute fracture or malalignment of the spine. 6.  Aortic Atherosclerosis (ICD10-I70.0). Electronically Signed   By: Prudencio Pair M.D.   On: 12/21/2019 22:37   CT Cervical Spine Wo Contrast  Result Date: 12/21/2019 CLINICAL DATA:  Fall EXAM: CT HEAD WITHOUT CONTRAST TECHNIQUE: Contiguous axial images were obtained from the base of the skull through the vertex without intravenous contrast. COMPARISON:  March 19, 2018 FINDINGS: Brain: No evidence of acute territorial infarction, hemorrhage, hydrocephalus,extra-axial  collection or mass lesion/mass effect. There is dilatation the ventricles and sulci consistent with age-related atrophy. Low-attenuation changes in the deep white matter consistent with small vessel ischemia. Vascular: No hyperdense vessel or unexpected calcification. Skull: The skull is intact. No fracture or focal lesion identified. Sinuses/Orbits: The visualized paranasal sinuses and mastoid air cells  are clear. The orbits and globes intact. Other: Soft tissue swelling seen over the right frontal skull. Face: Osseous: No acute fracture or other significant osseous abnormality.The nasal bone, mandibles, zygomatic arches and pterygoid plates are intact. Orbits: No fracture identified. Unremarkable appearance of globes and orbits. Sinuses: The visualized paranasal sinuses and mastoid air cells are unremarkable. Soft tissues:  No acute findings. Limited intracranial: No acute findings. Cervical spine: Alignment: Physiologic Skull base and vertebrae: Visualized skull base is intact. No atlanto-occipital dissociation. The vertebral body heights are well maintained. No fracture or pathologic osseous lesion seen. Soft tissues and spinal canal: The visualized paraspinal soft tissues are unremarkable. No prevertebral soft tissue swelling is seen. The spinal canal is grossly unremarkable, no large epidural collection or significant canal narrowing. Disc levels: Multilevel cervical spine spondylosis is seen with disc osteophyte complex and uncovertebral osteophytes most notable at C4-C5 and C5-C6 with moderate to severe neural foraminal narrowing and mild central canal stenosis. Upper chest: The lung apices are clear. Thoracic inlet is within normal limits. Scattered aortic atherosclerosis is noted. Other: None IMPRESSION: 1. No acute intracranial abnormality. 2. Findings consistent with age related atrophy and chronic small vessel ischemia 3. Soft tissue swelling over the right frontal skull 4. No acute facial fracture 5.   No acute fracture or malalignment of the spine. 6.  Aortic Atherosclerosis (ICD10-I70.0). Electronically Signed   By: Prudencio Pair M.D.   On: 12/21/2019 22:37   CT Maxillofacial WO CM  Result Date: 12/21/2019 CLINICAL DATA:  Fall EXAM: CT HEAD WITHOUT CONTRAST TECHNIQUE: Contiguous axial images were obtained from the base of the skull through the vertex without intravenous contrast. COMPARISON:  March 19, 2018 FINDINGS: Brain: No evidence of acute territorial infarction, hemorrhage, hydrocephalus,extra-axial collection or mass lesion/mass effect. There is dilatation the ventricles and sulci consistent with age-related atrophy. Low-attenuation changes in the deep white matter consistent with small vessel ischemia. Vascular: No hyperdense vessel or unexpected calcification. Skull: The skull is intact. No fracture or focal lesion identified. Sinuses/Orbits: The visualized paranasal sinuses and mastoid air cells are clear. The orbits and globes intact. Other: Soft tissue swelling seen over the right frontal skull. Face: Osseous: No acute fracture or other significant osseous abnormality.The nasal bone, mandibles, zygomatic arches and pterygoid plates are intact. Orbits: No fracture identified. Unremarkable appearance of globes and orbits. Sinuses: The visualized paranasal sinuses and mastoid air cells are unremarkable. Soft tissues:  No acute findings. Limited intracranial: No acute findings. Cervical spine: Alignment: Physiologic Skull base and vertebrae: Visualized skull base is intact. No atlanto-occipital dissociation. The vertebral body heights are well maintained. No fracture or pathologic osseous lesion seen. Soft tissues and spinal canal: The visualized paraspinal soft tissues are unremarkable. No prevertebral soft tissue swelling is seen. The spinal canal is grossly unremarkable, no large epidural collection or significant canal narrowing. Disc levels: Multilevel cervical spine spondylosis is seen with  disc osteophyte complex and uncovertebral osteophytes most notable at C4-C5 and C5-C6 with moderate to severe neural foraminal narrowing and mild central canal stenosis. Upper chest: The lung apices are clear. Thoracic inlet is within normal limits. Scattered aortic atherosclerosis is noted. Other: None IMPRESSION: 1. No acute intracranial abnormality. 2. Findings consistent with age related atrophy and chronic small vessel ischemia 3. Soft tissue swelling over the right frontal skull 4. No acute facial fracture 5.  No acute fracture or malalignment of the spine. 6.  Aortic Atherosclerosis (ICD10-I70.0). Electronically Signed   By: Prudencio Pair M.D.   On: 12/21/2019  22:37    Assessment/Plan  1. DNR (do not resuscitate) Up dated order  2. Coronary artery disease of native artery of native heart with stable angina pectoris (HCC) Hx of stent Continues on Plavix 75 mg qd.  BP is controlled No longer checking lipids   3. Diabetic peripheral neuropathy associated with type 2 diabetes mellitus (HCC) Pain is controlled Neurontin 100 mg bid  4. Type 2 diabetes mellitus with neurological complications ([place]) Diet controlled Lab Results  Component Value Date   HGBA1C 6.7 01/09/2019    5. Frontal dementia (HCC) Severe. He continues to have periods of agitation and therefore needs to continue on Tegretol 200 each morning and 400 each evening. Level checked in May and WNL  6. Acute cystitis without hematuria Resolving colonization vs active infection    Family/ staff Communication: nurse  Labs/tests ordered:  NA

## 2019-12-27 ENCOUNTER — Encounter: Payer: Self-pay | Admitting: Adult Health

## 2019-12-27 NOTE — Addendum Note (Signed)
Addended by: Barnie Mort on: 12/27/2019 08:44 AM   Modules accepted: Orders

## 2020-02-05 ENCOUNTER — Non-Acute Institutional Stay (SKILLED_NURSING_FACILITY): Payer: PPO | Admitting: Adult Health

## 2020-02-05 DIAGNOSIS — G3109 Other frontotemporal dementia: Secondary | ICD-10-CM | POA: Diagnosis not present

## 2020-02-05 DIAGNOSIS — I119 Hypertensive heart disease without heart failure: Secondary | ICD-10-CM | POA: Diagnosis not present

## 2020-02-05 DIAGNOSIS — F0280 Dementia in other diseases classified elsewhere without behavioral disturbance: Secondary | ICD-10-CM

## 2020-02-05 DIAGNOSIS — E1142 Type 2 diabetes mellitus with diabetic polyneuropathy: Secondary | ICD-10-CM | POA: Diagnosis not present

## 2020-02-05 DIAGNOSIS — E1149 Type 2 diabetes mellitus with other diabetic neurological complication: Secondary | ICD-10-CM | POA: Diagnosis not present

## 2020-02-05 DIAGNOSIS — R1312 Dysphagia, oropharyngeal phase: Secondary | ICD-10-CM | POA: Diagnosis not present

## 2020-02-07 ENCOUNTER — Encounter: Payer: Self-pay | Admitting: Adult Health

## 2020-02-07 NOTE — Progress Notes (Signed)
Location:  [facility] of Service:  SNF (31) Provider:   Cindi Carbon, ANP [place] [phone]   [name], DO  Patient Care Team: [name], DO as PCP - General (Geriatric Medicine)  Extended Emergency Contact Information Primary Emergency Contact: Alley,Kelly Address: [address]          [place], [place] [address] [name] of [place] Phone: [phone] Relation: Daughter Secondary Emergency Contact: Icenhower,[name] Address: [address] [place] [address] [place] of [place] Phone: [phone] Relation: Son  Code Status:  DNR Goals of care: Advanced Directive information Advanced Directives 09/02/2019  Does Patient Have a Medical Advance Directive? Yes  Type of Advance Directive Out of facility DNR (pink MOST or yellow form)  Does patient want to make changes to medical advance directive? No - Patient declined  Copy of [place] in Chart? -  Would patient like information on creating a medical advance directive? -  Pre-existing out of facility DNR order (yellow form or pink MOST form) -     Chief Complaint  Patient presents with   Medical Management of Chronic Issues    HPI:  Pt is a 79 y.o. male seen today for medical management of chronic diseases.    He has a hx of severe frontal dementia and resides in skilled care. He is dependent no staff for all ADLs. He spends much of the day in a supportive chair tilted back to prevent falls.   Pt is lying in bed for the exam. Eyes are open but he does not track me or follow commands. Making loud grunting noises. The nurse has no complaints regarding this resident for my viist.   Functional status:hoyer lift, incontinent  Past Medical History:  Diagnosis Date   Dementia ([place])    Diabetes ([place])    Heart disease    Hyperlipidemia    Hypotension    Past Surgical History:  Procedure Laterality Date    APPENDECTOMY  1961   CORONARY ANGIOPLASTY WITH STENT PLACEMENT  2016   TONSILLECTOMY AND ADENOIDECTOMY     TOTAL KNEE ARTHROPLASTY  2012    Allergies  Allergen Reactions   Lortab [Hydrocodone-Acetaminophen] Other (See Comments)    Reaction:  Affected pts cognition    Morphine And Related Other (See Comments)    Reaction:  Affected pts cognition     Outpatient Encounter Medications as of 02/05/2020  Medication Sig   acetaminophen (TYLENOL) 325 MG tablet Take 325 mg by mouth 3 (three) times daily.   allopurinol (ZYLOPRIM) 100 MG tablet Take 1 tablet (100 mg total) by mouth daily.   bisacodyl (DULCOLAX) 10 MG suppository Place 10 mg rectally daily as needed (constipation).    carbamazepine (TEGRETOL) 200 MG tablet Take 200 mg by mouth every morning. 2 tablets (400 mg) at bedtime   clopidogrel (PLAVIX) 75 MG tablet Take 1 tablet (75 mg total) by mouth daily.   gabapentin (NEURONTIN) 100 MG capsule Take 100 mg by mouth 2 (two) times daily.    hydrOXYzine (ATARAX) 10 MG/5ML syrup Take 10 mg by mouth 2 (two) times daily as needed.   polyethylene glycol (MIRALAX / GLYCOLAX) packet Take 17 g by mouth daily.   senna-docusate (SENOKOT-S) 8.6-50 MG tablet Take 1 tablet by mouth 2 (two) times daily.   No facility-administered encounter medications on file as of 02/05/2020.    Review of Systems  Unable to perform ROS: Dementia    Immunization History  Administered Date(s) Administered   Influenza, High Dose Seasonal PF 04/17/2019   Influenza,inj,Quad PF,6+ Mos 04/23/2018   Influenza-Unspecified 05/05/2010, 04/11/2013, 03/03/2015, 09/06/2016, 05/10/2017   Moderna SARS-COVID-2 Vaccination 07/08/2019, 08/05/2019   Pneumococcal Polysaccharide-23 12/25/2010   Pneumococcal-Unspecified 02/28/2013   Td 06/01/2017   Tdap 03/22/2006   Zoster 11/01/2012   Zoster Recombinat (Shingrix) 05/25/2017, 09/25/2017   Pertinent  Health Maintenance Due  Topic Date Due   INFLUENZA  VACCINE  02/01/2020   PNA vac Low Risk Adult  Completed   Fall Risk  05/08/2019 09/21/2018 05/29/2018 05/29/2017 04/05/2016  Falls in the past year? 1 Exclusion - non ambulatory 0 No Yes  Number falls in past yr: 1 - 0 - 1  Injury with Fall? 1 - 0 - Yes  Risk for fall due to : History of fall(s);Impaired balance/gait - - - -  Follow up Falls evaluation completed - - - -   Functional Status Survey:    Vitals:   02/07/20 0801  Weight: 178 lb 9.6 oz (81 kg)   Body mass index is 29.72 kg/m.  Wt Readings from Last 3 Encounters:  02/07/20 178 lb 9.6 oz (81 kg)  12/26/19 179 lb 6.4 oz (81.4 kg)  11/03/19 178 lb 8 oz (81 kg)   Physical Exam Vitals and nursing note reviewed.  Constitutional:      General: He is not in acute distress.    Appearance: He is not diaphoretic.  HENT:     Head: Normocephalic and atraumatic.  Neck:     Thyroid: No thyromegaly.     Vascular: No JVD.     Trachea: No tracheal deviation.  Cardiovascular:     Rate and Rhythm: Normal rate and regular rhythm.     Heart sounds: No murmur heard.   Pulmonary:     Effort: Pulmonary effort is normal. No respiratory distress.     Breath sounds: Normal breath sounds. No wheezing.  Abdominal:     General: Bowel sounds are normal. There is no distension.     Palpations: Abdomen is soft.     Tenderness: There is no abdominal tenderness. There is no right CVA tenderness or left CVA tenderness.  Musculoskeletal:     Right lower leg: No edema.     Left lower leg: No edema.  Lymphadenopathy:     Cervical: No cervical adenopathy.  Skin:    General: Skin is warm and dry.  Neurological:     Mental Status: He is alert.     Comments: Grunting, not able to f/c       Labs reviewed: Recent Labs    05/12/19 0000 12/21/19 2333  NA 140 139  K 5.0 4.8  CL 102 104  CO2 27* 27  GLUCOSE  --  173*  BUN 26* 18  CREATININE 1.0 1.20  CALCIUM 9.1 8.9   Recent Labs    05/12/19 0000 12/21/19 2333  AST 14 24  ALT 14  17  ALKPHOS 122 120  BILITOT  --  0.4  PROT  --  6.6  ALBUMIN 4.2 3.7   Recent Labs    05/12/19 0000 12/21/19 2333  WBC 8.0 7.8  NEUTROABS 6 5.4  HGB 13.2* 13.1  HCT 41 40.3  MCV  --  95.5  PLT 220 219   Lab Results  Component Value Date   TSH 2.45 01/09/2019   Lab Results  Component Value Date   HGBA1C 6.7 01/09/2019  Lab Results  Component Value Date   CHOL 124 10/26/2016   HDL 40 10/26/2016   LDLCALC 62 10/26/2016   TRIG 111 10/26/2016    Significant Diagnostic Results in last 30 days:  No results found.  Assessment/Plan  1. Frontal dementia (HCC) Severe. Continues with periods of agitation. For this reason would not taper Tegretol. Continue to monitor levels q 6 months.   2. Type 2 diabetes mellitus with neurological complications [facility]) Lab Results  Component Value Date   HGBA1C 6.7 01/09/2019   Diet controlled Monitor annually  3. Diabetic peripheral neuropathy associated with type 2 diabetes mellitus (HCC) No reports of pain.  Continue neurontin 100 mg bid which also seems to help with his behaviors  4. Benign hypertensive heart disease without heart failure BP controlled. No longer monitoring lipids due to his debility Continues on Plavix for prior hx of stent placement  5. Oropharyngeal dysphagia No current issues but he is at risk for aspiration. He should be upright for all meals and continue a mech soft diet with chopped meats.   Family/ staff Communication: nurse  Labs/tests ordered:  NA

## 2020-02-16 DIAGNOSIS — Z9189 Other specified personal risk factors, not elsewhere classified: Secondary | ICD-10-CM | POA: Diagnosis not present

## 2020-02-16 DIAGNOSIS — Z20828 Contact with and (suspected) exposure to other viral communicable diseases: Secondary | ICD-10-CM | POA: Diagnosis not present

## 2020-02-23 DIAGNOSIS — Z20828 Contact with and (suspected) exposure to other viral communicable diseases: Secondary | ICD-10-CM | POA: Diagnosis not present

## 2020-02-23 DIAGNOSIS — Z9189 Other specified personal risk factors, not elsewhere classified: Secondary | ICD-10-CM | POA: Diagnosis not present

## 2020-03-23 ENCOUNTER — Encounter: Payer: Self-pay | Admitting: Internal Medicine

## 2020-03-23 DIAGNOSIS — N390 Urinary tract infection, site not specified: Secondary | ICD-10-CM | POA: Diagnosis not present

## 2020-03-25 DIAGNOSIS — R05 Cough: Secondary | ICD-10-CM | POA: Diagnosis not present

## 2020-03-26 ENCOUNTER — Encounter: Payer: Self-pay | Admitting: Adult Health

## 2020-03-26 ENCOUNTER — Non-Acute Institutional Stay (SKILLED_NURSING_FACILITY): Payer: PPO | Admitting: Adult Health

## 2020-03-26 DIAGNOSIS — R1312 Dysphagia, oropharyngeal phase: Secondary | ICD-10-CM | POA: Diagnosis not present

## 2020-03-26 DIAGNOSIS — N3000 Acute cystitis without hematuria: Secondary | ICD-10-CM | POA: Diagnosis not present

## 2020-03-26 DIAGNOSIS — F0280 Dementia in other diseases classified elsewhere without behavioral disturbance: Secondary | ICD-10-CM

## 2020-03-26 DIAGNOSIS — G3109 Other frontotemporal dementia: Secondary | ICD-10-CM

## 2020-03-26 NOTE — Progress Notes (Signed)
Location:  Occupational psychologist of Service:  SNF (31) Provider:   Cindi Carbon, ANP [place] [phone]  [name], DO  Patient Care Team: [name], DO as PCP - General (Geriatric Medicine)  Extended Emergency Contact Information Primary Emergency Contact: Alley,Kelly Address: [address]          [place], [place] [address] [name] of [place] Phone: [phone] Relation: Daughter Secondary Emergency Contact: Lagerstrom,[name] Address: [address] [place] [address] [place] Phone: [phone] Relation: Son  Code Status:  DNRGoals of care: Advanced Directive information Advanced Directives 03/26/2020  Does Patient Have a Medical Advance Directive? Yes  Type of Paramedic of [place];Living will;Out of facility DNR (pink MOST or yellow form)  Does patient want to make changes to medical advance directive? -  Copy of [place] in Chart? Yes - validated most recent copy scanned in chart (See row information)  Would patient like information on creating a medical advance directive? -  Pre-existing out of facility DNR order (yellow form or pink MOST form) Yellow form placed in chart (order not valid for inpatient use);Pink MOST form placed in chart (order not valid for inpatient use)     Chief Complaint  Patient presents with  . Acute Visit    dysphagia and UTI    HPI:  Pt is a 79 y.o. male seen today for an acute visit for dysphagia and UTI. 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 He is non verbal and non ambulatory and requires assistance for all ADLs. He has resistance to care and aggression/agitation at times and is followed by Dr. Martina Sinner.  On 9/20 he was noted to have a temp of 100.2 and 99.2 and vomited a large amt of whole food at breakfast. Covid swab was obtained which was negative and pt was maintained on  isolation. He is vaccinated with Moderna. He did not void and a bladder scan was performed showing 287 cc and 320 cc. Later a UA was obtained and I and O cath performed. The UA showed 4+ bacteria, 2+ blood, WBC 51-100, neg nitrite and 500 leuk esterase. Final culture 100,000 colonies if Klebsiella. Keflex was started for the UTI on 9/23.  No further urinary retention or GI upset has been noted. In addition, after the vomiting the resident experienced increased coughing at meals. His diet was changed to puree with NTL and he is tolerating this well with less coughing at meals. A CXR was obtained on 9/23 which was WNL.    Past Medical History:  Diagnosis Date  . Dementia ([place])   . Diabetes ([place])   . Heart disease   . Hyperlipidemia   . Hypotension    Past Surgical History:  Procedure Laterality Date  . APPENDECTOMY  1961  . CORONARY ANGIOPLASTY WITH STENT PLACEMENT  2016  . TONSILLECTOMY AND ADENOIDECTOMY    . TOTAL KNEE ARTHROPLASTY  2012    Allergies  Allergen Reactions  . Lortab [Hydrocodone-Acetaminophen] Other (See Comments)    Reaction:  Affected pts cognition   . Morphine And Related Other (See Comments)    Reaction:  Affected pts cognition     Outpatient Encounter Medications as of 03/26/2020  Medication Sig  . cephALEXin (KEFLEX) 500 MG capsule Take 500 mg by mouth 4 (four) times daily.  Marland Kitchen acetaminophen (TYLENOL) 325 MG  tablet Take 325 mg by mouth 3 (three) times daily.  Marland Kitchen allopurinol (ZYLOPRIM) 100 MG tablet Take 1 tablet (100 mg total) by mouth daily.  . bisacodyl (DULCOLAX) 10 MG suppository Place 10 mg rectally daily as needed (constipation).   . carbamazepine (TEGRETOL) 200 MG tablet Take 200 mg by mouth every morning. 2 tablets (400 mg) at bedtime  . clopidogrel (PLAVIX) 75 MG tablet Take 1 tablet (75 mg total) by mouth daily.  Marland Kitchen gabapentin (NEURONTIN) 100 MG capsule Take 100 mg by mouth 2 (two) times daily.   . hydrOXYzine (ATARAX) 10 MG/5ML syrup Take 10 mg by mouth  2 (two) times daily as needed.  . polyethylene glycol (MIRALAX / GLYCOLAX) packet Take 17 g by mouth daily.  Marland Kitchen senna-docusate (SENOKOT-S) 8.6-50 MG tablet Take 1 tablet by mouth 2 (two) times daily.   No facility-administered encounter medications on file as of 03/26/2020.    Review of Systems  Unable to perform ROS: Dementia    Immunization History  Administered Date(s) Administered  . Influenza, High Dose Seasonal PF 04/17/2019  . Influenza,inj,Quad PF,6+ Mos 04/23/2018  . Influenza-Unspecified 05/05/2010, 04/11/2013, 03/03/2015, 09/06/2016, 05/10/2017  . Moderna SARS-COVID-2 Vaccination 07/08/2019, 08/05/2019  . Pneumococcal Polysaccharide-23 12/25/2010  . Pneumococcal-Unspecified 02/28/2013  . Td 06/01/2017  . Tdap 03/22/2006  . Zoster 11/01/2012  . Zoster Recombinat (Shingrix) 05/25/2017, 09/25/2017   Pertinent  Health Maintenance Due  Topic Date Due  . INFLUENZA VACCINE  02/01/2020  . PNA vac Low Risk Adult  Completed   Fall Risk  05/08/2019 09/21/2018 05/29/2018 05/29/2017 04/05/2016  Falls in the past year? 1 Exclusion - non ambulatory 0 No Yes  Number falls in past yr: 1 - 0 - 1  Injury with Fall? 1 - 0 - Yes  Risk for fall due to : History of fall(s);Impaired balance/gait - - - -  Follow up Falls evaluation completed - - - -   Functional Status Survey:    Vitals:   03/26/20 1027  BP: 112/74  Pulse: 70  Resp: 14  Temp: (!) 97.3 F (36.3 C)  SpO2: 97%   There is no height or weight on file to calculate BMI. Physical Exam Vitals and nursing note reviewed.  Constitutional:      General: He is not in acute distress.    Appearance: He is not diaphoretic.     Comments: asleep  HENT:     Head: Normocephalic and atraumatic.     Nose: Nose normal. No congestion.     Mouth/Throat:     Mouth: Mucous membranes are moist.     Pharynx: Oropharynx is clear. No oropharyngeal exudate.  Neck:     Thyroid: No thyromegaly.     Vascular: No JVD.     Trachea: No  tracheal deviation.  Cardiovascular:     Rate and Rhythm: Normal rate and regular rhythm.     Heart sounds: No murmur heard.   Pulmonary:     Effort: Pulmonary effort is normal. No respiratory distress.     Breath sounds: Rhonchi present. No wheezing.  Abdominal:     General: Bowel sounds are normal. There is no distension.     Palpations: Abdomen is soft.     Tenderness: There is no abdominal tenderness. There is no right CVA tenderness or left CVA tenderness.  Musculoskeletal:     Right lower leg: No edema.     Left lower leg: No edema.  Lymphadenopathy:     Cervical: No cervical adenopathy.  Skin:  General: Skin is warm and dry.  Neurological:     Comments: Asleep but arouses to physical stimuli Decreased ROM to BUE and BLE. No obvious focal deficit. No able to f/c  Psychiatric:        Mood and Affect: Mood normal.     Labs reviewed: Recent Labs    05/12/19 0000 12/21/19 2333  NA 140 139  K 5.0 4.8  CL 102 104  CO2 27* 27  GLUCOSE  --  173*  BUN 26* 18  CREATININE 1.0 1.20  CALCIUM 9.1 8.9   Recent Labs    05/12/19 0000 12/21/19 2333  AST 14 24  ALT 14 17  ALKPHOS 122 120  BILITOT  --  0.4  PROT  --  6.6  ALBUMIN 4.2 3.7   Recent Labs    05/12/19 0000 12/21/19 2333  WBC 8.0 7.8  NEUTROABS 6 5.4  HGB 13.2* 13.1  HCT 41 40.3  MCV  --  95.5  PLT 220 219   Lab Results  Component Value Date   TSH 2.45 01/09/2019   Lab Results  Component Value Date   HGBA1C 6.7 01/09/2019   Lab Results  Component Value Date   CHOL 124 10/26/2016   HDL 40 10/26/2016   LDLCALC 62 10/26/2016   TRIG 111 10/26/2016    Significant Diagnostic Results in last 30 days:  No results found.  Assessment/Plan 1. Oropharyngeal dysphagia Downgraded diet to Puree NTL 1:1 supervision, asp prec in place DNR in place with no feeding tubes.   2. 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 Behaviors well controlled on current regimen recommended by Dr. Sheran Spine   3.  Acute cystitis without hematuria Continue Keflex 500 mg qid x 7 days  If urinary retention scan bladder and I and O cath if PVR >250    Family/ staff Communication: called and left a message with his daughter Claiborne Billings to provide an update and discuss goals of care.   Labs/tests ordered:  NA

## 2020-05-10 ENCOUNTER — Non-Acute Institutional Stay (SKILLED_NURSING_FACILITY): Payer: PPO | Admitting: Adult Health

## 2020-05-10 DIAGNOSIS — N400 Enlarged prostate without lower urinary tract symptoms: Secondary | ICD-10-CM | POA: Diagnosis not present

## 2020-05-10 DIAGNOSIS — E1149 Type 2 diabetes mellitus with other diabetic neurological complication: Secondary | ICD-10-CM | POA: Diagnosis not present

## 2020-05-10 DIAGNOSIS — F0280 Dementia in other diseases classified elsewhere without behavioral disturbance: Secondary | ICD-10-CM | POA: Diagnosis not present

## 2020-05-10 DIAGNOSIS — R1312 Dysphagia, oropharyngeal phase: Secondary | ICD-10-CM | POA: Diagnosis not present

## 2020-05-10 DIAGNOSIS — G3109 Other frontotemporal dementia: Secondary | ICD-10-CM

## 2020-05-10 DIAGNOSIS — N1832 Chronic kidney disease, stage 3b: Secondary | ICD-10-CM | POA: Diagnosis not present

## 2020-05-10 DIAGNOSIS — E1142 Type 2 diabetes mellitus with diabetic polyneuropathy: Secondary | ICD-10-CM

## 2020-05-10 NOTE — Progress Notes (Signed)
Location:  Occupational psychologist of Service:  SNF (31) Provider:   Cindi Carbon, ANP [place] [phone]  [name], DO  Patient Care Team: [name], DO as PCP - General (Geriatric Medicine)  Extended Emergency Contact Information Primary Emergency Contact: Alley,Kelly Address: [address]          [place], [place] [address] [name] of [place] Phone: [phone] Relation: Daughter Secondary Emergency Contact: Wrage,[name] Address: [address] [place] [address] [place] Phone: [phone] Relation: Son  Code Status:  DNR Goals of care: Advanced Directive information Advanced Directives 03/26/2020  Does Patient Have a Medical Advance Directive? Yes  Type of Paramedic of [place];Living will;Out of facility DNR (pink MOST or yellow form)  Does patient want to make changes to medical advance directive? -  Copy of [place] in Chart? Yes - validated most recent copy scanned in chart (See row information)  Would patient like information on creating a medical advance directive? -  Pre-existing out of facility DNR order (yellow form or pink MOST form) Yellow form placed in chart (order not valid for inpatient use);Pink MOST form placed in chart (order not valid for inpatient use)     Chief Complaint  Patient presents with   Medical Management of Chronic Issues    HPI:  Pt is a 79 y.o. male seen today for management of chronic diseases.   Frontal dementia: continues with periods of yelling and moaning followed by periods of lethargy. Can be resistant to personal care and combative.   Dysphagia: currently on D1 diet and tolerating well. No recent fever or pna  DM II: diet controlled  Lab Results  Component Value Date   HGBA1C 6.7 01/09/2019   BPH: incontinent at this time. No issues with fever or distended bladder. Treated for UTI  last month and had some retention then. Output recorded in matrix    Past Medical History:  Diagnosis Date   Dementia ([place])    Diabetes ([place])    Heart disease    Hyperlipidemia    Hypotension    Past Surgical History:  Procedure Laterality Date   APPENDECTOMY  1961   CORONARY ANGIOPLASTY WITH STENT PLACEMENT  2016   TONSILLECTOMY AND ADENOIDECTOMY     TOTAL KNEE ARTHROPLASTY  2012    Allergies  Allergen Reactions   Lortab [Hydrocodone-Acetaminophen] Other (See Comments)    Reaction:  Affected pts cognition    Morphine And Related Other (See Comments)    Reaction:  Affected pts cognition     Outpatient Encounter Medications as of 05/10/2020  Medication Sig   acetaminophen (TYLENOL) 325 MG tablet Take 325 mg by mouth 3 (three) times daily.   allopurinol (ZYLOPRIM) 100 MG tablet Take 1 tablet (100 mg total) by mouth daily.   carbamazepine (TEGRETOL) 200 MG tablet Take 200 mg by mouth every morning. 2 tablets (400 mg) at bedtime   clopidogrel (PLAVIX) 75 MG tablet Take 1 tablet (75 mg total) by mouth daily.   gabapentin (NEURONTIN) 100 MG capsule Take 100 mg by mouth 2 (two) times daily.    hydrOXYzine (ATARAX) 10 MG/5ML syrup Take 10 mg by mouth 2 (two) times daily as needed.   polyethylene glycol (MIRALAX / GLYCOLAX) packet Take 17 g by mouth daily.   senna-docusate (SENOKOT-S) 8.6-50 MG tablet Take 1 tablet by mouth 2 (  two) times daily.   bisacodyl (DULCOLAX) 10 MG suppository Place 10 mg rectally daily as needed (constipation).    No facility-administered encounter medications on file as of 05/10/2020.    Review of Systems  Unable to perform ROS: Dementia    Immunization History  Administered Date(s) Administered   Influenza, High Dose Seasonal PF 04/17/2019   Influenza,inj,Quad PF,6+ Mos 04/23/2018   Influenza-Unspecified 05/05/2010, 04/11/2013, 03/03/2015, 09/06/2016, 05/10/2017   Moderna SARS-COVID-2 Vaccination 07/08/2019, 08/05/2019    Pneumococcal Polysaccharide-23 12/25/2010   Pneumococcal-Unspecified 02/28/2013   Td 06/01/2017   Tdap 03/22/2006   Zoster 11/01/2012   Zoster Recombinat (Shingrix) 05/25/2017, 09/25/2017   Pertinent  Health Maintenance Due  Topic Date Due   INFLUENZA VACCINE  02/01/2020   PNA vac Low Risk Adult  Completed   Fall Risk  05/08/2019 09/21/2018 05/29/2018 05/29/2017 04/05/2016  Falls in the past year? 1 Exclusion - non ambulatory 0 No Yes  Number falls in past yr: 1 - 0 - 1  Injury with Fall? 1 - 0 - Yes  Risk for fall due to : History of fall(s);Impaired balance/gait - - - -  Follow up Falls evaluation completed - - - -   Functional Status Survey:    Vitals:   05/13/20 1525  Weight: 174 lb (78.9 kg)   Body mass index is 28.96 kg/m.  Wt Readings from Last 3 Encounters:  05/13/20 174 lb (78.9 kg)  02/07/20 178 lb 9.6 oz (81 kg)  12/26/19 179 lb 6.4 oz (81.4 kg)    Physical Exam Vitals and nursing note reviewed.  Constitutional:      General: He is not in acute distress.    Appearance: He is not diaphoretic.  HENT:     Head: Normocephalic and atraumatic.  Neck:     Thyroid: No thyromegaly.     Vascular: No JVD.     Trachea: No tracheal deviation.  Cardiovascular:     Rate and Rhythm: Normal rate and regular rhythm.     Heart sounds: No murmur heard.   Pulmonary:     Effort: Pulmonary effort is normal. No respiratory distress.     Breath sounds: Normal breath sounds. No wheezing.  Abdominal:     General: Bowel sounds are normal. There is no distension.     Palpations: Abdomen is soft.     Tenderness: There is no abdominal tenderness.  Musculoskeletal:        General: No swelling, tenderness, deformity or signs of injury.     Right lower leg: No edema.     Left lower leg: No edema.     Comments: Decreased ROM to BUE and BLE  Lymphadenopathy:     Cervical: No cervical adenopathy.  Skin:    General: Skin is warm and dry.  Neurological:     Mental Status: He  is alert. Mental status is at baseline.  Psychiatric:     Comments: Wide eyed, restless     Labs reviewed: Recent Labs    12/21/19 2333  NA 139  K 4.8  CL 104  CO2 27  GLUCOSE 173*  BUN 18  CREATININE 1.20  CALCIUM 8.9   Recent Labs    12/21/19 2333  AST 24  ALT 17  ALKPHOS 120  BILITOT 0.4  PROT 6.6  ALBUMIN 3.7   Recent Labs    12/21/19 2333  WBC 7.8  NEUTROABS 5.4  HGB 13.1  HCT 40.3  MCV 95.5  PLT 219   Lab Results  Component  Value Date   TSH 2.45 01/09/2019   Lab Results  Component Value Date   HGBA1C 6.7 01/09/2019   Lab Results  Component Value Date   CHOL 124 10/26/2016   HDL 40 10/26/2016   LDLCALC 62 10/26/2016   TRIG 111 10/26/2016    Significant Diagnostic Results in last 30 days:  No results found.  Assessment/Plan  1. BPH without obstruction/lower urinary tract symptoms Not currently having symptoms but is at risk for retention and recurrent UTI Would avoid foley cath if at all possible as he is easily agitated and would potentially pull it out   2. 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  Continue tegretol and monitor levels at regular intervals.   3. Stage 3b chronic kidney disease ([place]) Continue to periodically monitor BMP and avoid nephrotoxic agents  4. Oropharyngeal dysphagia Continue D1 diet with NTL Asp prec 1:1 supervision   5. Diabetic peripheral neuropathy associated with type 2 diabetes mellitus ([place]) Controlled with neurontin   6. DM II Diet controlled  Goal A1C<8%  Family/ staff Communication: discussed with nurse.    Labs/tests ordered:  teg level 05/13/20

## 2020-05-13 ENCOUNTER — Encounter: Payer: Self-pay | Admitting: Adult Health

## 2020-06-04 ENCOUNTER — Encounter: Payer: Self-pay | Admitting: Internal Medicine

## 2020-06-04 DIAGNOSIS — Z79899 Other long term (current) drug therapy: Secondary | ICD-10-CM | POA: Diagnosis not present

## 2020-06-11 ENCOUNTER — Non-Acute Institutional Stay (SKILLED_NURSING_FACILITY): Payer: PPO | Admitting: Adult Health

## 2020-06-11 DIAGNOSIS — E785 Hyperlipidemia, unspecified: Secondary | ICD-10-CM | POA: Diagnosis not present

## 2020-06-11 DIAGNOSIS — R1312 Dysphagia, oropharyngeal phase: Secondary | ICD-10-CM

## 2020-06-11 DIAGNOSIS — F0280 Dementia in other diseases classified elsewhere without behavioral disturbance: Secondary | ICD-10-CM

## 2020-06-11 DIAGNOSIS — E1169 Type 2 diabetes mellitus with other specified complication: Secondary | ICD-10-CM | POA: Diagnosis not present

## 2020-06-11 DIAGNOSIS — G3109 Other frontotemporal dementia: Secondary | ICD-10-CM

## 2020-06-11 DIAGNOSIS — E1149 Type 2 diabetes mellitus with other diabetic neurological complication: Secondary | ICD-10-CM

## 2020-06-11 DIAGNOSIS — N1832 Chronic kidney disease, stage 3b: Secondary | ICD-10-CM

## 2020-06-11 DIAGNOSIS — I25118 Atherosclerotic heart disease of native coronary artery with other forms of angina pectoris: Secondary | ICD-10-CM | POA: Diagnosis not present

## 2020-06-11 NOTE — Progress Notes (Signed)
Location:  Occupational psychologist of Service:  SNF (31) Provider:   Cindi Carbon, ANP [place] [phone]  Jose Curry, DO  Patient Care Team: Jose Curry, DO as PCP - General (Geriatric Medicine)  Extended Emergency Contact Information Primary Emergency Contact: Alley,Kelly Address: [address]          [place], [place] [address] [name] of [place] Phone: [phone] Relation: Daughter Secondary Emergency Contact: Nall,[name] Address: [address] [place] [address] [place] of [place] Phone: [phone] Relation: Son  Code Status:  DNR Goals of care: Advanced Directive information Advanced Directives 03/26/2020  Does Patient Have a Medical Advance Directive? Yes  Type of Paramedic of [place];Living will;Out of facility DNR (pink MOST or yellow form)  Does patient want to make changes to medical advance directive? -  Copy of [place] in Chart? Yes - validated most recent copy scanned in chart (See row information)  Would patient like information on creating a medical advance directive? -  Pre-existing out of facility DNR order (yellow form or pink MOST form) Yellow form placed in chart (order not valid for inpatient use);Pink MOST form placed in chart (order not valid for inpatient use)     Chief Complaint  Patient presents with   Medical Management of Chronic Issues    HPI:  Pt is a 79 y.o. male seen today for management of chronic diseases.   Jose Castillo has severe frontal dementia and resides in skilled care (FAST 7).  Needs help with all ADLs, incontinent, hoyer lift, minimally verbal. Resistant to personal care at times and periodically hits or kicks the staff.  For my visit he is sleeping in his chair. There are no complaints from the nurse.   Past Medical History:  Diagnosis Date   Dementia ([place])    Diabetes ([place])    Heart disease     Hyperlipidemia    Hypotension    Past Surgical History:  Procedure Laterality Date   APPENDECTOMY  1961   CORONARY ANGIOPLASTY WITH STENT PLACEMENT  2016   TONSILLECTOMY AND ADENOIDECTOMY     TOTAL KNEE ARTHROPLASTY  2012    Allergies  Allergen Reactions   Lortab [Hydrocodone-Acetaminophen] Other (See Comments)    Reaction:  Affected pts cognition    Morphine And Related Other (See Comments)    Reaction:  Affected pts cognition     Outpatient Encounter Medications as of 06/11/2020  Medication Sig   acetaminophen (TYLENOL) 325 MG tablet Take 325 mg by mouth 3 (three) times daily.   allopurinol (ZYLOPRIM) 100 MG tablet Take 1 tablet (100 mg total) by mouth daily.   bisacodyl (DULCOLAX) 10 MG suppository Place 10 mg rectally daily as needed (constipation).    carbamazepine (TEGRETOL) 200 MG tablet Take 200 mg by mouth every morning. 2 tablets (400 mg) at bedtime   clopidogrel (PLAVIX) 75 MG tablet Take 1 tablet (75 mg total) by mouth daily.   gabapentin (NEURONTIN) 100 MG capsule Take 100 mg by mouth 2 (two) times daily.    hydrOXYzine (ATARAX) 10 MG/5ML syrup Take 10 mg by mouth 2 (two) times daily as needed.   polyethylene glycol (MIRALAX / GLYCOLAX) packet Take 17 g by mouth daily.   senna-docusate (SENOKOT-S) 8.6-50 MG tablet Take 1 tablet by mouth 2 (two) times daily.   No facility-administered encounter medications on file as of  06/11/2020.    Review of Systems  Unable to perform ROS: Dementia    Immunization History  Administered Date(s) Administered   Influenza, High Dose Seasonal PF 04/17/2019   Influenza,inj,Quad PF,6+ Mos 04/23/2018   Influenza-Unspecified 05/05/2010, 04/11/2013, 03/03/2015, 09/06/2016, 05/10/2017, 04/27/2020   Moderna Sars-Covid-2 Vaccination 07/08/2019, 08/05/2019   Pneumococcal Polysaccharide-23 12/25/2010   Pneumococcal-Unspecified 02/28/2013   Td 06/01/2017   Tdap 03/22/2006   Zoster 11/01/2012   Zoster  Recombinat (Shingrix) 05/25/2017, 09/25/2017   Pertinent  Health Maintenance Due  Topic Date Due   INFLUENZA VACCINE  Completed   PNA vac Low Risk Adult  Completed   Fall Risk  05/08/2019 09/21/2018 05/29/2018 05/29/2017 04/05/2016  Falls in the past year? 1 Exclusion - non ambulatory 0 No Yes  Number falls in past yr: 1 - 0 - 1  Injury with Fall? 1 - 0 - Yes  Risk for fall due to : History of fall(s);Impaired balance/gait - - - -  Follow up Falls evaluation completed - - - -   Functional Status Survey:    Vitals:   06/14/20 1618  Weight: 173 lb 6.4 oz (78.7 kg)   Body mass index is 28.86 kg/m.  Wt Readings from Last 3 Encounters:  06/14/20 173 lb 6.4 oz (78.7 kg)  05/13/20 174 lb (78.9 kg)  02/07/20 178 lb 9.6 oz (81 kg)    Physical Exam Vitals and nursing note reviewed.  Constitutional:      General: He is not in acute distress.    Appearance: He is not diaphoretic.  HENT:     Head: Normocephalic and atraumatic.  Neck:     Thyroid: No thyromegaly.     Vascular: No JVD.     Trachea: No tracheal deviation.  Cardiovascular:     Rate and Rhythm: Normal rate and regular rhythm.     Heart sounds: No murmur heard.   Pulmonary:     Effort: Pulmonary effort is normal. No respiratory distress.     Breath sounds: Normal breath sounds. No wheezing.  Abdominal:     General: Bowel sounds are normal. There is no distension.     Palpations: Abdomen is soft.     Tenderness: There is no abdominal tenderness.  Musculoskeletal:        General: No swelling, tenderness, deformity or signs of injury.     Right lower leg: No edema.     Left lower leg: No edema.     Comments: Decreased ROM to BUE and BLE  Lymphadenopathy:     Cervical: No cervical adenopathy.  Skin:    General: Skin is warm and dry.  Neurological:     Mental Status:Baseline  Psychiatric:     Comments: asleep difficult to arouse    Labs reviewed: Recent Labs    12/21/19 2333  NA 139  K 4.8  CL 104   CO2 27  GLUCOSE 173*  BUN 18  CREATININE 1.20  CALCIUM 8.9   Recent Labs    12/21/19 2333  AST 24  ALT 17  ALKPHOS 120  BILITOT 0.4  PROT 6.6  ALBUMIN 3.7   Recent Labs    12/21/19 2333  WBC 7.8  NEUTROABS 5.4  HGB 13.1  HCT 40.3  MCV 95.5  PLT 219   Lab Results  Component Value Date   TSH 2.45 01/09/2019   Lab Results  Component Value Date   HGBA1C 6.7 01/09/2019   Lab Results  Component Value Date   CHOL 124 10/26/2016  HDL 40 10/26/2016   LDLCALC 62 10/26/2016   TRIG 111 10/26/2016    Significant Diagnostic Results in last 30 days:  No results found.  Assessment/Plan  1. Oropharyngeal dysphagia No new issues Continue D1 diet Asp prec 1:1 supervision   2. Coronary artery disease of native artery of native heart with stable angina pectoris ([place]) No current issues, continues on Plavix 75 mg qd   3. Hyperlipidemia associated with type 2 diabetes mellitus ([place]) No longer on statin therapy due to dementia severity and goals of care   4. Type 2 diabetes mellitus with neurological complications ([facility]) Diet controllled Lab Results  Component Value Date   HGBA1C 6.7 01/09/2019  Goal <8%   5. 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 Casimiro Needle for behaviors  Teg level 8.9 on 06/04/20 DNR and most for min place.   6. Stage 3b chronic kidney disease ([place]) Continue to periodically monitor BMP and avoid nephrotoxic agents   Family/ staff Communication: discussed with nurse.    Labs/tests ordered:  NA

## 2020-06-14 ENCOUNTER — Encounter: Payer: Self-pay | Admitting: Adult Health

## 2020-07-22 ENCOUNTER — Non-Acute Institutional Stay (SKILLED_NURSING_FACILITY): Payer: PPO | Admitting: Adult Health

## 2020-07-22 DIAGNOSIS — F0280 Dementia in other diseases classified elsewhere without behavioral disturbance: Secondary | ICD-10-CM

## 2020-07-22 DIAGNOSIS — I119 Hypertensive heart disease without heart failure: Secondary | ICD-10-CM | POA: Diagnosis not present

## 2020-07-22 DIAGNOSIS — N1832 Chronic kidney disease, stage 3b: Secondary | ICD-10-CM

## 2020-07-22 DIAGNOSIS — G3109 Other frontotemporal dementia: Secondary | ICD-10-CM | POA: Diagnosis not present

## 2020-07-22 DIAGNOSIS — R1312 Dysphagia, oropharyngeal phase: Secondary | ICD-10-CM | POA: Diagnosis not present

## 2020-07-27 ENCOUNTER — Encounter: Payer: Self-pay | Admitting: Adult Health

## 2020-07-27 NOTE — Progress Notes (Signed)
Location:  [facility]upational psychologist of Service:  SNF (31) Provider:   Cindi Carbon, ANP [place] [phone]  [name], DO  Patient Care Team: [name], DO as PCP - General (Geriatric Medicine)  Extended Emergency Contact Information Primary Emergency Contact: Alley,Kelly Address: [address]          [place], [place] [address] [name] of [place] Phone: [phone] Relation: Daughter Secondary Emergency Contact: Odle,[name] Address: [address] [place] [address] [place] of [place] Phone: [phone] Relation: Son  Code Status:  DNR Goals of care: Advanced Directive information Advanced Directives 03/26/2020  Does Patient Have a Medical Advance Directive? Yes  Type of Paramedic of [place];Living will;Out of facility DNR (pink MOST or yellow form)  Does patient want to make changes to medical advance directive? -  Copy of [place] in Chart? Yes - validated most recent copy scanned in chart (See row information)  Would patient like information on creating a medical advance directive? -  Pre-existing out of facility DNR order (yellow form or pink MOST form) Yellow form placed in chart (order not valid for inpatient use);Pink MOST form placed in chart (order not valid for inpatient use)     Chief Complaint  Patient presents with  . Medical Management of Chronic Issues    HPI:  Pt is a 80 y.o. male seen today for management of chronic diseases. There are no acute complaints regarding his care. He has advanced frontal lobe dementia. He continue with periods of lethargy followed by periods of alertness and resistance to personal care. Weight remains stable. Vitals are WNL, except his pulse runs slightly low at times 45-60 range. He is on a puree diet and tolerating well. No reports of coughing or choking.     Past Medical History:  Diagnosis Date   . Dementia ([place])   . Diabetes ([place])   . Heart disease   . Hyperlipidemia   . Hypotension    Past Surgical History:  Procedure Laterality Date  . APPENDECTOMY  1961  . CORONARY ANGIOPLASTY WITH STENT PLACEMENT  2016  . TONSILLECTOMY AND ADENOIDECTOMY    . TOTAL KNEE ARTHROPLASTY  2012    Allergies  Allergen Reactions  . Lortab [Hydrocodone-Acetaminophen] Other (See Comments)    Reaction:  Affected pts cognition   . Morphine And Related Other (See Comments)    Reaction:  Affected pts cognition     Outpatient Encounter Medications as of 07/22/2020  Medication Sig  . acetaminophen (TYLENOL) 325 MG tablet Take 325 mg by mouth 3 (three) times daily.  Marland Kitchen allopurinol (ZYLOPRIM) 100 MG tablet Take 1 tablet (100 mg total) by mouth daily.  . bisacodyl (DULCOLAX) 10 MG suppository Place 10 mg rectally daily as needed (constipation).   . carbamazepine (TEGRETOL) 200 MG tablet Take 200 mg by mouth every morning. 2 tablets (400 mg) at bedtime  . clopidogrel (PLAVIX) 75 MG tablet Take 1 tablet (75 mg total) by mouth daily.  Marland Kitchen gabapentin (NEURONTIN) 100 MG capsule Take 100 mg by mouth 2 (two) times daily.   . hydrOXYzine (ATARAX) 10 MG/5ML syrup Take 10 mg by mouth 2 (two) times daily as needed.  . polyethylene glycol (MIRALAX / GLYCOLAX) packet Take 17 g by mouth daily.  Marland Kitchen senna-docusate (SENOKOT-S) 8.6-50 MG tablet Take 1 tablet by mouth 2 (two) times daily.   No  facility-administered encounter medications on file as of 07/22/2020.    Review of Systems  Unable to perform ROS: Dementia    Immunization History  Administered Date(s) Administered  . Influenza, High Dose Seasonal PF 04/17/2019, 04/23/2020  . Influenza,inj,Quad PF,6+ Mos 04/23/2018  . Influenza-Unspecified 05/05/2010, 04/11/2013, 03/03/2015, 09/06/2016, 05/10/2017, 04/27/2020  . Moderna Sars-Covid-2 Vaccination 07/08/2019, 08/05/2019  . Pneumococcal Polysaccharide-23 12/25/2010  . Pneumococcal-Unspecified 02/28/2013  . Td  06/01/2017  . Tdap 03/22/2006  . Zoster 11/01/2012  . Zoster Recombinat (Shingrix) 05/25/2017, 09/25/2017   Pertinent  Health Maintenance Due  Topic Date Due  . INFLUENZA VACCINE  Completed  . PNA vac Low Risk Adult  Completed   Fall Risk  07/27/2020 05/08/2019 09/21/2018 05/29/2018 05/29/2017  Falls in the past year? 1 1 Exclusion - non ambulatory 0 No  Number falls in past yr: 0 1 - 0 -  Injury with Fall? 1 1 - 0 -  Risk for fall due to : History of fall(s);Medication side effect History of fall(s);Impaired balance/gait - - -  Follow up Falls evaluation completed Falls evaluation completed - - -   Functional Status Survey:    Vitals:   07/27/20 0833  Weight: 172 lb (78 kg)   Body mass index is 28.62 kg/m.  Wt Readings from Last 3 Encounters:  07/27/20 172 lb (78 kg)  06/14/20 173 lb 6.4 oz (78.7 kg)  05/13/20 174 lb (78.9 kg)    Physical Exam Vitals and nursing note reviewed.  Constitutional:      General: He is not in acute distress.    Appearance: He is not diaphoretic.  HENT:     Head: Normocephalic and atraumatic.  Neck:     Thyroid: No thyromegaly.     Vascular: No JVD.     Trachea: No tracheal deviation.  Cardiovascular:     Rate and Rhythm: Normal rate and regular rhythm.     Heart sounds: No murmur heard.   Pulmonary:     Effort: Pulmonary effort is normal. No respiratory distress.     Breath sounds: Normal breath sounds. No wheezing.  Abdominal:     General: Bowel sounds are normal. There is no distension.     Palpations: Abdomen is soft.     Tenderness: There is no abdominal tenderness.  Musculoskeletal:        General: No swelling, tenderness, deformity or signs of injury.     Right lower leg: No edema.     Left lower leg: No edema.     Comments: Decreased ROM to BUE and BLE  Lymphadenopathy:     Cervical: No cervical adenopathy.  Skin:    General: Skin is warm and dry.  Neurological:     Mental Status:Baseline  Psychiatric:     Comments:  asleep     Labs reviewed: Recent Labs    12/21/19 2333  NA 139  K 4.8  CL 104  CO2 27  GLUCOSE 173*  BUN 18  CREATININE 1.20  CALCIUM 8.9   Recent Labs    12/21/19 2333  AST 24  ALT 17  ALKPHOS 120  BILITOT 0.4  PROT 6.6  ALBUMIN 3.7   Recent Labs    12/21/19 2333  WBC 7.8  NEUTROABS 5.4  HGB 13.1  HCT 40.3  MCV 95.5  PLT 219   Lab Results  Component Value Date   TSH 2.45 01/09/2019   Lab Results  Component Value Date   HGBA1C 6.7 01/09/2019   Lab Results  Component  Value Date   CHOL 124 10/26/2016   HDL 40 10/26/2016   LDLCALC 62 10/26/2016   TRIG 111 10/26/2016    Significant Diagnostic Results in last 30 days:  No results found.  Assessment/Plan  1. Benign hypertensive heart disease without heart failure BPs are well controlled Remains on plavix due to prior cardiacstent  2. Oropharyngeal dysphagia Continues on a puree diet with asp prec in place At risk for aspiration due to advancing dementia. DNR in place  3. 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 Casimiro Needle  4. Stage 3b chronic kidney disease ([place]) Continue to periodically monitor BMP and avoid nephrotoxic agents    Family/ staff Communication: discussed with nurse.    Labs/tests ordered:  NA

## 2020-08-19 ENCOUNTER — Encounter: Payer: Self-pay | Admitting: Adult Health

## 2020-08-19 ENCOUNTER — Non-Acute Institutional Stay (SKILLED_NURSING_FACILITY): Payer: PPO | Admitting: Adult Health

## 2020-08-19 DIAGNOSIS — M170 Bilateral primary osteoarthritis of knee: Secondary | ICD-10-CM

## 2020-08-19 DIAGNOSIS — R1312 Dysphagia, oropharyngeal phase: Secondary | ICD-10-CM

## 2020-08-19 DIAGNOSIS — N400 Enlarged prostate without lower urinary tract symptoms: Secondary | ICD-10-CM | POA: Diagnosis not present

## 2020-08-19 DIAGNOSIS — F0280 Dementia in other diseases classified elsewhere without behavioral disturbance: Secondary | ICD-10-CM | POA: Diagnosis not present

## 2020-08-19 DIAGNOSIS — G3109 Other frontotemporal dementia: Secondary | ICD-10-CM

## 2020-08-19 DIAGNOSIS — I119 Hypertensive heart disease without heart failure: Secondary | ICD-10-CM | POA: Diagnosis not present

## 2020-08-19 NOTE — Progress Notes (Signed)
Location:  [facility] Room Number: 114-A Place of Service:  SNF [id number]) Provider:  Cindi Carbon, NP    Patient Care Team: [name], DO as PCP - General (Geriatric Medicine)  Extended Emergency Contact Information Primary Emergency Contact: Alley,Kelly Address: [address]          [place], [place] [address] [name] of [place] Phone: [phone] Relation: Daughter Secondary Emergency Contact: Stjulien,[name] Address: [address] [place] [address] [place] of [place] Phone: [phone] Relation: Son  Code Status:  DNR  Goals of care: Advanced Directive information Advanced Directives 08/19/2020  Does Patient Have a Medical Advance Directive? Yes  Type of Advance Directive Out of facility DNR (pink MOST or yellow form)  Does patient want to make changes to medical advance directive? No - Patient declined  Copy of [place] in Chart? -  Would patient like information on creating a medical advance directive? -  Pre-existing out of facility DNR order (yellow form or pink MOST form) Pink MOST form placed in chart (order not valid for inpatient use);Yellow form placed in chart (order not valid for inpatient use)     Chief Complaint  Patient presents with  . Medical Management of Chronic Issues    Routine Visit     HPI:  Pt is a 80 y.o. male seen today for medical management of chronic diseases.    Nurse reports resident has a mild cough with meals and increased saliva/sputum in the his throat. No fever, decreased 02 sats, or difficulty breathing.   Currently on D1 diet with NTL. No choking episodes just slight cough  Dementia is severe and requires assistance with all ADLs. Incontinent of B/B Resists personal care. Has periods of lethargy alternating with periods of alertness   Weight is down 5 lbs in the past two months Wt Readings from Last 3 Encounters:  08/19/20 168 lb (76.2 kg)   07/27/20 172 lb (78 kg)  06/14/20 173 lb 6.4 oz (78.7 kg)   Takes tylenol for perceived joint pain which is difficult to assess due to the severity of his dementia. No grimacing or high bp noted.   Past Medical History:  Diagnosis Date  . Dementia ([place])   . Diabetes ([place])   . Heart disease   . Hyperlipidemia   . Hypotension    Past Surgical History:  Procedure Laterality Date  . APPENDECTOMY  1961  . CORONARY ANGIOPLASTY WITH STENT PLACEMENT  2016  . TONSILLECTOMY AND ADENOIDECTOMY    . TOTAL KNEE ARTHROPLASTY  2012    Allergies  Allergen Reactions  . Lortab [Hydrocodone-Acetaminophen] Other (See Comments)    Reaction:  Affected pts cognition   . Morphine And Related Other (See Comments)    Reaction:  Affected pts cognition     Outpatient Encounter Medications as of 08/19/2020  Medication Sig  . acetaminophen (TYLENOL) 325 MG tablet Take 325 mg by mouth 3 (three) times daily.  Marland Kitchen allopurinol (ZYLOPRIM) 100 MG tablet Take 1 tablet (100 mg total) by mouth daily.  . bisacodyl (DULCOLAX) 10 MG suppository Place 10 mg rectally daily as needed (constipation).   . carbamazepine (TEGRETOL) 200 MG tablet Take 200 mg by mouth every morning. 2 tablets (400 mg) at bedtime  . clopidogrel (PLAVIX) 75 MG tablet Take 1 tablet (75 mg total) by mouth daily.  Marland Kitchen gabapentin (NEURONTIN) 100 MG capsule Take 100 mg by mouth  2 (two) times daily.   . polyethylene glycol (MIRALAX / GLYCOLAX) packet Take 17 g by mouth daily.  Marland Kitchen senna-docusate (SENOKOT-S) 8.6-50 MG tablet Take 1 tablet by mouth 2 (two) times daily.  . [DISCONTINUED] hydrOXYzine (ATARAX) 10 MG/5ML syrup Take 10 mg by mouth 2 (two) times daily as needed.   No facility-administered encounter medications on file as of 08/19/2020.    Review of Systems  Unable to perform ROS: Dementia    Immunization History  Administered Date(s) Administered  . Influenza, High Dose Seasonal PF 04/17/2019, 04/23/2020  . Influenza,inj,Quad PF,6+ Mos  04/23/2018  . Influenza-Unspecified 05/05/2010, 04/11/2013, 03/03/2015, 09/06/2016, 05/10/2017, 04/27/2020  . Moderna Sars-Covid-2 Vaccination 07/08/2019, 08/05/2019  . Pneumococcal Polysaccharide-23 12/25/2010  . Pneumococcal-Unspecified 02/28/2013  . Td 06/01/2017  . Tdap 03/22/2006  . Zoster 11/01/2012  . Zoster Recombinat (Shingrix) 05/25/2017, 09/25/2017   Pertinent  Health Maintenance Due  Topic Date Due  . INFLUENZA VACCINE  Completed  . PNA vac Low Risk Adult  Completed   Fall Risk  07/27/2020 05/08/2019 09/21/2018 05/29/2018 05/29/2017  Falls in the past year? 1 1 Exclusion - non ambulatory 0 No  Number falls in past yr: 0 1 - 0 -  Injury with Fall? 1 1 - 0 -  Risk for fall due to : History of fall(s);Medication side effect History of fall(s);Impaired balance/gait - - -  Follow up Falls evaluation completed Falls evaluation completed - - -   Functional Status Survey:    Vitals:   08/19/20 1458  BP: 134/68  Pulse: 65  Resp: 16  Temp: 97.8 F (36.6 C)  SpO2: 90%  Weight: 168 lb (76.2 kg)  Height: 5\' 5"  (1.651 m)   Body mass index is 27.96 kg/m. Physical Exam Vitals and nursing note reviewed.  Constitutional:      General: He is not in acute distress.    Appearance: He is not diaphoretic.     Comments: Asleep, arouses to physical stim  HENT:     Head: Normocephalic and atraumatic.     Nose: Nose normal. No congestion.     Mouth/Throat:     Comments: refused Neck:     Thyroid: No thyromegaly.     Vascular: No JVD.     Trachea: No tracheal deviation.  Cardiovascular:     Rate and Rhythm: Normal rate and regular rhythm.     Heart sounds: No murmur heard.   Pulmonary:     Effort: Pulmonary effort is normal. No respiratory distress.     Breath sounds: Normal breath sounds. No wheezing.  Abdominal:     General: Bowel sounds are normal. There is no distension.     Palpations: Abdomen is soft.     Tenderness: There is no abdominal tenderness.   Lymphadenopathy:     Cervical: No cervical adenopathy.  Skin:    General: Skin is warm and dry.  Neurological:     General: No focal deficit present.     Mental Status: Mental status is at baseline.  Psychiatric:        Mood and Affect: Mood and affect normal.     Labs reviewed: Recent Labs    12/21/19 2333  NA 139  K 4.8  CL 104  CO2 27  GLUCOSE 173*  BUN 18  CREATININE 1.20  CALCIUM 8.9   Recent Labs    12/21/19 2333  AST 24  ALT 17  ALKPHOS 120  BILITOT 0.4  PROT 6.6  ALBUMIN 3.7   Recent Labs  12/21/19 2333  WBC 7.8  NEUTROABS 5.4  HGB 13.1  HCT 40.3  MCV 95.5  PLT 219   Lab Results  Component Value Date   TSH 2.45 01/09/2019   Lab Results  Component Value Date   HGBA1C 6.7 01/09/2019   Lab Results  Component Value Date   CHOL 124 10/26/2016   HDL 40 10/26/2016   LDLCALC 62 10/26/2016   TRIG 111 10/26/2016    Significant Diagnostic Results in last 30 days:  No results found.  Assessment/Plan  1. Oropharyngeal dysphagia Continue D1 diet with NTL Asp prec 1:1 supervision Add duonebs bid and q 6 prn   2. 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 Benign hypertensive heart disease without heart failure Continues on plavix bp controlled Not monitoring LDL due to age/dementia  4. Bilateral primary osteoarthritis of knee No perceived signs of pain Continue Tylenol 650 mg three times daily   5. BPH without obstruction/lower urinary tract symptoms No current symptoms    Family/ staff Communication: discussed with his nurse Misti  Labs/tests ordered:  NA

## 2020-08-20 ENCOUNTER — Encounter: Payer: Self-pay | Admitting: Adult Health

## 2020-08-23 ENCOUNTER — Encounter: Payer: Self-pay | Admitting: Internal Medicine

## 2020-09-16 ENCOUNTER — Encounter: Payer: Self-pay | Admitting: Adult Health

## 2020-09-16 ENCOUNTER — Non-Acute Institutional Stay (SKILLED_NURSING_FACILITY): Payer: PPO | Admitting: Adult Health

## 2020-09-16 DIAGNOSIS — F0280 Dementia in other diseases classified elsewhere without behavioral disturbance: Secondary | ICD-10-CM

## 2020-09-16 DIAGNOSIS — R1312 Dysphagia, oropharyngeal phase: Secondary | ICD-10-CM

## 2020-09-16 DIAGNOSIS — E1149 Type 2 diabetes mellitus with other diabetic neurological complication: Secondary | ICD-10-CM

## 2020-09-16 DIAGNOSIS — K5901 Slow transit constipation: Secondary | ICD-10-CM

## 2020-09-16 DIAGNOSIS — E1142 Type 2 diabetes mellitus with diabetic polyneuropathy: Secondary | ICD-10-CM

## 2020-09-16 DIAGNOSIS — G3109 Other frontotemporal dementia: Secondary | ICD-10-CM

## 2020-09-16 DIAGNOSIS — E790 Hyperuricemia without signs of inflammatory arthritis and tophaceous disease: Secondary | ICD-10-CM | POA: Diagnosis not present

## 2020-09-16 LAB — HM DIABETES FOOT EXAM

## 2020-09-16 NOTE — Progress Notes (Signed)
Location:  Occupational psychologist of Service:  SNF (31) Provider:   Cindi Carbon, ANP [place] [phone]   [name], DO  Patient Care Team: [name], DO as PCP - General (Geriatric Medicine)  Extended Emergency Contact Information Primary Emergency Contact: Alley,Kelly Address: [address]          [place], [place] [address] [name] of [place] Phone: [phone] Relation: Daughter Secondary Emergency Contact: Kawabata,[name] Address: [address] [place] [address] [place] of [place] Phone: [phone] Relation: Son  Code Status:  DNR Goals of care: Advanced Directive information Advanced Directives 09/16/2020  Does Patient Have a Medical Advance Directive? No;Yes  Type of Paramedic of [place];Living will;Out of facility DNR (pink MOST or yellow form)  Does patient want to make changes to medical advance directive? No - Patient declined  Copy of [place] in Chart? Yes - validated most recent copy scanned in chart (See row information)  Would patient like information on creating a medical advance directive? -  Pre-existing out of facility DNR order (yellow form or pink MOST form) Pink MOST form placed in chart (order not valid for inpatient use);Yellow form placed in chart (order not valid for inpatient use)     Chief Complaint  Patient presents with  . Medical Management of Chronic Issues    HPI:  Pt is a 80 y.o. male seen today for medical management of chronic diseases.    Dysphagia: coughs after meals at times. Currently taking duonebs which seems to help. No fever or sputum production noted.    Dementia: hits and moans during care. Appears to have hallucinations as he grabs for things that are not there.    BP controlled  Bowels moving well  No issues with foot pain or ulceration.    Past Medical History:  Diagnosis Date  .  Dementia ([place])   . Diabetes ([place])   . Heart disease   . Hyperlipidemia   . Hypotension    Past Surgical History:  Procedure Laterality Date  . APPENDECTOMY  1961  . CORONARY ANGIOPLASTY WITH STENT PLACEMENT  2016  . TONSILLECTOMY AND ADENOIDECTOMY    . TOTAL KNEE ARTHROPLASTY  2012    Allergies  Allergen Reactions  . Lortab [Hydrocodone-Acetaminophen] Other (See Comments)    Reaction:  Affected pts cognition   . Morphine And Related Other (See Comments)    Reaction:  Affected pts cognition     Outpatient Encounter Medications as of 09/16/2020  Medication Sig  . acetaminophen (TYLENOL) 325 MG tablet Take 650 mg by mouth 3 (three) times daily.  Marland Kitchen allopurinol (ZYLOPRIM) 100 MG tablet Take 1 tablet (100 mg total) by mouth daily.  . bisacodyl (DULCOLAX) 10 MG suppository Place 10 mg rectally daily as needed (constipation).   . carbamazepine (TEGRETOL) 200 MG tablet Take 200 mg by mouth every morning. 2 tablets (400 mg) at bedtime  . clopidogrel (PLAVIX) 75 MG tablet Take 1 tablet (75 mg total) by mouth daily.  Marland Kitchen gabapentin (NEURONTIN) 100 MG capsule Take 100 mg by mouth 2 (two) times daily.   Marland Kitchen ipratropium-albuterol (DUONEB) 0.5-2.5 (3) MG/3ML SOLN Take 3 mLs by nebulization 2 (two) times daily. And q 6 prn sob or wheeze  . polyethylene glycol (MIRALAX / GLYCOLAX) packet Take 17 g by mouth daily.  Marland Kitchen senna-docusate (SENOKOT-S) 8.6-50 MG tablet Take 1 tablet  by mouth 2 (two) times daily.   No facility-administered encounter medications on file as of 09/16/2020.    Review of Systems  Unable to perform ROS: Dementia    Immunization History  Administered Date(s) Administered  . Influenza, High Dose Seasonal PF 04/17/2019, 04/23/2020  . Influenza,inj,Quad PF,6+ Mos 04/23/2018  . Influenza-Unspecified 05/05/2010, 04/11/2013, 03/03/2015, 09/06/2016, 05/10/2017, 04/27/2020  . Moderna SARS-COV2 Booster Vaccination 05/13/2020  . Moderna Sars-Covid-2 Vaccination 07/08/2019, 08/05/2019  .  Pneumococcal Polysaccharide-23 12/25/2010  . Pneumococcal-Unspecified 02/28/2013  . Td 06/01/2017  . Tdap 03/22/2006  . Zoster 11/01/2012  . Zoster Recombinat (Shingrix) 05/25/2017, 09/25/2017   Pertinent  Health Maintenance Due  Topic Date Due  . INFLUENZA VACCINE  Completed  . PNA vac Low Risk Adult  Completed   Fall Risk  07/27/2020 05/08/2019 09/21/2018 05/29/2018 05/29/2017  Falls in the past year? 1 1 Exclusion - non ambulatory 0 No  Number falls in past yr: 0 1 - 0 -  Injury with Fall? 1 1 - 0 -  Risk for fall due to : History of fall(s);Medication side effect History of fall(s);Impaired balance/gait - - -  Follow up Falls evaluation completed Falls evaluation completed - - -   Functional Status Survey:    Vitals:   09/16/20 1633  Weight: 167 lb 6.4 oz (75.9 kg)   Body mass index is 27.86 kg/m.  Wt Readings from Last 3 Encounters:  09/16/20 167 lb 6.4 oz (75.9 kg)  08/19/20 168 lb (76.2 kg)  07/27/20 172 lb (78 kg)    Physical Exam Vitals and nursing note reviewed.  Constitutional:      General: He is not in acute distress.    Appearance: He is not diaphoretic.  HENT:     Head: Normocephalic and atraumatic.  Neck:     Thyroid: No thyromegaly.     Vascular: No JVD.     Trachea: No tracheal deviation.  Cardiovascular:     Rate and Rhythm: Regular rhythm. Bradycardia present.     Pulses:          Dorsalis pedis pulses are 1+ on the right side and 1+ on the left side.     Heart sounds: No murmur heard.   Pulmonary:     Effort: Pulmonary effort is normal. No respiratory distress.     Breath sounds: Normal breath sounds. No wheezing.  Abdominal:     General: Bowel sounds are normal. There is no distension.     Palpations: Abdomen is soft.     Tenderness: There is no abdominal tenderness.  Musculoskeletal:     Right lower leg: No edema.     Left lower leg: No edema.     Right foot: Normal range of motion. No deformity.     Left foot: Normal range of motion.  No deformity.  Feet:     Right foot:     Protective Sensation: 0 sites tested. 0 sites sensed.     Skin integrity: Skin integrity normal.     Left foot:     Protective Sensation: 0 sites tested. 0 sites sensed.     Skin integrity: Skin integrity normal.  Lymphadenopathy:     Cervical: No cervical adenopathy.  Skin:    General: Skin is warm and dry.  Neurological:     Mental Status: He is alert. Mental status is at baseline.     Comments: Not able to f/c. Stares to the left. Not tracking.      Labs reviewed: Recent Labs  12/21/19 2333  NA 139  K 4.8  CL 104  CO2 27  GLUCOSE 173*  BUN 18  CREATININE 1.20  CALCIUM 8.9   Recent Labs    12/21/19 2333  AST 24  ALT 17  ALKPHOS 120  BILITOT 0.4  PROT 6.6  ALBUMIN 3.7   Recent Labs    12/21/19 2333  WBC 7.8  NEUTROABS 5.4  HGB 13.1  HCT 40.3  MCV 95.5  PLT 219   Lab Results  Component Value Date   TSH 2.45 01/09/2019   Lab Results  Component Value Date   HGBA1C 6.7 01/09/2019   Lab Results  Component Value Date   CHOL 124 10/26/2016   HDL 40 10/26/2016   LDLCALC 62 10/26/2016   TRIG 111 10/26/2016    Significant Diagnostic Results in last 30 days:  No results found.  Assessment/Plan 1. 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  DNR in place. Most form indicates limited intervention  2. Diabetic peripheral neuropathy associated with type 2 diabetes mellitus ([facility]) No current foot issues or pain Continue neurontin 100 mg bid which also may help with behaviors   3. Type 2 diabetes mellitus with neurological complications [facility]) Lab Results  Component Value Date   HGBA1C 6.7 01/09/2019  Diet controlled Will check annually (has been stable for quite some time)  4. Oropharyngeal dysphagia Continue puree diet and NTL  Asp prec  1:1 supervision  Continue duonebs bid and prn   5. Hyperuricemia No current issues Continue  allopurinol 100 mg qd   6. Slow transit constipation Continue miralax 17 grams qd and senokot s 1 tab bid     Family/ staff Communication: Nurse  Labs/tests ordered:  NA

## 2020-10-21 ENCOUNTER — Emergency Department (HOSPITAL_COMMUNITY): Payer: PPO

## 2020-10-21 ENCOUNTER — Encounter (HOSPITAL_COMMUNITY): Payer: Self-pay

## 2020-10-21 ENCOUNTER — Other Ambulatory Visit: Payer: Self-pay

## 2020-10-21 ENCOUNTER — Inpatient Hospital Stay (HOSPITAL_COMMUNITY)
Admission: EM | Admit: 2020-10-21 | Discharge: 2020-10-25 | DRG: 871 | Disposition: A | Discharge status: Skilled Nursing Facility | Payer: PPO | Source: Skilled Nursing Facility | Attending: Internal Medicine | Admitting: Internal Medicine

## 2020-10-21 DIAGNOSIS — F0150 Vascular dementia without behavioral disturbance: Secondary | ICD-10-CM | POA: Diagnosis present

## 2020-10-21 DIAGNOSIS — Z833 Family history of diabetes mellitus: Secondary | ICD-10-CM | POA: Diagnosis not present

## 2020-10-21 DIAGNOSIS — Z85828 Personal history of other malignant neoplasm of skin: Secondary | ICD-10-CM | POA: Diagnosis not present

## 2020-10-21 DIAGNOSIS — J9601 Acute respiratory failure with hypoxia: Secondary | ICD-10-CM | POA: Diagnosis present

## 2020-10-21 DIAGNOSIS — B961 Klebsiella pneumoniae [K. pneumoniae] as the cause of diseases classified elsewhere: Secondary | ICD-10-CM | POA: Diagnosis present

## 2020-10-21 DIAGNOSIS — E1122 Type 2 diabetes mellitus with diabetic chronic kidney disease: Secondary | ICD-10-CM | POA: Diagnosis not present

## 2020-10-21 DIAGNOSIS — R112 Nausea with vomiting, unspecified: Secondary | ICD-10-CM | POA: Diagnosis not present

## 2020-10-21 DIAGNOSIS — G9341 Metabolic encephalopathy: Secondary | ICD-10-CM

## 2020-10-21 DIAGNOSIS — A419 Sepsis, unspecified organism: Principal | ICD-10-CM

## 2020-10-21 DIAGNOSIS — I129 Hypertensive chronic kidney disease with stage 1 through stage 4 chronic kidney disease, or unspecified chronic kidney disease: Secondary | ICD-10-CM | POA: Diagnosis not present

## 2020-10-21 DIAGNOSIS — B9620 Unspecified Escherichia coli [E. coli] as the cause of diseases classified elsewhere: Secondary | ICD-10-CM | POA: Diagnosis present

## 2020-10-21 DIAGNOSIS — R404 Transient alteration of awareness: Secondary | ICD-10-CM | POA: Diagnosis not present

## 2020-10-21 DIAGNOSIS — E785 Hyperlipidemia, unspecified: Secondary | ICD-10-CM | POA: Diagnosis present

## 2020-10-21 DIAGNOSIS — L8990 Pressure ulcer of unspecified site, unspecified stage: Secondary | ICD-10-CM | POA: Insufficient documentation

## 2020-10-21 DIAGNOSIS — N1831 Chronic kidney disease, stage 3a: Secondary | ICD-10-CM

## 2020-10-21 DIAGNOSIS — Z79899 Other long term (current) drug therapy: Secondary | ICD-10-CM

## 2020-10-21 DIAGNOSIS — R6520 Severe sepsis without septic shock: Secondary | ICD-10-CM | POA: Diagnosis not present

## 2020-10-21 DIAGNOSIS — E1142 Type 2 diabetes mellitus with diabetic polyneuropathy: Secondary | ICD-10-CM | POA: Diagnosis not present

## 2020-10-21 DIAGNOSIS — G928 Other toxic encephalopathy: Secondary | ICD-10-CM | POA: Diagnosis not present

## 2020-10-21 DIAGNOSIS — R1310 Dysphagia, unspecified: Secondary | ICD-10-CM | POA: Diagnosis not present

## 2020-10-21 DIAGNOSIS — Z743 Need for continuous supervision: Secondary | ICD-10-CM | POA: Diagnosis not present

## 2020-10-21 DIAGNOSIS — Z7902 Long term (current) use of antithrombotics/antiplatelets: Secondary | ICD-10-CM

## 2020-10-21 DIAGNOSIS — Z87891 Personal history of nicotine dependence: Secondary | ICD-10-CM

## 2020-10-21 DIAGNOSIS — R402431 Glasgow coma scale score 3-8, in the field [EMT or ambulance]: Secondary | ICD-10-CM | POA: Diagnosis not present

## 2020-10-21 DIAGNOSIS — R0902 Hypoxemia: Secondary | ICD-10-CM | POA: Diagnosis not present

## 2020-10-21 DIAGNOSIS — E1165 Type 2 diabetes mellitus with hyperglycemia: Secondary | ICD-10-CM | POA: Diagnosis not present

## 2020-10-21 DIAGNOSIS — Z885 Allergy status to narcotic agent status: Secondary | ICD-10-CM

## 2020-10-21 DIAGNOSIS — R339 Retention of urine, unspecified: Secondary | ICD-10-CM | POA: Diagnosis not present

## 2020-10-21 DIAGNOSIS — I440 Atrioventricular block, first degree: Secondary | ICD-10-CM | POA: Diagnosis not present

## 2020-10-21 DIAGNOSIS — J690 Pneumonitis due to inhalation of food and vomit: Secondary | ICD-10-CM | POA: Diagnosis not present

## 2020-10-21 DIAGNOSIS — N390 Urinary tract infection, site not specified: Secondary | ICD-10-CM | POA: Diagnosis not present

## 2020-10-21 DIAGNOSIS — Z87440 Personal history of urinary (tract) infections: Secondary | ICD-10-CM

## 2020-10-21 DIAGNOSIS — R279 Unspecified lack of coordination: Secondary | ICD-10-CM | POA: Diagnosis not present

## 2020-10-21 DIAGNOSIS — I2510 Atherosclerotic heart disease of native coronary artery without angina pectoris: Secondary | ICD-10-CM | POA: Diagnosis present

## 2020-10-21 DIAGNOSIS — N183 Chronic kidney disease, stage 3 unspecified: Secondary | ICD-10-CM | POA: Diagnosis present

## 2020-10-21 DIAGNOSIS — R1110 Vomiting, unspecified: Secondary | ICD-10-CM | POA: Diagnosis not present

## 2020-10-21 DIAGNOSIS — Z66 Do not resuscitate: Secondary | ICD-10-CM | POA: Diagnosis present

## 2020-10-21 DIAGNOSIS — Z955 Presence of coronary angioplasty implant and graft: Secondary | ICD-10-CM

## 2020-10-21 DIAGNOSIS — I4430 Unspecified atrioventricular block: Secondary | ICD-10-CM | POA: Diagnosis not present

## 2020-10-21 DIAGNOSIS — N400 Enlarged prostate without lower urinary tract symptoms: Secondary | ICD-10-CM | POA: Diagnosis present

## 2020-10-21 DIAGNOSIS — E1149 Type 2 diabetes mellitus with other diabetic neurological complication: Secondary | ICD-10-CM | POA: Diagnosis not present

## 2020-10-21 DIAGNOSIS — R509 Fever, unspecified: Secondary | ICD-10-CM | POA: Diagnosis not present

## 2020-10-21 DIAGNOSIS — Z20822 Contact with and (suspected) exposure to covid-19: Secondary | ICD-10-CM | POA: Diagnosis not present

## 2020-10-21 DIAGNOSIS — I10 Essential (primary) hypertension: Secondary | ICD-10-CM | POA: Diagnosis not present

## 2020-10-21 DIAGNOSIS — R410 Disorientation, unspecified: Secondary | ICD-10-CM | POA: Diagnosis not present

## 2020-10-21 DIAGNOSIS — R918 Other nonspecific abnormal finding of lung field: Secondary | ICD-10-CM | POA: Diagnosis not present

## 2020-10-21 LAB — COMPREHENSIVE METABOLIC PANEL
ALT: 12 U/L (ref 0–44)
AST: 19 U/L (ref 15–41)
Albumin: 3.7 g/dL (ref 3.5–5.0)
Alkaline Phosphatase: 120 U/L (ref 38–126)
Anion gap: 10 (ref 5–15)
BUN: 27 mg/dL — ABNORMAL HIGH (ref 8–23)
CO2: 22 mmol/L (ref 22–32)
Calcium: 8.1 mg/dL — ABNORMAL LOW (ref 8.9–10.3)
Chloride: 108 mmol/L (ref 98–111)
Creatinine, Ser: 1.34 mg/dL — ABNORMAL HIGH (ref 0.61–1.24)
GFR, Estimated: 54 mL/min — ABNORMAL LOW (ref >60)
Glucose, Bld: 245 mg/dL — ABNORMAL HIGH (ref 70–99)
Potassium: 4.4 mmol/L (ref 3.5–5.1)
Sodium: 140 mmol/L (ref 135–145)
Total Bilirubin: 1.1 mg/dL (ref 0.3–1.2)
Total Protein: 6.5 g/dL (ref 6.5–8.1)

## 2020-10-21 LAB — URINALYSIS, ROUTINE W REFLEX MICROSCOPIC
Bilirubin Urine: NEGATIVE
Glucose, UA: NEGATIVE mg/dL
Ketones, ur: 5 mg/dL — AB
Nitrite: NEGATIVE
Protein, ur: 30 mg/dL — AB
Specific Gravity, Urine: 1.018 (ref 1.005–1.030)
WBC, UA: >50 WBC/hpf — ABNORMAL HIGH (ref 0–5)
pH: 5 (ref 5.0–8.0)

## 2020-10-21 LAB — CBC WITH DIFFERENTIAL/PLATELET
Abs Immature Granulocytes: 0.04 10*3/uL (ref 0.00–0.07)
Basophils Absolute: 0 10*3/uL (ref 0.0–0.1)
Basophils Relative: 0 %
Eosinophils Absolute: 0 10*3/uL (ref 0.0–0.5)
Eosinophils Relative: 0 %
HCT: 39.8 % (ref 39.0–52.0)
Hemoglobin: 12.8 g/dL — ABNORMAL LOW (ref 13.0–17.0)
Immature Granulocytes: 0 %
Lymphocytes Relative: 3 %
Lymphs Abs: 0.3 10*3/uL — ABNORMAL LOW (ref 0.7–4.0)
MCH: 31.5 pg (ref 26.0–34.0)
MCHC: 32.2 g/dL (ref 30.0–36.0)
MCV: 98 fL (ref 80.0–100.0)
Monocytes Absolute: 0.4 10*3/uL (ref 0.1–1.0)
Monocytes Relative: 4 %
Neutro Abs: 9.5 10*3/uL — ABNORMAL HIGH (ref 1.7–7.7)
Neutrophils Relative %: 93 %
Platelets: 214 10*3/uL (ref 150–400)
RBC: 4.06 MIL/uL — ABNORMAL LOW (ref 4.22–5.81)
RDW: 13.7 % (ref 11.5–15.5)
WBC: 10.4 10*3/uL (ref 4.0–10.5)
nRBC: 0 % (ref 0.0–0.2)

## 2020-10-21 LAB — GLUCOSE, CAPILLARY
Glucose-Capillary: 215 mg/dL — ABNORMAL HIGH (ref 70–99)
Glucose-Capillary: 124 mg/dL — ABNORMAL HIGH (ref 70–99)
Glucose-Capillary: 120 mg/dL — ABNORMAL HIGH (ref 70–99)

## 2020-10-21 LAB — HEMOGLOBIN A1C
Hgb A1c MFr Bld: 7.1 % — ABNORMAL HIGH (ref 4.8–5.6)
Mean Plasma Glucose: 157.07 mg/dL

## 2020-10-21 LAB — LACTIC ACID, PLASMA
Lactic Acid, Venous: 2.1 mmol/L (ref 0.5–1.9)
Lactic Acid, Venous: 2.7 mmol/L (ref 0.5–1.9)
Lactic Acid, Venous: 2.4 mmol/L (ref 0.5–1.9)

## 2020-10-21 LAB — MAGNESIUM: Magnesium: 1.9 mg/dL (ref 1.7–2.4)

## 2020-10-21 LAB — PROCALCITONIN: Procalcitonin: 1.75 ng/mL

## 2020-10-21 LAB — PROTIME-INR
INR: 1 (ref 0.8–1.2)
Prothrombin Time: 13.7 seconds (ref 11.4–15.2)

## 2020-10-21 LAB — SARS CORONAVIRUS 2 (TAT 6-24 HRS): SARS Coronavirus 2: NEGATIVE

## 2020-10-21 LAB — MRSA PCR SCREENING: MRSA by PCR: NEGATIVE

## 2020-10-21 MED ORDER — SODIUM CHLORIDE 0.9 % IV SOLN
500.0000 mg | INTRAVENOUS | Status: DC
  Administered 2020-10-21: 500 mg via INTRAVENOUS
  Filled 2020-10-21: qty 500

## 2020-10-21 MED ORDER — SODIUM CHLORIDE 0.9 % IV SOLN
3.0000 g | Freq: Four times a day (QID) | INTRAVENOUS | Status: DC
  Administered 2020-10-21 – 2020-10-23 (×7): 3 g via INTRAVENOUS
  Filled 2020-10-21: qty 3
  Filled 2020-10-21: qty 8
  Filled 2020-10-21 (×2): qty 3
  Filled 2020-10-21: qty 8
  Filled 2020-10-21: qty 3
  Filled 2020-10-21 (×2): qty 8

## 2020-10-21 MED ORDER — INSULIN ASPART 100 UNIT/ML ~~LOC~~ SOLN: 0.0000 [IU] | Freq: Three times a day (TID) | SUBCUTANEOUS | Status: DC

## 2020-10-21 MED ORDER — INSULIN ASPART 100 UNIT/ML ~~LOC~~ SOLN
0.0000 [IU] | Freq: Four times a day (QID) | SUBCUTANEOUS | Status: DC
  Administered 2020-10-21: 2 [IU] via SUBCUTANEOUS
  Filled 2020-10-21: qty 0.06

## 2020-10-21 MED ORDER — ACETAMINOPHEN 650 MG RE SUPP: 650.0000 mg | Freq: Four times a day (QID) | RECTAL | Status: DC | PRN

## 2020-10-21 MED ORDER — LACTATED RINGERS IV BOLUS
1000.0000 mL | Freq: Once | INTRAVENOUS | Status: AC
  Administered 2020-10-21: 1000 mL via INTRAVENOUS

## 2020-10-21 MED ORDER — PIPERACILLIN-TAZOBACTAM 3.375 G IVPB
3.3750 g | Freq: Three times a day (TID) | INTRAVENOUS | Status: DC
  Administered 2020-10-21 (×2): 3.375 g via INTRAVENOUS
  Filled 2020-10-21 (×3): qty 50

## 2020-10-21 MED ORDER — LACTATED RINGERS IV SOLN: INTRAVENOUS | Status: AC

## 2020-10-21 MED ORDER — CARBAMAZEPINE 200 MG PO TABS: 200.0000 mg | ORAL_TABLET | ORAL | Status: DC

## 2020-10-21 MED ORDER — SODIUM CHLORIDE 0.9 % IV SOLN
2.0000 g | Freq: Once | INTRAVENOUS | Status: AC
  Administered 2020-10-21: 2 g via INTRAVENOUS
  Filled 2020-10-21: qty 20

## 2020-10-21 MED ORDER — ACETAMINOPHEN 500 MG PO TABS
1000.0000 mg | ORAL_TABLET | Freq: Once | ORAL | Status: DC
  Filled 2020-10-21: qty 2

## 2020-10-21 MED ORDER — ORAL CARE MOUTH RINSE
15.0000 mL | Freq: Two times a day (BID) | OROMUCOSAL | Status: DC
  Administered 2020-10-21 – 2020-10-24 (×7): 15 mL via OROMUCOSAL

## 2020-10-21 MED ORDER — SODIUM CHLORIDE 0.9 % IV SOLN: INTRAVENOUS | Status: AC

## 2020-10-21 MED ORDER — RESOURCE THICKENUP CLEAR PO POWD
ORAL | Status: DC | PRN
  Filled 2020-10-21: qty 125

## 2020-10-21 MED ORDER — ONDANSETRON HCL 4 MG/2ML IJ SOLN: 4.0000 mg | Freq: Four times a day (QID) | INTRAMUSCULAR | Status: DC | PRN

## 2020-10-21 MED ORDER — SODIUM CHLORIDE 0.9 % IV BOLUS
500.0000 mL | Freq: Once | INTRAVENOUS | Status: AC
  Administered 2020-10-21: 500 mL via INTRAVENOUS

## 2020-10-21 MED ORDER — ACETAMINOPHEN 325 MG PO TABS: 650.0000 mg | ORAL_TABLET | Freq: Four times a day (QID) | ORAL | Status: DC | PRN

## 2020-10-21 NOTE — NC FL2 (Signed)
[place] LEVEL OF CARE SCREENING TOOL     IDENTIFICATION  Patient Name: Jose Castillo Birthdate: 12/14/1940 Sex: male Admission Date (Current Location): 10/21/2020  [facility] and [place] Number:  Herbalist and Address:  [facility],  [place], [place]      Provider Number: [id number]  Attending Physician Name and Address:  Cherene Altes, MD  Relative Name and Phone Number:  Claiborne Billings dtr [phone]    Current Level of Care: Hospital Recommended Level of Care: SNF Prior Approval Number:    Date Approved/Denied:   PASRR Number:    Discharge Plan: SNF   Current Diagnoses: Patient Active Problem List   Diagnosis Date Noted  . Severe sepsis ([place]) 10/21/2020  . Aspiration pneumonia ([place]) 10/21/2020  . Acute lower UTI 10/21/2020  . Acute metabolic encephalopathy 16/96/7893  . Nausea & vomiting 10/21/2020  . Dysphagia 06/12/2019  . CKD (chronic kidney disease) stage 3, GFR 30-59 ml/min (HCC) 01/08/2019  . Constipation 06/22/2017  . Gait abnormality 11/13/2016  . Hyperuricemia 10/31/2016  . Essential tremor 09/04/2016  . 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 Type 2 diabetes mellitus with neurological complications ([place]) 81/07/7508  . Diabetic peripheral neuropathy associated with type 2 diabetes mellitus ([place]) 07/05/2016  . Partial retinal detachment of left eye 04/10/2016  . Benign hypertensive heart disease without heart failure 04/10/2016  . 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 Coronary artery disease of native artery of native heart with stable angina pectoris ([place]) 04/10/2016  . Hyperlipidemia associated with type 2 diabetes mellitus ([place]) 04/05/2016  . BPH without obstruction/lower urinary tract symptoms 04/05/2016  . Bilateral primary osteoarthritis of knee 04/05/2016    Orientation RESPIRATION BLADDER Height & Weight     Self  Normal   Weight: 77.6 kg Height:  5\' 5"  (165.1 cm)   BEHAVIORAL SYMPTOMS/MOOD NEUROLOGICAL BOWEL NUTRITION STATUS      Incontinent Diet (clear)  AMBULATORY STATUS COMMUNICATION OF NEEDS Skin   Total Care Non-Verbally Normal                       Personal Care Assistance Level of Assistance  Bathing,Feeding,Dressing,Total care Bathing Assistance: Maximum assistance Feeding assistance: Maximum assistance Dressing Assistance: Maximum assistance Total Care Assistance: Maximum assistance   Functional Limitations Info  Sight,Hearing,Speech Sight Info: Adequate Hearing Info: Adequate Speech Info: Impaired (Non verbal)    SPECIAL CARE FACTORS FREQUENCY   (total care)                    Contractures Contractures Info: Not present    Additional Factors Info  Code Status,Allergies Code Status Info:  (Full) Allergies Info:  (hydrocodone/morphine related)           Current Medications (10/21/2020):  This is the current hospital active medication list Current Facility-Administered Medications  Medication Dose Route Frequency Provider Last Rate Last Admin  . 0.9 %  sodium chloride infusion   Intravenous Continuous Cherene Altes, MD      . acetaminophen (TYLENOL) tablet 650 mg  650 mg Oral Q6H PRN Howerter, Justin B, DO       Or  . acetaminophen (TYLENOL) suppository 650 mg  650 mg Rectal Q6H PRN Howerter, Justin B, DO      . Ampicillin-Sulbactam (UNASYN) 3 g in sodium chloride 0.9 % 100 mL IVPB  3 g Intravenous Q6H Cherene Altes, MD      .  insulin aspart (novoLOG) injection 0-6 Units  0-6 Units Subcutaneous TID WC Cherene Altes, MD      . lactated ringers infusion   Intravenous Continuous Cherene Altes, MD 100 mL/hr at 10/21/20 1150 Restarted at 10/21/20 1150  . MEDLINE mouth rinse  15 mL Mouth Rinse BID Joette Catching T, MD      . ondansetron [facility]) injection 4 mg  4 mg Intravenous Q6H PRN Howerter, Justin B, DO      . Resource ThickenUp Clear   Oral PRN Cherene Altes, MD         Discharge  Medications: Please see discharge summary for a list of discharge medications.  Relevant Imaging Results:  Relevant Lab Results:   Additional Information SS#: [id number]  Dessa Phi, RN

## 2020-10-21 NOTE — ED Notes (Signed)
Pt with dementia/ams, unable to sign mse waiver for triage.

## 2020-10-21 NOTE — ED Notes (Signed)
ED TO INPATIENT HANDOFF REPORT  ED Nurse Name and Phone #: Erick Colace, RN [phone]  S Name/Age/Gender Jose Castillo 80 y.o. male Room/Bed: WA21/WA21  Code Status   Code Status: DNR  Home/SNF/Other Skilled nursing facility self Is this baseline? Yes  Triage Complete: Triage complete  Chief Complaint Severe sepsis ([place]) [A41.9, R65.20]  Triage Note Pt arrived via EMS from [facility] for fever and shortness of breath.  Nursing staff reported to EMS that pt vomited twice, 02 sats 89% on room air, and possible aspiration, less aggressive than usual.  Pt with hx of dementia.  Fever reported by SNF of 104.0, staff gave pt tylenol and phenergan.  On arrival rectal temp found to be 101.6, sats 98% on room air.  Pt a DNR.     Allergies Allergies  Allergen Reactions  . Lortab [Hydrocodone-Acetaminophen] Other (See Comments)    Reaction:  Affected pts cognition   . Morphine And Related Other (See Comments)    Reaction:  Affected pts cognition     Level of Care/Admitting Diagnosis ED Disposition    ED Disposition Condition Comment   Admit  Hospital Area: [place] [[id number]]  Level of Care: Progressive [102]  Admit to Progressive based on following criteria: MULTISYSTEM THREATS such as stable sepsis, metabolic/electrolyte imbalance with or without encephalopathy that is responding to early treatment.  May admit patient to [facility] or [facility] if equivalent level of care is available:: No  Covid Evaluation: Asymptomatic Screening Protocol (No Symptoms)  Diagnosis: Severe sepsis [facility]) [[id number]]  Admitting Physician: Rhetta Mura [[id number]]  Attending Physician: Rhetta Mura [[id number]]  Estimated length of stay: past midnight tomorrow  Certification:: I certify this patient will need inpatient services for at least 2 midnights       B Medical/Surgery History Past Medical History:  Diagnosis Date  . Dementia ([place])   . Diabetes  ([place])   . Heart disease   . Hyperlipidemia   . Hypotension    Past Surgical History:  Procedure Laterality Date  . APPENDECTOMY  1961  . CORONARY ANGIOPLASTY WITH STENT PLACEMENT  2016  . TONSILLECTOMY AND ADENOIDECTOMY    . TOTAL KNEE ARTHROPLASTY  2012     A IV Location/Drains/Wounds Patient Lines/Drains/Airways Status    Active Line/Drains/Airways    Name Placement date Placement time Site Days   Peripheral IV 10/21/20 Left;Posterior Forearm 10/21/20  --  Forearm  less than 1   Peripheral IV 10/21/20 Right;Posterior Forearm 10/21/20  0330  Forearm  less than 1          Intake/Output Last 24 hours  Intake/Output Summary (Last 24 hours) at 10/21/2020 0414 Last data filed at 10/21/2020 0230 Gross per 24 hour  Intake --  Output 300 ml  Net -300 ml    Labs/Imaging Results for orders placed or performed during the hospital encounter of 10/21/20 (from the past 48 hour(s))  Urinalysis, Routine w reflex microscopic Urine, Catheterized     Status: Abnormal   Collection Time: 10/21/20  2:01 AM  Result Value Ref Range   Color, Urine YELLOW YELLOW   APPearance CLOUDY (A) CLEAR   Specific Gravity, Urine 1.018 1.005 - 1.030   pH 5.0 5.0 - 8.0   Glucose, UA NEGATIVE NEGATIVE mg/dL   Hgb urine dipstick MODERATE (A) NEGATIVE   Bilirubin Urine NEGATIVE NEGATIVE   Ketones, ur 5 (A) NEGATIVE mg/dL   Protein, ur 30 (A) NEGATIVE mg/dL   Nitrite NEGATIVE NEGATIVE  Leukocytes,Ua LARGE (A) NEGATIVE   RBC / HPF 6-10 0 - 5 RBC/hpf   WBC, UA >50 (H) 0 - 5 WBC/hpf   Bacteria, UA MANY (A) NONE SEEN   Mucus PRESENT     Comment: Performed at [facility], [place] [address] [place] , [place] [address]  Comprehensive metabolic panel     Status: Abnormal   Collection Time: 10/21/20  2:07 AM  Result Value Ref Range   Sodium 140 135 - 145 mmol/L   Potassium 4.4 3.5 - 5.1 mmol/L   Chloride 108 98 - 111 mmol/L   CO2 22 22 - 32 mmol/L   Glucose, Bld 245 (H) 70 - 99 mg/dL     Comment: Glucose reference range applies only to samples taken after fasting for at least 8 hours.   BUN 27 (H) 8 - 23 mg/dL   Creatinine, Ser 1.34 (H) 0.61 - 1.24 mg/dL   Calcium 8.1 (L) 8.9 - 10.3 mg/dL   Total Protein 6.5 6.5 - 8.1 g/dL   Albumin 3.7 3.5 - 5.0 g/dL   AST 19 15 - 41 U/L   ALT 12 0 - 44 U/L   Alkaline Phosphatase 120 38 - 126 U/L   Total Bilirubin 1.1 0.3 - 1.2 mg/dL   GFR, Estimated 54 (L) >60 mL/min    Comment: (NOTE) Calculated using the CKD-EPI Creatinine Equation (2021)    Anion gap 10 5 - 15    Comment: Performed at [facility], [place] [address] [place], [place] [address]  Lactic acid, plasma     Status: Abnormal   Collection Time: 10/21/20  2:07 AM  Result Value Ref Range   Lactic Acid, Venous 2.1 (HH) 0.5 - 1.9 mmol/L    Comment: CRITICAL RESULT CALLED TO, READ BACK BY AND VERIFIED WITH: SAWYER C. 04.21.22 @ 0247 BY MECIAL J. Performed at [facility], [place] [address], [place], [place] [address]   CBC with Differential     Status: Abnormal   Collection Time: 10/21/20  2:07 AM  Result Value Ref Range   WBC 10.4 4.0 - 10.5 K/uL   RBC 4.06 (L) 4.22 - 5.81 MIL/uL   Hemoglobin 12.8 (L) 13.0 - 17.0 g/dL   HCT 39.8 39.0 - 52.0 %   MCV 98.0 80.0 - 100.0 fL   MCH 31.5 26.0 - 34.0 pg   MCHC 32.2 30.0 - 36.0 g/dL   RDW 13.7 11.5 - 15.5 %   Platelets 214 150 - 400 K/uL   nRBC 0.0 0.0 - 0.2 %   Neutrophils Relative % 93 %   Neutro Abs 9.5 (H) 1.7 - 7.7 K/uL   Lymphocytes Relative 3 %   Lymphs Abs 0.3 (L) 0.7 - 4.0 K/uL   Monocytes Relative 4 %   Monocytes Absolute 0.4 0.1 - 1.0 K/uL   Eosinophils Relative 0 %   Eosinophils Absolute 0.0 0.0 - 0.5 K/uL   Basophils Relative 0 %   Basophils Absolute 0.0 0.0 - 0.1 K/uL   Immature Granulocytes 0 %   Abs Immature Granulocytes 0.04 0.00 - 0.07 K/uL    Comment: Performed at [facility], [place] [address] [place], [place] [address]  Protime-INR     Status:  None   Collection Time: 10/21/20  2:07 AM  Result Value Ref Range   Prothrombin Time 13.7 11.4 - 15.2 seconds   INR 1.0 0.8 - 1.2    Comment: (NOTE) INR goal varies based on device and  disease states. Performed at [facility], [place] [address] [place], [place] [address]    DG Chest 2 View  Result Date: 10/21/2020 CLINICAL DATA:  Fever, vomiting, dementia, suspected sepsis EXAM: CHEST - 2 VIEW COMPARISON:  Radiograph 07/27/2016 FINDINGS: Widespread multifocal mixed consolidative airspace and interstitial opacity throughout both lungs. No pneumothorax or visible effusion. Prominent cardiomediastinal silhouette though may be accentuated by low volumes and portable technique. No acute osseous or soft tissue abnormality. Telemetry leads overlie the chest. IMPRESSION: Widespread multifocal mixed consolidative airspace and interstitial opacity throughout both lungs, consistent with multifocal pneumonia with a differential which could include atypical viral etiologies such as COVID-19. Electronically Signed   By: Lovena Le M.D.   On: 10/21/2020 02:40    Pending Labs Unresulted Labs (From admission, onward)          Start     Ordered   10/21/20 0309  SARS CORONAVIRUS 2 (TAT 6-24 HRS) Nasopharyngeal Nasopharyngeal Swab  (Tier 3 - Symptomatic/asymptomatic)  Once,   STAT       Question Answer Comment  Is this test for diagnosis or screening Screening   Symptomatic for COVID-19 as defined by CDC No   Hospitalized for COVID-19 No   Admitted to ICU for COVID-19 No   Previously tested for COVID-19 Yes   Resident in a congregate (group) care setting Yes   Employed in healthcare setting No   Has patient completed COVID vaccination(s) (2 doses of Pfizer/Moderna 1 dose of [employer]) Unknown      10/21/20 0309   10/21/20 0304  Urine culture  Add-on,   AD        10/21/20 0303   10/21/20 0201  Lactic acid, plasma  Now then every 2 hours,   STAT      10/21/20 0200    10/21/20 0201  Culture, blood (Routine x 2)  BLOOD CULTURE X 2,   STAT      10/21/20 0200          Vitals/Pain Today's Vitals   10/21/20 0226 10/21/20 0230 10/21/20 0300 10/21/20 0330  BP:  (!) 87/55 111/83 112/64  Pulse:  84 92 94  Resp:  (!) 24 (!) 24 (!) 29  Temp:      TempSrc:      SpO2:  96% 93% 100%  Weight: 80 kg     Height: 5\' 5"  (1.651 m)       Isolation Precautions Airborne and Contact precautions  Medications Medications  acetaminophen (TYLENOL) tablet 1,000 mg (0 mg Oral Hold 10/21/20 0354)  acetaminophen (TYLENOL) tablet 650 mg (has no administration in time range)    Or  acetaminophen (TYLENOL) suppository 650 mg (has no administration in time range)  lactated ringers bolus 1,000 mL (1,000 mLs Intravenous New Bag/Given 10/21/20 0335)  cefTRIAXone (ROCEPHIN) 2 g in sodium chloride 0.9 % 100 mL IVPB (2 g Intravenous New Bag/Given 10/21/20 0317)  lactated ringers bolus 1,000 mL (1,000 mLs Intravenous New Bag/Given 10/21/20 0352)    Mobility non-ambulatory High fall risk   Focused Assessments    R Recommendations: See Admitting Provider Note  Report given to:   Additional Notes:

## 2020-10-21 NOTE — TOC Initial Note (Addendum)
Transition of Care [facility]) - Initial/Assessment Note    Patient Details  Name: Jose Castillo MRN: [id number] Date of Birth: Apr 07, 1941  [facility] CM/SW Contact:    Dessa Phi, RN Phone Number: 10/21/2020, 3:08 PM  Clinical Narrative: spoke to dtr Kelly-patient is non verbal;total care w/ADL's/bed bound-hoyer lift used for transfers, & OOB-from [facility]-d/c plan to return. No PT cons needed.                  Barriers to Discharge: Continued Medical Work up   Patient Goals and CMS Choice CMS Medicare.gov Compare Post Acute Care list provided to:: Patient Represenative (must comment) (dtr Kelly [phone]) Choice offered to / list presented to : Adult Children  Expected Discharge Plan and Services   Discharge Planning Services: CM Consult                                       Prior Living Arrangements/Services Living arrangements for the past 2 months:  (LTC) Lives with:: Facility Resident Patient language and need for interpreter reviewed:: Yes Do you feel safe going back to the place where you live?: Yes      Need for Family Participation in Patient Care: No (Comment) Care giver support system in place?: Yes (comment)   Criminal Activity/Legal Involvement Pertinent to Current Situation/Hospitalization: No - Comment as needed  Activities of Daily Living Home Assistive Devices/Equipment: Blood pressure cuff,Grab bars around toilet,Grab bars in shower,Hand-held shower hose,Hospital bed,Hoyer Lift,Scales,Nebulizer,Wheelchair ADL Screening (condition at time of admission) Patient's cognitive ability adequate to safely complete daily activities?: No Is the patient deaf or have difficulty hearing?: Yes Does the patient have difficulty seeing, even when wearing glasses/contacts?: No Does the patient have difficulty concentrating, remembering, or making decisions?: Yes Patient able to express need for assistance with ADLs?: No Does the patient  have difficulty dressing or bathing?: Yes Independently performs ADLs?: No Communication: Independent Dressing (OT): Dependent Is this a change from baseline?: Pre-admission baseline Grooming: Dependent Is this a change from baseline?: Pre-admission baseline Feeding: Dependent Is this a change from baseline?: Pre-admission baseline Bathing: Dependent Is this a change from baseline?: Pre-admission baseline Toileting: Dependent Is this a change from baseline?: Pre-admission baseline In/Out Bed: Dependent Is this a change from baseline?: Pre-admission baseline Walks in Home: Dependent Is this a change from baseline?: Pre-admission baseline Does the patient have difficulty walking or climbing stairs?: Yes (secondary to weakness and deconditioning) Weakness of Legs: Both Weakness of Arms/Hands: None  Permission Sought/Granted Permission sought to share information with : Case Manager Permission granted to share information with : Yes, Verbal Permission Granted  Share Information with NAME: Case Manager           Emotional Assessment Appearance:: Appears stated age Attitude/Demeanor/Rapport: Gracious Affect (typically observed): Other (comment) (Non verbal) Orientation: : Oriented to Self Alcohol / Substance Use: Not Applicable Psych Involvement: No (comment)  Admission diagnosis:  Severe sepsis ([place]) [A41.9, R65.20] Patient Active Problem List   Diagnosis Date Noted  . Severe sepsis ([place]) 10/21/2020  . Aspiration pneumonia ([place]) 10/21/2020  . Acute lower UTI 10/21/2020  . Acute metabolic encephalopathy 54/65/6812  . Nausea & vomiting 10/21/2020  . Dysphagia 06/12/2019  . CKD (chronic kidney disease) stage 3, GFR 30-59 ml/min (HCC) 01/08/2019  . Constipation 06/22/2017  . Gait abnormality 11/13/2016  . Hyperuricemia 10/31/2016  . Essential tremor 09/04/2016  . Frontal  dementia ([place]) 09/04/2016  . Type 2 diabetes mellitus with neurological complications ([place]) 62/56/3893   . Diabetic peripheral neuropathy associated with type 2 diabetes mellitus ([place]) 07/05/2016  . Partial retinal detachment of left eye 04/10/2016  . Benign hypertensive heart disease without heart failure 04/10/2016  . 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 Coronary artery disease of native artery of native heart with stable angina pectoris ([place]) 04/10/2016  . Hyperlipidemia associated with type 2 diabetes mellitus ([place]) 04/05/2016  . BPH without obstruction/lower urinary tract symptoms 04/05/2016  . Bilateral primary osteoarthritis of knee 04/05/2016   PCP:  [name], DO Pharmacy:   [place], [place] [address] Phone: [phone] Fax: [phone]     Social Determinants of Health (SDOH) Interventions    Readmission Risk Interventions No flowsheet data found.

## 2020-10-21 NOTE — Progress Notes (Signed)
Patient had not voided during shift. Bladder scan completed and resulted in 488 ml.  Dr. Thereasa Solo notified, order obtained for I&O cath. Prior to I&O, pt voided.  I&O cath held at this time. Cap Massi, Laurel Dimmer, RN

## 2020-10-21 NOTE — ED Notes (Signed)
Report given to Felicia, RN

## 2020-10-21 NOTE — Progress Notes (Signed)
PHARMACY NOTE -  [facility] has been assisting with dosing of Zosyn for aspiration/UTI.  Dosage remains stable at 3.375 g IV q8 hr and further renal adjustments per institutional Pharmacy antibiotic protocol  Pharmacy will sign off, following peripherally for culture results or dose adjustments. Please reconsult if a change in clinical status warrants re-evaluation of dosage.  Reuel Boom, PharmD, BCPS [phone] 10/21/2020, 8:56 AM

## 2020-10-21 NOTE — ED Provider Notes (Signed)
[facility] DEPT Provider Note   CSN: [id number] Arrival date & time: 10/21/20  0144     History Chief Complaint  Patient presents with  . Fever    Jose Castillo is a 80 y.o. male.  History from EMS.  Patient is DNR with pretty bad vascular dementia.  Apparently patient had a couple episodes of vomiting tonight witnessed by staff at the facility.  It sounds like they checked the temperature and it was 104.0.  They then gave him some Phenergan and Tylenol but there is concerned that he aspirated.  EMS was called.  On their arrival he was satting 89% on room air so was started on oxygen and brought here for further eval.  Patient not able to offer any history.        Past Medical History:  Diagnosis Date  . Dementia ([place])   . Diabetes ([place])   . Heart disease   . Hyperlipidemia   . Hypotension     Patient Active Problem List   Diagnosis Date Noted  . Severe sepsis ([place]) 10/21/2020  . Aspiration pneumonia ([place]) 10/21/2020  . Acute lower UTI 10/21/2020  . Acute metabolic encephalopathy 25/95/6387  . Nausea & vomiting 10/21/2020  . Dysphagia 06/12/2019  . CKD (chronic kidney disease) stage 3, GFR 30-59 ml/min (HCC) 01/08/2019  . Constipation 06/22/2017  . Gait abnormality 11/13/2016  . Hyperuricemia 10/31/2016  . Essential tremor 09/04/2016  . 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 Type 2 diabetes mellitus with neurological complications ([place]) 56/43/3295  . Diabetic peripheral neuropathy associated with type 2 diabetes mellitus ([place]) 07/05/2016  . Partial retinal detachment of left eye 04/10/2016  . Benign hypertensive heart disease without heart failure 04/10/2016  . 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 Coronary artery disease of native artery of native heart with stable angina pectoris ([place]) 04/10/2016  . Hyperlipidemia associated with type 2 diabetes mellitus ([place]) 04/05/2016  . BPH without obstruction/lower urinary  tract symptoms 04/05/2016  . Bilateral primary osteoarthritis of knee 04/05/2016    Past Surgical History:  Procedure Laterality Date  . APPENDECTOMY  1961  . CORONARY ANGIOPLASTY WITH STENT PLACEMENT  2016  . TONSILLECTOMY AND ADENOIDECTOMY    . TOTAL KNEE ARTHROPLASTY  2012       Family History  Problem Relation Age of Onset  . Cancer Mother 1       Lymphoma  . Heart attack Father 75  . Diabetes Sister   . Brain cancer Sister     Social History   Tobacco Use  . Smoking status: Former Smoker    Packs/day: 0.50    Years: 20.00    Pack years: 10.00    Types: Cigarettes    Quit date: 2003    Years since quitting: 19.3  . Smokeless tobacco: Never Used  Vaping Use  . Vaping Use: Never used  Substance Use Topics  . Alcohol use: No    Comment: Quit 2013  . Drug use: No    Home Medications Prior to Admission medications   Medication Sig Start Date End Date Taking? Authorizing Provider  allopurinol (ZYLOPRIM) 100 MG tablet Take 1 tablet (100 mg total) by mouth daily. 07/26/16  Yes Reed, Tiffany L, DO  bisacodyl (DULCOLAX) 10 MG suppository Place 10 mg rectally daily as needed (constipation).    Yes [provider]  carbamazepine (TEGRETOL) 200 MG tablet Take 200 mg by mouth every morning. 2 tablets (400 mg) at bedtime  Yes [provider]  clopidogrel (PLAVIX) 75 MG tablet Take 1 tablet (75 mg total) by mouth daily. 07/26/16  Yes Reed, Tiffany L, DO  gabapentin (NEURONTIN) 100 MG capsule Take 100 mg by mouth 2 (two) times daily.    Yes [provider]  ipratropium-albuterol (DUONEB) 0.5-2.5 (3) MG/3ML SOLN Take 3 mLs by nebulization 2 (two) times daily. And q 6 prn sob or wheeze   Yes [provider]  polyethylene glycol (MIRALAX / GLYCOLAX) packet Take 17 g by mouth daily.   Yes [provider]  senna-docusate (SENOKOT-S) 8.6-50 MG tablet Take 1 tablet by mouth 2 (two) times daily.   Yes [provider]  acetaminophen  (TYLENOL) 325 MG tablet Take 650 mg by mouth 3 (three) times daily. 08/28/19   [provider]    Allergies    Lortab [hydrocodone-acetaminophen] and Morphine and related  Review of Systems   Review of Systems  Unable to perform ROS: Dementia    Physical Exam Updated Vital Signs BP (!) 81/66 (BP Location: Left Arm)   Pulse 62   Temp 99.8 F (37.7 C) (Oral)   Resp (!) 24   Ht 5\' 5"  (1.651 m)   Wt 80 kg   SpO2 100%   BMI 29.35 kg/m   Physical Exam Vitals and nursing note reviewed.  Constitutional:      Appearance: He is well-developed.  HENT:     Head: Normocephalic and atraumatic.     Mouth/Throat:     Mouth: Mucous membranes are moist.     Pharynx: Oropharynx is clear.  Eyes:     Pupils: Pupils are equal, round, and reactive to light.  Cardiovascular:     Rate and Rhythm: Normal rate.  Pulmonary:     Effort: Pulmonary effort is normal. Tachypnea present. No respiratory distress.     Breath sounds: Rhonchi present.  Abdominal:     General: There is no distension.  Musculoskeletal:        General: Normal range of motion.     Cervical back: Normal range of motion.  Neurological:     Mental Status: He is alert. He is disoriented.     ED Results / Procedures / Treatments   Labs (all labs ordered are listed, but only abnormal results are displayed) Labs Reviewed  COMPREHENSIVE METABOLIC PANEL - Abnormal; Notable for the following components:      Result Value   Glucose, Bld 245 (*)    BUN 27 (*)    Creatinine, Ser 1.34 (*)    Calcium 8.1 (*)    GFR, Estimated 54 (*)    All other components within normal limits  LACTIC ACID, PLASMA - Abnormal; Notable for the following components:   Lactic Acid, Venous 2.1 (*)    All other components within normal limits  LACTIC ACID, PLASMA - Abnormal; Notable for the following components:   Lactic Acid, Venous 2.7 (*)    All other components within normal limits  CBC WITH DIFFERENTIAL/PLATELET - Abnormal; Notable  for the following components:   RBC 4.06 (*)    Hemoglobin 12.8 (*)    Neutro Abs 9.5 (*)    Lymphs Abs 0.3 (*)    All other components within normal limits  URINALYSIS, ROUTINE W REFLEX MICROSCOPIC - Abnormal; Notable for the following components:   APPearance CLOUDY (*)    Hgb urine dipstick MODERATE (*)    Ketones, ur 5 (*)    Protein, ur 30 (*)    Leukocytes,Ua  LARGE (*)    WBC, UA >50 (*)    Bacteria, UA MANY (*)    All other components within normal limits  HEMOGLOBIN A1C - Abnormal; Notable for the following components:   Hgb A1c MFr Bld 7.1 (*)    All other components within normal limits  CULTURE, BLOOD (ROUTINE X 2)  CULTURE, BLOOD (ROUTINE X 2)  URINE CULTURE  SARS CORONAVIRUS 2 (TAT 6-24 HRS)  MRSA PCR SCREENING  PROTIME-INR  PROCALCITONIN  MAGNESIUM  STREP PNEUMONIAE URINARY ANTIGEN  LEGIONELLA PNEUMOPHILA SEROGP 1 UR AG  BASIC METABOLIC PANEL  CBC WITH DIFFERENTIAL/PLATELET  PHOSPHORUS  BLOOD GAS, VENOUS  CALCIUM, IONIZED  LACTIC ACID, PLASMA    EKG None  Radiology DG Chest 2 View  Result Date: 10/21/2020 CLINICAL DATA:  Fever, vomiting, dementia, suspected sepsis EXAM: CHEST - 2 VIEW COMPARISON:  Radiograph 07/27/2016 FINDINGS: Widespread multifocal mixed consolidative airspace and interstitial opacity throughout both lungs. No pneumothorax or visible effusion. Prominent cardiomediastinal silhouette though may be accentuated by low volumes and portable technique. No acute osseous or soft tissue abnormality. Telemetry leads overlie the chest. IMPRESSION: Widespread multifocal mixed consolidative airspace and interstitial opacity throughout both lungs, consistent with multifocal pneumonia with a differential which could include atypical viral etiologies such as COVID-19. Electronically Signed   By: Lovena Le M.D.   On: 10/21/2020 02:40    Procedures .Critical Care Performed by: Merrily Pew, MD Authorized by: Merrily Pew, MD   Critical care  provider statement:    Critical care time (minutes):  45   Critical care was necessary to treat or prevent imminent or life-threatening deterioration of the following conditions:  Sepsis   Critical care was time spent personally by me on the following activities:  Discussions with consultants, evaluation of patient's response to treatment, examination of patient, ordering and performing treatments and interventions, ordering and review of laboratory studies, ordering and review of radiographic studies, pulse oximetry, re-evaluation of patient's condition, obtaining history from patient or surrogate and review of old charts     Medications Ordered in ED Medications  acetaminophen (TYLENOL) tablet 1,000 mg (0 mg Oral Hold 10/21/20 0354)  acetaminophen (TYLENOL) tablet 650 mg (has no administration in time range)    Or  acetaminophen (TYLENOL) suppository 650 mg (has no administration in time range)  lactated ringers infusion ( Intravenous New Bag/Given 10/21/20 0500)  azithromycin (ZITHROMAX) 500 mg in sodium chloride 0.9 % 250 mL IVPB (500 mg Intravenous New Bag/Given 10/21/20 0443)  ondansetron (ZOFRAN) injection 4 mg (has no administration in time range)  piperacillin-tazobactam (ZOSYN) IVPB 3.375 g (has no administration in time range)  insulin aspart (novoLOG) injection 0-6 Units (has no administration in time range)  lactated ringers bolus 1,000 mL (0 mLs Intravenous Stopped 10/21/20 0502)  cefTRIAXone (ROCEPHIN) 2 g in sodium chloride 0.9 % 100 mL IVPB (0 g Intravenous Stopped 10/21/20 0347)  lactated ringers bolus 1,000 mL (0 mLs Intravenous Stopped 10/21/20 0502)  lactated ringers bolus 1,000 mL (1,000 mLs Intravenous New Bag/Given 10/21/20 3846)    ED Course  I have reviewed the triage vital signs and the nursing notes.  Pertinent labs & imaging results that were available during my care of the patient were reviewed by me and considered in my medical decision making (see chart for  details).    MDM Rules/Calculators/A&P                         Possibly septic? Aspiration? Urine  infected. Antibiotics started. BP's slightly soft - responds to fluids, LA slightly elevated. cxr with diffuse findings.   Final Clinical Impression(s) / ED Diagnoses Final diagnoses:  Sepsis, due to unspecified organism, unspecified whether acute organ dysfunction present [facility])    Rx / DC Orders ED Discharge Orders    None       Lalitha Ilyas, Corene Cornea, MD 10/21/20 [phone]

## 2020-10-21 NOTE — Progress Notes (Signed)
Pharmacy Antibiotic Note  Jose Castillo is a 80 y.o. male admitted on 10/21/2020 with fever and shortness of breath..  Pharmacy has been consulted to dose zosyn for asp pna and uti  Plan: Zosyn 3.375g IV Q8H infused over 4hrs. Follow renal function and clinical course  Height: 5\' 5"  (165.1 cm) Weight: 80 kg (176 lb 5.9 oz) IBW/kg (Calculated) : 61.5  Temp (24hrs), Avg:101.6 F (38.7 C), Min:101.6 F (38.7 C), Max:101.6 F (38.7 C)  Recent Labs  Lab 10/21/20 0207  WBC 10.4  CREATININE 1.34*  LATICACIDVEN 2.1*    Estimated Creatinine Clearance: 42.8 mL/min (A) (by C-G formula based on SCr of 1.34 mg/dL (H)).    Allergies  Allergen Reactions  . Lortab [Hydrocodone-Acetaminophen] Other (See Comments)    Reaction:  Affected pts cognition   . Morphine And Related Other (See Comments)    Reaction:  Affected pts cognition      Thank you for allowing pharmacy to be a part of this patient's care.  Dolly Rias RPh 10/21/2020, 4:25 AM

## 2020-10-21 NOTE — Progress Notes (Signed)
Jose Castillo  MCN:[id number] DOB: 1941/03/06 DOA: 10/21/2020 PCP: [name], DO    Brief Narrative:  80 year old with a history of advanced dementia, HTN, DM 2 with peripheral neuropathy, CKD stage IIIa, and CAD status post PCI with stent placement who was transported to the ED after he was observed to suffer an episode of significant vomiting with aspiration at his SNF.  EMS found the patient with a temperature of 104 oxygen saturation of 89% on room air.  CXR in the ED noted multifocal airspace opacities bilaterally.  Consultants:  None  Code Status: NO CODE BLUE  Antimicrobials:  Azithromycin 4/20 Ceftriaxone 4/20 Zosyn 4/20  DVT prophylaxis: SCDs  Subjective: Patient was interviewed and examined earlier today by one of my partners.  Assessment & Plan:  Aspiration pneumonitis - sepsis  Acute hypoxic respiratory failure  Abnormal UA - probable UTI  Nausea with vomiting  Toxic metabolic encephalopathy on baseline advanced dementia  DM2 A1c 7.1  CKD stage IIIa Baseline creatinine 1.2-1.4   Family Communication: spoke w/ his daughter at the bedside  Status is: Inpatient  Remains inpatient appropriate because:Inpatient level of care appropriate due to severity of illness   Dispo: The patient is from: SNF              Anticipated d/c is to: SNF              Patient currently is not medically stable to d/c.   Difficult to place patient No   Objective: Blood pressure 112/63, pulse 62, temperature 98.9 F (37.2 C), temperature source Oral, resp. rate (!) 26, height 5\' 5"  (1.651 m), weight 77.6 kg, SpO2 99 %.  Intake/Output Summary (Last 24 hours) at 10/21/2020 0754 Last data filed at 10/21/2020 0502 Gross per 24 hour  Intake 2100 ml  Output 300 ml  Net 1800 ml   Filed Weights   10/21/20 0226 10/21/20 0542  Weight: 80 kg 77.6 kg    Examination: Patient was examined by her partner earlier today.  CBC: Recent Labs  Lab 10/21/20 0207  WBC  10.4  NEUTROABS 9.5*  HGB 12.8*  HCT 39.8  MCV 98.0  PLT 629   Basic Metabolic Panel: Recent Labs  Lab 10/21/20 0207  NA 140  K 4.4  CL 108  CO2 22  GLUCOSE 245*  BUN 27*  CREATININE 1.34*  CALCIUM 8.1*  MG 1.9   GFR: Estimated Creatinine Clearance: 42.2 mL/min (A) (by C-G formula based on SCr of 1.34 mg/dL (H)).  Liver Function Tests: Recent Labs  Lab 10/21/20 0207  AST 19  ALT 12  ALKPHOS 120  BILITOT 1.1  PROT 6.5  ALBUMIN 3.7    Coagulation Profile: Recent Labs  Lab 10/21/20 0207  INR 1.0    HbA1C: Hemoglobin A1C  Date/Time Value Ref Range Status  01/09/2019 12:00 AM 6.7  Final  06/23/2017 12:00 AM 6.8  Final   Hgb A1c MFr Bld  Date/Time Value Ref Range Status  10/21/2020 02:07 AM 7.1 (H) 4.8 - 5.6 % Final    Comment:    (NOTE) Pre diabetes:          5.7%-6.4%  Diabetes:              >6.4%  Glycemic control for   <7.0% adults with diabetes     CBG: Recent Labs  Lab 10/21/20 0645  GLUCAP 215*    Recent Results (from the past 240 hour(s))  MRSA PCR Screening  Status: None   Collection Time: 10/21/20  6:07 AM   Specimen: Nasopharyngeal  Result Value Ref Range Status   MRSA by PCR NEGATIVE NEGATIVE Final    Comment:        The GeneXpert MRSA Assay (FDA approved for NASAL specimens only), is one component of a comprehensive MRSA colonization surveillance program. It is not intended to diagnose MRSA infection nor to guide or monitor treatment for MRSA infections. Performed at [facility], [place] [address] [place], [place] [address]      Scheduled Meds: . acetaminophen  1,000 mg Oral Once  . insulin aspart  0-6 Units Subcutaneous Q6H   Continuous Infusions: . azithromycin 500 mg (10/21/20 0443)  . lactated ringers 75 mL/hr at 10/21/20 0500  . piperacillin-tazobactam (ZOSYN)  IV 3.375 g (10/21/20 4834)     LOS: 0 days   Cherene Altes, MD [facility] Office  [phone] Pager - Text  Page per Amion  If 7PM-7AM, please contact night-coverage per Amion 10/21/2020, 7:54 AM

## 2020-10-21 NOTE — ED Triage Notes (Signed)
Pt arrived via EMS from [facility] SN[facility] for fever and shortness of breath.  Nursing staff reported to EMS that pt vomited twice, 02 sats 89% on room air, and possible aspiration, less aggressive than usual.  Pt with hx of dementia.  Fever reported by SNF of 104.0, staff gave pt tylenol and phenergan.  On arrival rectal temp found to be 101.6, sats 98% on room air.  Pt a DNR.

## 2020-10-21 NOTE — H&P (Signed)
History and Physical    PLEASE NOTE THAT DRAGON DICTATION SOFTWARE WAS USED IN THE CONSTRUCTION OF THIS NOTE.   [name] DJS:[id number] DOB: 1940/10/20 DOA: 10/21/2020  PCP: [name], DO Patient coming from: home SNF  I have personally briefly reviewed patient's old medical records in [place]  Chief Complaint: aspiration   HPI: Jose Castillo is a 80 y.o. 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   In the setting of the patient's underlying dementia, the following history is provided by the SNF staff, EMS, my discussions with the emergency department physician, and via chart review.  SNF staff report that the patient has been experiencing intermittent nausea vomiting over the course of the last day over which time he received a dose of Phenergan, and that they witnessed the patient aspirating on his vomit earlier this evening, at which time they contacted EMS.  EMS reportedly found the patient to be febrile, with an initial temperature of 104 as well as mildly hypoxic, with O2 sat of 89% on room air in the context of no known baseline supplemental oxygen requirements.  He was subsequent brought by EMS to was in the emergency department for further evaluation of this witnessed aspiration event.  No recent trauma reported, and no known recent COVID-19 exposures reported.    ED Course:  Vital signs in the ED were notable for the following: Tetramex 1-1.6, heart rate 84-94; blood pressure initially  87/55, which is improved to 108/74 following interval IV fluids, as further described below; respiratory rate 19-29; oxygen saturation 95 to 96% on room air in the ED.  Labs were notable for the following: CMP was notable for the following: Sodium 140, bicarbonate 22, creatinine 1.34 relative to most recent prior value of 1.2 in June 2021, glucose 245.  CBC notable for white blood cell count of 10,400 with 93% neutrophils.  Lactic acid 2.1.  Urinalysis showed greater than 50 white blood cells, many bacteria, large leukocyte esterase.  Nasopharyngeal COVID-19 PCR was performed in the ED this evening, with result currently pending.  Blood cultures x2 were collected and urine culture was added to urine sample prior to initiation of any antibiotics.  Chest x-ray showed multifocal airspace/interstitial opacities bilaterally consistent with multifocal pneumonia in the absence of overt edema, pleural effusion, or pneumothorax.  While in the ED, the following were administered: Rocephin 2 g IV x1, 2 L lactated Ringer bolus.  Subsequent, the patient was admitted to the PCU for further evaluation and management of presenting severe sepsis due to suspected aspiration pneumonia as well as urinary tract infection.     Review of Systems: As per HPI otherwise 10 point review of systems negative.   Past Medical History:  Diagnosis Date  . Dementia ([place])   . Diabetes ([place])   . Heart disease   . Hyperlipidemia   . Hypotension     Past Surgical History:  Procedure Laterality Date  . APPENDECTOMY  1961  . CORONARY ANGIOPLASTY WITH STENT PLACEMENT  2016  . TONSILLECTOMY AND ADENOIDECTOMY    . TOTAL  KNEE ARTHROPLASTY  2012    Social History:  reports that he quit smoking about 19 years ago. His smoking use included cigarettes. He has a 10.00 pack-year smoking history. He has never used smokeless tobacco. He reports that he does not drink alcohol and does not use drugs.   Allergies  Allergen Reactions   . Lortab [Hydrocodone-Acetaminophen] Other (See Comments)    Reaction:  Affected pts cognition   . Morphine And Related Other (See Comments)    Reaction:  Affected pts cognition     Family History  Problem Relation Age of Onset  . Cancer Mother 87       Lymphoma  . Heart attack Father 66  . Diabetes Sister   . Brain cancer Sister      Prior to Admission medications   Medication Sig Start Date End Date Taking? Authorizing Provider  allopurinol (ZYLOPRIM) 100 MG tablet Take 1 tablet (100 mg total) by mouth daily. 07/26/16  Yes Reed, Tiffany L, DO  bisacodyl (DULCOLAX) 10 MG suppository Place 10 mg rectally daily as needed (constipation).    Yes [provider]  carbamazepine (TEGRETOL) 200 MG tablet Take 200 mg by mouth every morning. 2 tablets (400 mg) at bedtime   Yes [provider]  clopidogrel (PLAVIX) 75 MG tablet Take 1 tablet (75 mg total) by mouth daily. 07/26/16  Yes Reed, Tiffany L, DO  gabapentin (NEURONTIN) 100 MG capsule Take 100 mg by mouth 2 (two) times daily.    Yes [provider]  ipratropium-albuterol (DUONEB) 0.5-2.5 (3) MG/3ML SOLN Take 3 mLs by nebulization 2 (two) times daily. And q 6 prn sob or wheeze   Yes [provider]  polyethylene glycol (MIRALAX / GLYCOLAX) packet Take 17 g by mouth daily.   Yes [provider]  senna-docusate (SENOKOT-S) 8.6-50 MG tablet Take 1 tablet by mouth 2 (two) times daily.   Yes [provider]  acetaminophen (TYLENOL) 325 MG tablet Take 650 mg by mouth 3 (three) times daily. 08/28/19   [provider]     Objective    Physical Exam: Vitals:   10/21/20 0226 10/21/20 0230 10/21/20 0300 10/21/20 0330  BP:  (!) 87/55 111/83 112/64  Pulse:  84 92 94  Resp:  (!) 24 (!) 24 (!) 29  Temp:      TempSrc:      SpO2:  96% 93% 100%  Weight: 80 kg     Height: _0  (1.651 m)       General: appears to be stated age; somnolent, confused; unable to follow instructions at  this time.  Skin: warm, dry, no rash Head:  AT/[place] Mouth:  Oral mucosa membranes appear moist, normal dentition Neck: supple; trachea midline Heart:  RRR; did not appreciate any M/R/G Lungs: bibasilar rales noted; , did not appreciate any wheezes or rhonchi Abdomen: + BS; soft, ND, NT Vascular: 2+ pedal pulses b/l; 2+ radial pulses b/l Extremities: no peripheral edema, no muscle wasting Neuro: In the setting of the patient's current mental status and associated inability to follow instructions, unable to perform full neurologic exam at this time.  As such, assessment of strength, sensation, and cranial nerves is limited at this time. Patient noted to spontaneously move all 4 extremities.      Labs on Admission: I have personally reviewed following labs and imaging studies  CBC: Recent Labs  Lab 10/21/20 0207  WBC 10.4  NEUTROABS 9.5*  HGB 12.8*  HCT 39.8  MCV 98.0  PLT 626   Basic Metabolic Panel: Recent Labs  Lab 10/21/20 0207  NA 140  K 4.4  CL 108  CO2 22  GLUCOSE 245*  BUN 27*  CREATININE 1.34*  CALCIUM 8.1*   GFR: Estimated Creatinine Clearance: 42.8 mL/min (A) (by C-G formula based on SCr of 1.34 mg/dL (H)). Liver Function Tests: Recent Labs  Lab 10/21/20 0207  AST 19  ALT 12  ALKPHOS 120  BILITOT 1.1  PROT 6.5  ALBUMIN 3.7   No results for input(s): LIPASE, AMYLASE in the last 168 hours. No results for input(s): AMMONIA in the last 168 hours. Coagulation Profile: Recent Labs  Lab 10/21/20 0207  INR 1.0   Cardiac Enzymes: No results for input(s): CKTOTAL, CKMB, CKMBINDEX, TROPONINI in the last 168 hours. BNP (last 3 results) No results for input(s): PROBNP in the last 8760 hours. HbA1C: No results for input(s): HGBA1C in the last 72 hours. CBG: No results for input(s): GLUCAP in the last 168 hours. Lipid Profile: No results for input(s): CHOL, HDL, LDLCALC, TRIG, CHOLHDL, LDLDIRECT in the last 72 hours. Thyroid Function Tests: No results  for input(s): TSH, T4TOTAL, FREET4, T3FREE, THYROIDAB in the last 72 hours. Anemia Panel: No results for input(s): VITAMINB12, FOLATE, FERRITIN, TIBC, IRON, RETICCTPCT in the last 72 hours. Urine analysis:    Component Value Date/Time   COLORURINE YELLOW 10/21/2020 0201   APPEARANCEUR CLOUDY (A) 10/21/2020 0201   LABSPEC 1.018 10/21/2020 0201   PHURINE 5.0 10/21/2020 0201   GLUCOSEU NEGATIVE 10/21/2020 0201   HGBUR MODERATE (A) 10/21/2020 0201   BILIRUBINUR NEGATIVE 10/21/2020 0201   KETONESUR 5 (A) 10/21/2020 0201   PROTEINUR 30 (A) 10/21/2020 0201   NITRITE NEGATIVE 10/21/2020 0201   LEUKOCYTESUR LARGE (A) 10/21/2020 0201    Radiological Exams on Admission: DG Chest 2 View  Result Date: 10/21/2020 CLINICAL DATA:  Fever, vomiting, dementia, suspected sepsis EXAM: CHEST - 2 VIEW COMPARISON:  Radiograph 07/27/2016 FINDINGS: Widespread multifocal mixed consolidative airspace and interstitial opacity throughout both lungs. No pneumothorax or visible effusion. Prominent cardiomediastinal silhouette though may be accentuated by low volumes and portable technique. No acute osseous or soft tissue abnormality. Telemetry leads overlie the chest. IMPRESSION: Widespread multifocal mixed consolidative airspace and interstitial opacity throughout both lungs, consistent with multifocal pneumonia with a differential which could include atypical viral etiologies such as COVID-19. Electronically Signed   By: Lovena Le M.D.   On: 10/21/2020 02:40     Assessment/Plan   Constance Hackenberg Kendra is a 80 y.o. 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    Principal Problem:   Severe sepsis ([place]) Active Problems:   Type 2 diabetes mellitus with neurological complications (HCC)   CKD (chronic kidney disease) stage 3, GFR 30-59 ml/min (HCC)   Aspiration pneumonia (HCC)   Acute lower UTI   Acute metabolic encephalopathy   Nausea & vomiting    #) Severe sepsis: due to suspected aspiration pneumonia; SIRS criteria met via presenting objective fever as well as tachypnea.  Potentially multifactorial and infectious source, with suspected aspiration pneumonia given witnessed aspiration event  by SNF on 10/20/20, with presenting chest x-ray showing evidence of multifocal airspace/interstitial opacities consistent with multifocal pneumonia, in addition to suspected urinary tract infection given significant pyuria, many bacteria, large leukocyte esterase identified on presenting urinalysis.  Patient sepsis meets criteria to be considered severe nature on the basis of concomitant evidence of endorgan damage in the form of elevated presenting lactic acid of 2.1.  Blood cultures x2 and urine culture collected prior to initiation of antibiotics in the form of Rocephin.  Will change antibiotics to Zosyn for provision of anaerobic coverage given suspected aspiration pneumonia, which should also provide adequate coverage for urinary tract infection.  Additionally, given the multifocal nature of patient's pneumonia, will add azithromycin for atypical coverage.  Given suspected aspiration event, will keep n.p.o. for now.    Plan: Zosyn and azithromycin, as further detailed above.  Monitor for results blood cultures x2 as well as urine culture.  Lactated Ringer's at 75 cc/h.  Follow for repeat lactic acid level.  Repeat CBC with differential in the morning.  Monitor on telemetry.  Monitor continuous pulse oximetry.  Check Legionella and strep urine antigen.  Add  on procalcitonin level.      #) Urinary tract infection: In the setting of presenting severe sepsis, as further described above, urinalysis is suggestive of underlying urinary tract infection, as further detailed above.  In the context of concomitant suspected underlying aspiration pneumonia, changes to initial antibiotic regimen have been made, as further described above, including transition to Zosyn for anaerobic coverage for potential aspiration, which should also provide adequate urinary tract infection coverage.  By definition, on the basis of the patient's gender, his urinary tract infection is complicated in nature, and warrants consideration for 10 to 14 days of antibiotic coverage.  Plan: IV fluids, as above, with repeat lactic acid level.  Zosyn, as above.  Monitor for results of blood cultures x2 and urine culture.  Repeat CBC with differential at 8 AM this morning.      #) Nausea and vomiting: 1 day of intermittent nausea resulting in 2 3 episodes of nonbloody, nonbilious emesis, with potential complication of aspiration pneumonia, as further detailed above.  Plan: As needed Zofran.  Check EKG, including for evaluation of QTc interval.  N.p.o. for now in the setting of suspected aspiration.     #) Acute metabolic encephalopathy: Confusion and somnolence relative to baseline dementia.  Appears to be metabolic in nature the basis of physiologic stress stemming from severe sepsis due to multiple underlying infectious sources, as further detailed above.  No overt evidence of additional metabolic source.  While the patient is unable to participate in a full neurologic evaluation at this time, he is witnessed to be spontaneously moving all 4 extremities, and acute CVA is felt to be less likely at this time.  Plan: Work-up and management presents for sepsis due to aspiration pneumonia and UTI, as above.  N.p.o. until patient's mental status improved sufficiently that he is able to follow  instructions and participate in/passed nursing bedside swallow evaluation.  Check VBG to evaluate for any component of hypercapnic encephalopathy.  Repeat CMP and CBC at 8 AM this morning.      #) Type 2 diabetes mellitus: Complicated by peripheral polyneuropathy, on gabapentin as an outpatient.  Most recent hemoglobin A1c noted to be 6.7% when checked in July 2020.  Not currently on any insulin or oral hypoglycemic medications as an outpatient.  Presenting glucose noted to be 245.  Plan: Hold home gabapentin  for now in the setting of current n.p.o. status.  Check hemoglobin A1c.  Accu-Cheks every 6 hours with low-dose sliding scale insulin.      #) Stage IIIa chronic kidney disease: Presenting serum creatinine level found to be within baseline range of 1.2-1.4.  Suspect the patient's chronic kidney disease is on the basis of diabetic nephropathy.  Plan: Monitor strict I's and O's and daily weights.  Attempt avoid nephrotoxic agents.  Repeat CMP at 8 AM this morning.     DVT prophylaxis: scd's  Code Status: DNR/DNI Family Communication: none Disposition Plan: Per Rounding Team Consults called: none  Admission status: Inpatient; PCU     Of note, this patient was added by me to the following Admit List/Treatment Team: [email or web address].      PLEASE NOTE THAT DRAGON DICTATION SOFTWARE WAS USED IN THE CONSTRUCTION OF THIS NOTE.   Rhetta Mura DO [facility] Pager [phone] From [place]   10/21/2020, 4:02 AM

## 2020-10-21 NOTE — Plan of Care (Signed)
  Problem: Education: Goal: Knowledge of General Education information will improve Description: Including pain rating scale, medication(s)/side effects and non-pharmacologic comfort measures Outcome: Not Progressing   

## 2020-10-22 LAB — COMPREHENSIVE METABOLIC PANEL
ALT: 11 U/L (ref 0–44)
AST: 19 U/L (ref 15–41)
Albumin: 3.1 g/dL — ABNORMAL LOW (ref 3.5–5.0)
Alkaline Phosphatase: 85 U/L (ref 38–126)
Anion gap: 10 (ref 5–15)
BUN: 25 mg/dL — ABNORMAL HIGH (ref 8–23)
CO2: 22 mmol/L (ref 22–32)
Calcium: 8.2 mg/dL — ABNORMAL LOW (ref 8.9–10.3)
Chloride: 112 mmol/L — ABNORMAL HIGH (ref 98–111)
Creatinine, Ser: 1.2 mg/dL (ref 0.61–1.24)
GFR, Estimated: >60 mL/min (ref >60)
Glucose, Bld: 133 mg/dL — ABNORMAL HIGH (ref 70–99)
Potassium: 4.1 mmol/L (ref 3.5–5.1)
Sodium: 144 mmol/L (ref 135–145)
Total Bilirubin: 0.6 mg/dL (ref 0.3–1.2)
Total Protein: 5.6 g/dL — ABNORMAL LOW (ref 6.5–8.1)

## 2020-10-22 LAB — GLUCOSE, CAPILLARY
Glucose-Capillary: 103 mg/dL — ABNORMAL HIGH (ref 70–99)
Glucose-Capillary: 114 mg/dL — ABNORMAL HIGH (ref 70–99)
Glucose-Capillary: 129 mg/dL — ABNORMAL HIGH (ref 70–99)
Glucose-Capillary: 134 mg/dL — ABNORMAL HIGH (ref 70–99)

## 2020-10-22 LAB — CBC
HCT: 32.1 % — ABNORMAL LOW (ref 39.0–52.0)
Hemoglobin: 10.4 g/dL — ABNORMAL LOW (ref 13.0–17.0)
MCH: 31.7 pg (ref 26.0–34.0)
MCHC: 32.4 g/dL (ref 30.0–36.0)
MCV: 97.9 fL (ref 80.0–100.0)
Platelets: 183 10*3/uL (ref 150–400)
RBC: 3.28 MIL/uL — ABNORMAL LOW (ref 4.22–5.81)
RDW: 13.6 % (ref 11.5–15.5)
WBC: 9.7 10*3/uL (ref 4.0–10.5)
nRBC: 0 % (ref 0.0–0.2)

## 2020-10-22 LAB — MAGNESIUM: Magnesium: 1.8 mg/dL (ref 1.7–2.4)

## 2020-10-22 MED ORDER — MORPHINE SULFATE (PF) 2 MG/ML IV SOLN
2.0000 mg | INTRAVENOUS | Status: DC | PRN
  Administered 2020-10-22 – 2020-10-23 (×4): 2 mg via INTRAVENOUS
  Filled 2020-10-22 (×4): qty 1

## 2020-10-22 NOTE — Progress Notes (Addendum)
Jose Castillo  FUX:[id number] DOB: 05-20-1941 DOA: 10/21/2020 PCP: [name], DO    Brief Narrative:  80 year old with a history of advanced dementia, HTN, DM 2 with peripheral neuropathy, CKD stage IIIa, and CAD status post PCI with stent placement who was transported to the ED after he was observed to suffer an episode of significant vomiting with aspiration at his SNF.  EMS found the patient with a temperature of 104 oxygen saturation of 89% on room air.  CXR in the ED noted multifocal airspace opacities bilaterally.  Consultants:  None  Code Status: NO CODE BLUE  Antimicrobials:  Azithromycin 4/20 Ceftriaxone 4/20 Zosyn 4/20  DVT prophylaxis: SCDs  Subjective: Appeared to be suffering with urinary retention yesterday, but then spontaneously urinated just prior to I&O cath.  Afebrile.  Vital signs stable.  Requiring 4 L nasal cannula to keep saturations at 91%. More alert today. Continues to moan nearly constantly. Reaching for equipment, gripping hand mittens. Does not appear to be in particular distress, but is very vocal. Family has stated this is essentially his baseline.   Assessment & Plan:  Aspiration pneumonitis - sepsis No persisting signs or symptoms to suggest an acute infectious process - plan to discontinue antibiotics after 3 days of treatment for this indication  Acute hypoxic respiratory failure Felt to be due to aspiration pneumonitis/chemical injury - wean oxygen as possible  Gram-negative rod UTI Patient has a reported history of recurrent UTIs per family - continue broad coverage for now and narrow as urine culture allows  Nausea with vomiting Appears to have been self-limited  Toxic metabolic encephalopathy on baseline advanced dementia Family reports that at baseline the patient is nonverbal and fidgets and moans in bed all day -it appears his mental status has returned to his usual baseline  DM2 A1c 7.1 -CBG well controlled at this  time  CKD stage IIIa Baseline creatinine 1.2-1.4 -creatinine improved to baseline range   Family Communication:   Status is: Inpatient  Remains inpatient appropriate because:Inpatient level of care appropriate due to severity of illness   Dispo: The patient is from: SNF              Anticipated d/c is to: SNF              Patient currently is not medically stable to d/c.   Difficult to place patient No   Objective: Blood pressure 117/80, pulse (!) 53, temperature 98.5 F (36.9 C), temperature source Oral, resp. rate (!) 22, height 5\' 5"  (1.651 m), weight 77.6 kg, SpO2 94 %.  Intake/Output Summary (Last 24 hours) at 10/22/2020 0932 Last data filed at 10/22/2020 0418 Gross per 24 hour  Intake 3467.28 ml  Output --  Net 3467.28 ml   Filed Weights   10/21/20 0226 10/21/20 0542  Weight: 80 kg 77.6 kg    Examination: General: No acute respiratory distress Lungs: Fine crackles scattered throughout bilateral fields with no wheezing and good air movement otherwise Cardiovascular: RRR without murmur or rub Abdomen: Nontender, nondistended, soft, bowel sounds positive, no rebound, no ascites, no appreciable mass Extremities: No significant cyanosis, clubbing, or edema bilateral lower extremities   CBC: Recent Labs  Lab 10/21/20 0207 10/22/20 0446  WBC 10.4 9.7  NEUTROABS 9.5*  --   HGB 12.8* 10.4*  HCT 39.8 32.1*  MCV 98.0 97.9  PLT 214 025   Basic Metabolic Panel: Recent Labs  Lab 10/21/20 0207 10/22/20 0446  NA 140 144  K 4.4 4.1  CL 108 112*  CO2 22 22  GLUCOSE 245* 133*  BUN 27* 25*  CREATININE 1.34* 1.20  CALCIUM 8.1* 8.2*  MG 1.9 1.8   GFR: Estimated Creatinine Clearance: 47.2 mL/min (by C-G formula based on SCr of 1.2 mg/dL).  Liver Function Tests: Recent Labs  Lab 10/21/20 0207 10/22/20 0446  AST 19 19  ALT 12 11  ALKPHOS 120 85  BILITOT 1.1 0.6  PROT 6.5 5.6*  ALBUMIN 3.7 3.1*    Coagulation Profile: Recent Labs  Lab 10/21/20 0207   INR 1.0    HbA1C: Hemoglobin A1C  Date/Time Value Ref Range Status  01/09/2019 12:00 AM 6.7  Final  06/23/2017 12:00 AM 6.8  Final   Hgb A1c MFr Bld  Date/Time Value Ref Range Status  10/21/2020 02:07 AM 7.1 (H) 4.8 - 5.6 % Final    Comment:    (NOTE) Pre diabetes:          5.7%-6.4%  Diabetes:              >6.4%  Glycemic control for   <7.0% adults with diabetes     CBG: Recent Labs  Lab 10/21/20 0645 10/21/20 1117 10/21/20 1619 10/22/20 0015 10/22/20 0611  GLUCAP 215* 124* 120* 129* 134*    Recent Results (from the past 240 hour(s))  Culture, blood (Routine x 2)     Status: None (Preliminary result)   Collection Time: 10/21/20  2:01 AM   Specimen: BLOOD  Result Value Ref Range Status   Specimen Description   Final    BLOOD BLOOD LEFT FOREARM Performed at [facility], [place] [address] [place], [place] [address]    Special Requests   Final    BOTTLES DRAWN AEROBIC AND ANAEROBIC Blood Culture adequate volume Performed at [place] [address] [place], [place] [address]    Culture   Final    NO GROWTH 1 DAY Performed at [facility], [place] [address] [place], [place] [address]    Report Status PENDING  Incomplete  Urine culture     Status: Abnormal (Preliminary result)   Collection Time: 10/21/20  2:01 AM   Specimen: Urine, Clean Catch  Result Value Ref Range Status   Specimen Description   Final    URINE, CLEAN CATCH Performed at [facility], [place] [address], [place], [place] [address]    Special Requests   Final    NONE Performed at [facility], [place] [address] [place], [place] [address]    Culture (A)  Final    >=100,000 COLONIES/mL GRAM NEGATIVE RODS SUSCEPTIBILITIES TO FOLLOW Performed at [facility], [place] [address], [place], [place] [address]    Report Status PENDING  Incomplete  Culture, blood (Routine x 2)     Status: None (Preliminary result)    Collection Time: 10/21/20  3:37 AM   Specimen: BLOOD  Result Value Ref Range Status   Specimen Description   Final    BLOOD BLOOD RIGHT FOREARM Performed at [place] [address] [place], [place] [address]    Special Requests   Final    BOTTLES DRAWN AEROBIC AND ANAEROBIC Blood Culture adequate volume Performed at [place] [address], [place], [place] [address]    Culture   Final    NO GROWTH 1 DAY Performed at [facility], [place] [address] [place], [place] [address]    Report Status PENDING  Incomplete  SARS CORONAVIRUS 2 (TAT 6-24 HRS) Nasopharyngeal Nasopharyngeal Swab     Status: None   Collection Time: 10/21/20  3:37 AM   Specimen: Nasopharyngeal Swab  Result Value Ref Range Status   SARS Coronavirus 2 NEGATIVE NEGATIVE Final    Comment: (NOTE) SARS-CoV-2 target nucleic acids are NOT DETECTED.  The SARS-CoV-2 RNA is generally detectable in upper and lower respiratory specimens during the acute phase of infection. Negative results do not preclude SARS-CoV-2 infection, do not rule out co-infections with other pathogens, and should not be used as the sole basis for treatment or other patient management decisions. Negative results must be combined with clinical observations, patient history, and epidemiological information. The expected result is Negative.  Fact Sheet for Patients: SugarRoll.be  Fact Sheet for Healthcare Providers: https://www.woods-mathews.com/  This test is not yet approved or cleared by the [place] FDA and  has been authorized for detection and/or diagnosis of SARS-CoV-2 by FDA under an Emergency Use Authorization (EUA). This EUA will remain  in effect (meaning this test can be used) for the duration of the COVID-19 declaration under Se ction 564(b)(1) of the Act, 21 U.S.C. section 360bbb-3(b)(1), unless the authorization is terminated  or revoked sooner.  Performed at [facility], [place] [address] [place], [place] [address]   MRSA PCR Screening     Status: None   Collection Time: 10/21/20  6:07 AM   Specimen: Nasopharyngeal  Result Value Ref Range Status   MRSA by PCR NEGATIVE NEGATIVE Final    Comment:        The GeneXpert MRSA Assay (FDA approved for NASAL specimens only), is one component of a comprehensive MRSA colonization surveillance program. It is not intended to diagnose MRSA infection nor to guide or monitor treatment for MRSA infections. Performed at [facility], [place] [address], [place], [place] [address]      Scheduled Meds: . insulin aspart  0-6 Units Subcutaneous TID WC  . mouth rinse  15 mL Mouth Rinse BID   Continuous Infusions: . sodium chloride 75 mL/hr at 10/22/20 0622  . ampicillin-sulbactam (UNASYN) IV 3 g (10/22/20 0931)     LOS: 1 day   Cherene Altes, MD [facility] Office  [phone] Pager - Text Page per Amion  If 7PM-7AM, please contact night-coverage per Amion 10/22/2020, 9:32 AM

## 2020-10-23 MED ORDER — SENNOSIDES-DOCUSATE SODIUM 8.6-50 MG PO TABS: 1.0000 | ORAL_TABLET | Freq: Two times a day (BID) | ORAL | Status: DC

## 2020-10-23 MED ORDER — SODIUM CHLORIDE 0.9 % IV SOLN
1.0000 g | Freq: Two times a day (BID) | INTRAVENOUS | Status: DC
  Administered 2020-10-23 – 2020-10-25 (×4): 1 g via INTRAVENOUS
  Filled 2020-10-23 (×5): qty 1

## 2020-10-23 MED ORDER — SENNOSIDES 8.8 MG/5ML PO SYRP
5.0000 mL | ORAL_SOLUTION | Freq: Two times a day (BID) | ORAL | Status: DC
  Administered 2020-10-23 – 2020-10-24 (×2): 5 mL via ORAL
  Filled 2020-10-23 (×6): qty 5

## 2020-10-23 NOTE — Progress Notes (Signed)
Pharmacy Antibiotic Note  Jose Castillo is a 80 y.o. male admitted on 10/21/2020 with aspiration at SNF.  Pharmacy has been consulted for meropenem dosing while ruling out ESBL organism in urine culture.  Plan: Meropenem 1g IV q12h Follow up renal function & final culture/sensitivity results  Height: 5\' 5"  (165.1 cm) Weight: 77.6 kg (171 lb 1.2 oz) IBW/kg (Calculated) : 61.5  Temp (24hrs), Avg:98.7 F (37.1 C), Min:98.6 F (37 C), Max:98.8 F (37.1 C)  Recent Labs  Lab 10/21/20 0207 10/21/20 0444 10/21/20 0853 10/22/20 0446  WBC 10.4  --   --  9.7  CREATININE 1.34*  --   --  1.20  LATICACIDVEN 2.1* 2.7* 2.4*  --     Estimated Creatinine Clearance: 47.2 mL/min (by C-G formula based on SCr of 1.2 mg/dL).    Allergies  Allergen Reactions  . Lortab [Hydrocodone-Acetaminophen] Other (See Comments)    Reaction:  Affected pts cognition   . Morphine And Related Other (See Comments)    Reaction:  Affected pts cognition     Antimicrobials this admission:  4/21 CTX/azithro x 1 4/21 Zosyn x 2 4/21 Unasyn >> 4/23 4/23 Meropenem >>  Dose adjustments this admission:   Microbiology results:  4/21 BCx: ngtd 4/21 UCx: >100k E.coli (pansensitive), 70k Klebsiella (pending)  Thank you for allowing pharmacy to be a part of this patient's care.  Peggyann Juba, PharmD, BCPS Pharmacy: [phone] 10/23/2020 12:54 PM

## 2020-10-23 NOTE — Progress Notes (Signed)
Jose Castillo  [id number] DOB: [birth year] DOA: 10/21/2020 PCP: [name], MD    Brief Narrative:  80 year old with a history of advanced dementia, HTN, DM 2 with peripheral neuropathy, CKD stage IIIa, and CAD status post PCI with stent placement who was transported to the ED after he was observed to suffer an episode of significant vomiting with aspiration at his SNF.  EMS found the patient with a temperature of 104 oxygen saturation of 89% on room air.  CXR in the ED noted multifocal airspace opacities bilaterally.  Consultants:  None  Code Status: NO CODE BLUE  Antimicrobials:  Azithromycin 4/20 Ceftriaxone 4/20 Zosyn 4/20 > 4/21 Unasyn 4/21 >  DVT prophylaxis: SCDs  Subjective: Afebrile.  Vital signs stable.  Has been successfully weaned to 2 L simple nasal cannula but sats 94-99%.  Yesterday the patient's attending RN was concerned that he may be experiencing pain.  Low-dose morphine was administered IV which the patient tolerated well and the impression was that it did in fact contribute to comfort and calmed and the patient.  The patient is much more calm at the time of my eval today.  He will actually make eye contact with me, though he does not interact with me.  He does not appear to be uncomfortable.  His respirations appear unlabored/comfortable.  Assessment & Plan:  Aspiration pneumonitis - sepsis No persisting signs or symptoms to suggest an acute infectious process - discontinue antibiotics after 3 days of treatment for this indication - wean to room air as able  Acute hypoxic respiratory failure Felt to be due to aspiration pneumonitis/chemical injury - continue to wean oxygen as possible  E coli and Klebsiella UTI  Patient has a reported history of recurrent UTIs per family - continue broad coverage for now and narrow as urine culture allows - expand to include consideration of ESBL organisms until Kleb data back   Nausea with vomiting Appears to  have been self-limited  Toxic metabolic encephalopathy on baseline advanced dementia Family reports that at baseline the patient is nonverbal and fidgets and moans in bed all day - it appears his mental status has returned to his usual baseline -continue low-dose as needed morphine as clinically it appears to have significantly impacted patient's agitation and contributed to increased comfort  DM2 A1c 7.1 -CBG controlled at this time  CKD stage IIIa Baseline creatinine 1.2-1.4 -creatinine improved to baseline range  Disposition Anticipate return to SNF as soon as urine culture allows adjustment and antibiotic therapy, hopefully transitioning to oral - perhaps Monday    Family Communication:   Status is: Inpatient  Remains inpatient appropriate because:Inpatient level of care appropriate due to severity of illness   Dispo: The patient is from: SNF              Anticipated d/c is to: SNF              Patient currently is not medically stable to d/c.   Difficult to place patient No   Objective: Blood pressure 117/64, pulse (!) 55, temperature 98.8 F (37.1 C), temperature source Oral, resp. rate 18, height 5\' 5"  (1.651 m), weight 77.6 kg, SpO2 94 %.  Intake/Output Summary (Last 24 hours) at 10/23/2020 0747 Last data filed at 10/23/2020 0400 Gross per 24 hour  Intake 1305.32 ml  Output 1450 ml  Net -144.68 ml   Filed Weights   10/21/20 0226 10/21/20 0542  Weight: 80 kg 77.6 kg    Examination: General:  No acute respiratory distress Lungs: Fine crackles scattered throughout bilateral fields - improved air movement th/o  Cardiovascular: RRR - no M  Abdomen: NT/ND, soft, bs+, no mass  Extremities: No significant edema bilateral lower extremities   CBC: Recent Labs  Lab 10/21/20 0207 10/22/20 0446  WBC 10.4 9.7  NEUTROABS 9.5*  --   HGB 12.8* 10.4*  HCT 39.8 32.1*  MCV 98.0 97.9  PLT 214 967   Basic Metabolic Panel: Recent Labs  Lab 10/21/20 0207 10/22/20 0446   NA 140 144  K 4.4 4.1  CL 108 112*  CO2 22 22  GLUCOSE 245* 133*  BUN 27* 25*  CREATININE 1.34* 1.20  CALCIUM 8.1* 8.2*  MG 1.9 1.8   GFR: Estimated Creatinine Clearance: 47.2 mL/min (by C-G formula based on SCr of 1.2 mg/dL).  Liver Function Tests: Recent Labs  Lab 10/21/20 0207 10/22/20 0446  AST 19 19  ALT 12 11  ALKPHOS 120 85  BILITOT 1.1 0.6  PROT 6.5 5.6*  ALBUMIN 3.7 3.1*    Coagulation Profile: Recent Labs  Lab 10/21/20 0207  INR 1.0    HbA1C: Hemoglobin A1C  Date/Time Value Ref Range Status  01/09/2019 12:00 AM 6.7  Final  06/23/2017 12:00 AM 6.8  Final   Hgb A1c MFr Bld  Date/Time Value Ref Range Status  10/21/2020 02:07 AM 7.1 (H) 4.8 - 5.6 % Final    Comment:    (NOTE) Pre diabetes:          5.7%-6.4%  Diabetes:              >6.4%  Glycemic control for   <7.0% adults with diabetes     CBG: Recent Labs  Lab 10/21/20 1619 10/22/20 0015 10/22/20 0611 10/22/20 1344 10/22/20 1743  GLUCAP 120* 129* 134* 114* 103*    Recent Results (from the past 240 hour(s))  Culture, blood (Routine x 2)     Status: None (Preliminary result)   Collection Time: 10/21/20  2:01 AM   Specimen: BLOOD  Result Value Ref Range Status   Specimen Description   Final    BLOOD BLOOD LEFT FOREARM Performed at [facility], [place] [address], [place], [place] [address]    Special Requests   Final    BOTTLES DRAWN AEROBIC AND ANAEROBIC Blood Culture adequate volume Performed at [place] [address], [place], [place] [address]    Culture   Final    NO GROWTH 1 DAY Performed at [facility], [place] [address] [place], [place] [address]    Report Status PENDING  Incomplete  Urine culture     Status: Abnormal (Preliminary result)   Collection Time: 10/21/20  2:01 AM   Specimen: Urine, Clean Catch  Result Value Ref Range Status   Specimen Description   Final    URINE, CLEAN CATCH Performed at [facility], [place] [address] [place], [place] [address]    Special Requests   Final    NONE Performed at [facility], [place] [address], [place], [place] [address]    Culture (A)  Final    >=100,000 COLONIES/mL GRAM NEGATIVE RODS 70,000 COLONIES/mL KLEBSIELLA PNEUMONIAE CULTURE REINCUBATED FOR BETTER GROWTH Performed at [facility], [place] [address], [place], [place] [address]    Report Status PENDING  Incomplete  Culture, blood (Routine x 2)     Status: None (Preliminary result)   Collection Time: 10/21/20  3:37 AM  Specimen: BLOOD  Result Value Ref Range Status   Specimen Description   Final    BLOOD BLOOD RIGHT FOREARM Performed at [place] [address] [place], [place] [address]    Special Requests   Final    BOTTLES DRAWN AEROBIC AND ANAEROBIC Blood Culture adequate volume Performed at [place] [address], [place], [place] [address]    Culture   Final    NO GROWTH 1 DAY Performed at [facility], [place] [address], [place], [place] [address]    Report Status PENDING  Incomplete  SARS CORONAVIRUS 2 (TAT 6-24 HRS) Nasopharyngeal Nasopharyngeal Swab     Status: None   Collection Time: 10/21/20  3:37 AM   Specimen: Nasopharyngeal Swab  Result Value Ref Range Status   SARS Coronavirus 2 NEGATIVE NEGATIVE Final    Comment: (NOTE) SARS-CoV-2 target nucleic acids are NOT DETECTED.  The SARS-CoV-2 RNA is generally detectable in upper and lower respiratory specimens during the acute phase of infection. Negative results do not preclude SARS-CoV-2 infection, do not rule out co-infections with other pathogens, and should not be used as the sole basis for treatment or other patient management decisions. Negative results must be combined with clinical observations, patient history, and epidemiological information. The expected result is Negative.  Fact Sheet for  Patients: SugarRoll.be  Fact Sheet for Healthcare Providers: https://www.woods-mathews.com/  This test is not yet approved or cleared by the [place] FDA and  has been authorized for detection and/or diagnosis of SARS-CoV-2 by FDA under an Emergency Use Authorization (EUA). This EUA will remain  in effect (meaning this test can be used) for the duration of the COVID-19 declaration under Se ction 564(b)(1) of the Act, 21 U.S.C. section 360bbb-3(b)(1), unless the authorization is terminated or revoked sooner.  Performed at [facility], [place] [address] [place], [place] [address]   MRSA PCR Screening     Status: None   Collection Time: 10/21/20  6:07 AM   Specimen: Nasopharyngeal  Result Value Ref Range Status   MRSA by PCR NEGATIVE NEGATIVE Final    Comment:        The GeneXpert MRSA Assay (FDA approved for NASAL specimens only), is one component of a comprehensive MRSA colonization surveillance program. It is not intended to diagnose MRSA infection nor to guide or monitor treatment for MRSA infections. Performed at [facility], [place] [address] [place], [place] [address]      Scheduled Meds: . mouth rinse  15 mL Mouth Rinse BID   Continuous Infusions: . ampicillin-sulbactam (UNASYN) IV 3 g (10/23/20 0324)     LOS: 2 days   Cherene Altes, MD [facility] Office  [phone] Pager - Text Page per Shea Evans  If 7PM-7AM, please contact night-coverage per Amion 10/23/2020, 7:47 AM

## 2020-10-24 DIAGNOSIS — L8990 Pressure ulcer of unspecified site, unspecified stage: Secondary | ICD-10-CM | POA: Diagnosis present

## 2020-10-24 LAB — URINE CULTURE: Culture: >=100000 — AB

## 2020-10-24 MED ORDER — OXYCODONE HCL 5 MG/5ML PO SOLN: 2.5000 mg | ORAL | Status: DC | PRN

## 2020-10-24 MED ORDER — OXYCODONE HCL 5 MG/5ML PO SOLN
5.0000 mg | ORAL | Status: DC | PRN
Start: 2020-10-24 — End: 2020-10-24

## 2020-10-24 NOTE — Progress Notes (Signed)
Morphine seemed to help pt pain. Music helps keep pt calm unless in pain/other needs. Have been given morphine after all other needs met and pain seems to be causing factor via PAIND scale.  Told pt his daughter came to see him and pt responded "ah good" which was the first comprehensible speech he has had with me. Explained bathing/oral care and pt made eye contact and was agreeable. Still moaning but not as much/as often. Would appreciate palliative consult as poor oral intake. May help pt receive pain relieving meds/comfort care at d/c via snf.

## 2020-10-24 NOTE — Progress Notes (Signed)
Jose Castillo  DGL:[id number] DOB: 1940-09-24 DOA: 10/21/2020 PCP: [name], MD    Brief Narrative:  80 year old with a history of advanced dementia, HTN, DM 2 with peripheral neuropathy, CKD stage IIIa, and CAD status post PCI with stent placement who was transported to the ED after he was observed to suffer an episode of significant vomiting with aspiration at his SNF.  EMS found the patient with a temperature of 104 oxygen saturation of 89% on room air.  CXR in the ED noted multifocal airspace opacities bilaterally.  Consultants:  None  Code Status: NO CODE BLUE  Antimicrobials:  Azithromycin 4/20 Ceftriaxone 4/20 Zosyn 4/20 > 4/21 Unasyn 4/21 >  DVT prophylaxis: SCDs  Subjective: Afebrile.  Vital signs stable.  Saturation 89% on room air.  Resting comfortably at the time of my visit with no evidence of respiratory distress or uncontrolled pain.  Assessment & Plan:  Aspiration pneumonitis - sepsis No persisting signs or symptoms to suggest an acute infectious process - discontinued antibiotics after 3 days of treatment for this indication - weaned to room air  Acute hypoxic respiratory failure Felt to be due to aspiration pneumonitis/chemical injury   E coli and Klebsiella UTI  Patient has a reported history of recurrent UTIs per family - continue broad coverage to include consideration of ESBL organisms until Kleb sensitivity data back   Nausea with vomiting Appears to have been self-limited  Toxic metabolic encephalopathy on baseline advanced dementia Family reports that at baseline the patient is nonverbal and fidgets and moans in bed all day - it appears his mental status has returned to his usual baseline -continue low-dose as needed morphine as clinically it appears to have significantly impacted patient's agitation and contributed to increased comfort - transition to narcotic liquid today in preparation for d/c to SNF   DM2 A1c 7.1 -CBG controlled at  this time  CKD stage IIIa Baseline creatinine 1.2-1.4 -creatinine improved to baseline range  Disposition Anticipate return to SNF Monday    Family Communication: Spoke with daughter via telephone Status is: Inpatient  Remains inpatient appropriate because:Inpatient level of care appropriate due to severity of illness   Dispo: The patient is from: SNF              Anticipated d/c is to: SNF              Patient currently is not medically stable to d/c.   Difficult to place patient No   Objective: Blood pressure 131/72, pulse 60, temperature 98.2 F (36.8 C), temperature source Oral, resp. rate 16, height 5\' 5"  (1.651 m), weight 77.6 kg, SpO2 (!) 89 %.  Intake/Output Summary (Last 24 hours) at 10/24/2020 0836 Last data filed at 10/24/2020 0500 Gross per 24 hour  Intake 250 ml  Output 1250 ml  Net -1000 ml   Filed Weights   10/21/20 0226 10/21/20 0542  Weight: 80 kg 77.6 kg    Examination: General: No acute respiratory distress -resting comfortably Lungs: Improved air movement throughout all fields with no focal crackles Cardiovascular: RRR  Abdomen: NT/ND, soft, bs+, no mass  Extremities: No significant edema bilateral lower extremities   CBC: Recent Labs  Lab 10/21/20 0207 10/22/20 0446  WBC 10.4 9.7  NEUTROABS 9.5*  --   HGB 12.8* 10.4*  HCT 39.8 32.1*  MCV 98.0 97.9  PLT 214 518   Basic Metabolic Panel: Recent Labs  Lab 10/21/20 0207 10/22/20 0446  NA 140 144  K 4.4 4.1  CL 108 112*  CO2 22 22  GLUCOSE 245* 133*  BUN 27* 25*  CREATININE 1.34* 1.20  CALCIUM 8.1* 8.2*  MG 1.9 1.8   GFR: Estimated Creatinine Clearance: 47.2 mL/min (by C-G formula based on SCr of 1.2 mg/dL).  Liver Function Tests: Recent Labs  Lab 10/21/20 0207 10/22/20 0446  AST 19 19  ALT 12 11  ALKPHOS 120 85  BILITOT 1.1 0.6  PROT 6.5 5.6*  ALBUMIN 3.7 3.1*    Coagulation Profile: Recent Labs  Lab 10/21/20 0207  INR 1.0    HbA1C: Hemoglobin A1C   Date/Time Value Ref Range Status  01/09/2019 12:00 AM 6.7  Final  06/23/2017 12:00 AM 6.8  Final   Hgb A1c MFr Bld  Date/Time Value Ref Range Status  10/21/2020 02:07 AM 7.1 (H) 4.8 - 5.6 % Final    Comment:    (NOTE) Pre diabetes:          5.7%-6.4%  Diabetes:              >6.4%  Glycemic control for   <7.0% adults with diabetes     CBG: Recent Labs  Lab 10/21/20 1619 10/22/20 0015 10/22/20 0611 10/22/20 1344 10/22/20 1743  GLUCAP 120* 129* 134* 114* 103*    Recent Results (from the past 240 hour(s))  Culture, blood (Routine x 2)     Status: None (Preliminary result)   Collection Time: 10/21/20  2:01 AM   Specimen: BLOOD  Result Value Ref Range Status   Specimen Description   Final    BLOOD BLOOD LEFT FOREARM Performed at [facility], [place], [place] [address], [place], [place] [address]    Special Requests   Final    BOTTLES DRAWN AEROBIC AND ANAEROBIC Blood Culture adequate volume Performed at [place] [address] [place], [place] [address]    Culture   Final    NO GROWTH 2 DAYS Performed at [place] [address] [place], [place] [address]    Report Status PENDING  Incomplete  Urine culture     Status: Abnormal (Preliminary result)   Collection Time: 10/21/20  2:01 AM   Specimen: Urine, Clean Catch  Result Value Ref Range Status   Specimen Description   Final    URINE, CLEAN CATCH Performed at [facility], [place] [address], [place], [place] [address]    Special Requests   Final    NONE Performed at [facility], [place] [address], [place], [place] [address]    Culture (A)  Final    >=100,000 COLONIES/mL ESCHERICHIA COLI 70,000 COLONIES/mL KLEBSIELLA PNEUMONIAE SUSCEPTIBILITIES TO FOLLOW Performed at [facility], [place] [address] [place],  [address]    Report Status PENDING  Incomplete   Organism ID, Bacteria ESCHERICHIA COLI (A)  Final       Susceptibility   Escherichia coli - MIC*    AMPICILLIN <=2 SENSITIVE Sensitive     CEFAZOLIN <=4 SENSITIVE Sensitive     CEFEPIME <=0.12 SENSITIVE Sensitive     CEFTRIAXONE <=0.25 SENSITIVE Sensitive     CIPROFLOXACIN <=0.25 SENSITIVE Sensitive     GENTAMICIN <=1 SENSITIVE Sensitive     IMIPENEM <=0.25 SENSITIVE Sensitive     NITROFURANTOIN <=16 SENSITIVE Sensitive     TRIMETH/SULFA <=20 SENSITIVE Sensitive     AMPICILLIN/SULBACTAM <=2 SENSITIVE Sensitive     PIP/TAZO <=4 SENSITIVE Sensitive     * >=100,000 COLONIES/mL ESCHERICHIA COLI  Culture, blood (Routine  x 2)     Status: None (Preliminary result)   Collection Time: 10/21/20  3:37 AM   Specimen: BLOOD  Result Value Ref Range Status   Specimen Description   Final    BLOOD BLOOD RIGHT FOREARM Performed at [place] [address] [place], [place] [address]    Special Requests   Final    BOTTLES DRAWN AEROBIC AND ANAEROBIC Blood Culture adequate volume Performed at [place] [address] [place], [place] [address]    Culture   Final    NO GROWTH 2 DAYS Performed at [place] [address], [place], [place] [address]    Report Status PENDING  Incomplete  SARS CORONAVIRUS 2 (TAT 6-24 HRS) Nasopharyngeal Nasopharyngeal Swab     Status: None   Collection Time: 10/21/20  3:37 AM   Specimen: Nasopharyngeal Swab  Result Value Ref Range Status   SARS Coronavirus 2 NEGATIVE NEGATIVE Final    Comment: (NOTE) SARS-CoV-2 target nucleic acids are NOT DETECTED.  The SARS-CoV-2 RNA is generally detectable in upper and lower respiratory specimens during the acute phase of infection. Negative results do not preclude SARS-CoV-2 infection, do not rule out co-infections with other pathogens, and should not be used as the sole basis for treatment or other patient management decisions. Negative results must be combined with clinical observations, patient history, and  epidemiological information. The expected result is Negative.  Fact Sheet for Patients: SugarRoll.be  Fact Sheet for Healthcare Providers: https://www.woods-mathews.com/  This test is not yet approved or cleared by the [place] FDA and  has been authorized for detection and/or diagnosis of SARS-CoV-2 by FDA under an Emergency Use Authorization (EUA). This EUA will remain  in effect (meaning this test can be used) for the duration of the COVID-19 declaration under Se ction 564(b)(1) of the Act, 21 U.S.C. section 360bbb-3(b)(1), unless the authorization is terminated or revoked sooner.  Performed at [facility], [place] [address], [place], [place] [address]   MRSA PCR Screening     Status: None   Collection Time: 10/21/20  6:07 AM   Specimen: Nasopharyngeal  Result Value Ref Range Status   MRSA by PCR NEGATIVE NEGATIVE Final    Comment:        The GeneXpert MRSA Assay (FDA approved for NASAL specimens only), is one component of a comprehensive MRSA colonization surveillance program. It is not intended to diagnose MRSA infection nor to guide or monitor treatment for MRSA infections. Performed at [facility], [place] [address], [place], [place] [address]      Scheduled Meds: . mouth rinse  15 mL Mouth Rinse BID  . sennosides  5 mL Oral BID   Continuous Infusions: . meropenem (MERREM) IV 1 g (10/24/20 0229)     LOS: 3 days   Cherene Altes, MD [facility] Office  [phone] Pager - Text Page per Amion  If 7PM-7AM, please contact night-coverage per Amion 10/24/2020, 8:36 AM

## 2020-10-25 LAB — RESP PANEL BY RT-PCR (FLU A&B, COVID) ARPGX2
Influenza A by PCR: NEGATIVE
Influenza B by PCR: NEGATIVE
SARS Coronavirus 2 by RT PCR: NEGATIVE

## 2020-10-25 MED ORDER — SENNOSIDES 8.8 MG/5ML PO SYRP: 5.0000 mL | ORAL_SOLUTION | Freq: Two times a day (BID) | ORAL | 0 refills | Status: DC

## 2020-10-25 MED ORDER — CIPROFLOXACIN HCL 500 MG PO TABS: 500.0000 mg | ORAL_TABLET | Freq: Two times a day (BID) | ORAL | 0 refills | Status: DC

## 2020-10-25 MED ORDER — OXYCODONE HCL 5 MG/5ML PO SOLN: 2.5000 mg | ORAL | 0 refills | Status: DC | PRN

## 2020-10-25 NOTE — Discharge Instructions (Signed)
Urinary Tract Infection, Adult  A urinary tract infection (UTI) is an infection of any part of the urinary tract. The urinary tract includes the kidneys, ureters, bladder, and urethra. These organs make, store, and get rid of urine in the body. An upper UTI affects the ureters and kidneys. A lower UTI affects the bladder and urethra. What are the causes? Most urinary tract infections are caused by bacteria in your genital area around your urethra, where urine leaves your body. These bacteria grow and cause inflammation of your urinary tract. What increases the risk? You are more likely to develop this condition if:  You have a urinary catheter that stays in place.  You are not able to control when you urinate or have a bowel movement (incontinence).  You are male and you: ? Use a spermicide or diaphragm for birth control. ? Have low estrogen levels. ? Are pregnant.  You have certain genes that increase your risk.  You are sexually active.  You take antibiotic medicines.  You have a condition that causes your flow of urine to slow down, such as: ? An enlarged prostate, if you are male. ? Blockage in your urethra. ? A kidney stone. ? A nerve condition that affects your bladder control (neurogenic bladder). ? Not getting enough to drink, or not urinating often.  You have certain medical conditions, such as: ? Diabetes. ? A weak disease-fighting system (immunesystem). ? Sickle cell disease. ? Gout. ? Spinal cord injury. What are the signs or symptoms? Symptoms of this condition include:  Needing to urinate right away (urgency).  Frequent urination. This may include small amounts of urine each time you urinate.  Pain or burning with urination.  Blood in the urine.  Urine that smells bad or unusual.  Trouble urinating.  Cloudy urine.  Vaginal discharge, if you are male.  Pain in the abdomen or the lower back. You may also have:  Vomiting or a decreased  appetite.  Confusion.  Irritability or tiredness.  A fever or chills.  Diarrhea. The first symptom in older adults may be confusion. In some cases, they may not have any symptoms until the infection has worsened. How is this diagnosed? This condition is diagnosed based on your medical history and a physical exam. You may also have other tests, including:  Urine tests.  Blood tests.  Tests for STIs (sexually transmitted infections). If you have had more than one UTI, a cystoscopy or imaging studies may be done to determine the cause of the infections. How is this treated? Treatment for this condition includes:  Antibiotic medicine.  Over-the-counter medicines to treat discomfort.  Drinking enough water to stay hydrated. If you have frequent infections or have other conditions such as a kidney stone, you may need to see a health care provider who specializes in the urinary tract (urologist). In rare cases, urinary tract infections can cause sepsis. Sepsis is a life-threatening condition that occurs when the body responds to an infection. Sepsis is treated in the hospital with IV antibiotics, fluids, and other medicines. Follow these instructions at home: Medicines  Take over-the-counter and prescription medicines only as told by your health care provider.  If you were prescribed an antibiotic medicine, take it as told by your health care provider. Do not stop using the antibiotic even if you start to feel better. General instructions  Make sure you: ? Empty your bladder often and completely. Do not hold urine for long periods of time. ? Empty your bladder after   sex. ? Wipe from front to back after urinating or having a bowel movement if you are male. Use each tissue only one time when you wipe.  Drink enough fluid to keep your urine pale yellow.  Keep all follow-up visits. This is important.   Contact a health care provider if:  Your symptoms do not get better after 1-2  days.  Your symptoms go away and then return. Get help right away if:  You have severe pain in your back or your lower abdomen.  You have a fever or chills.  You have nausea or vomiting. Summary  A urinary tract infection (UTI) is an infection of any part of the urinary tract, which includes the kidneys, ureters, bladder, and urethra.  Most urinary tract infections are caused by bacteria in your genital area.  Treatment for this condition often includes antibiotic medicines.  If you were prescribed an antibiotic medicine, take it as told by your health care provider. Do not stop using the antibiotic even if you start to feel better.  Keep all follow-up visits. This is important. This information is not intended to replace advice given to you by your health care provider. Make sure you discuss any questions you have with your health care provider. Document Revised: 01/30/2020 Document Reviewed: 01/30/2020 Elsevier Patient Education  2021 Elsevier Inc.  

## 2020-10-25 NOTE — Care Management Important Message (Signed)
Important Message  Patient Details IM Letter given to the Patient. Name: Jose Castillo MRN: [id number] Date of Birth: 1941-05-20   Medicare Important Message Given:  Yes     Kerin Salen 10/25/2020, 12:32 PM

## 2020-10-25 NOTE — TOC Transition Note (Addendum)
Transition of Care [facility]) - CM/SW Discharge Note   Patient Details  Name: Jose Castillo MRN: [id number] Date of Birth: 24-Jun-1941  [facility] CM/SW Contact:  Dessa Phi, RN Phone Number: 10/25/2020, 1:23 PM   Clinical Narrative: d/c back to [id number] [facility] call report tel#[phone]. Facility will accept today, they will check on covid results later.patient can return prior covid results.[facility] called for 2:30p. DNR not signed MD/Nsg notified..     Final next level of care: Long Term Nursing Home Barriers to Discharge: No Barriers Identified   Patient Goals and CMS Choice Patient states their goals for this hospitalization and ongoing recovery are:: return back to [place] [place] CMS Medicare.gov Compare Post Acute Care list provided to:: Patient Represenative (must comment) (dtr Kelly [phone]) Choice offered to / list presented to : Adult Children  Discharge Placement              Patient chooses bed at: Other - please specify in the comment section below: ([facility]) Patient to be transferred to facility by: [place] Name of family member notified: Claiborne Billings dtr [phone] Patient and family notified of of transfer: 10/25/20  Discharge Plan and Services   Discharge Planning Services: CM Consult Post Acute Care Choice: Nursing Home                               Social Determinants of Health (SDOH) Interventions     Readmission Risk Interventions No flowsheet data found.

## 2020-10-25 NOTE — Discharge Summary (Signed)
DISCHARGE SUMMARY  Jose Castillo  MR#: [id number]  DOB:1940-07-24  Date of Admission: 10/21/2020 Date of Discharge: 10/25/2020  Attending Physician:Terra Aveni Hennie Duos, MD  Patient's RCV:ELFYB, Rene Kocher, MD  Consults: none  Disposition: D/C to SNF   Follow-up Appts:  Follow-up Information    [name], MD Follow up in 7 day(s).   Specialty: Internal Medicine Contact information: [place] [id number] [phone]               Discharge Diagnoses: Aspiration pneumonitis - Sepsis POA Acute hypoxic respiratory failure E coli and Klebsiella UTI  Nausea with vomiting Toxic metabolic encephalopathy on baseline advanced dementia DM2 CKD stage IIIa NO CODE BLUE - DNR   Initial presentation: 79yo with a history of advanced dementia, HTN, DM 2 with peripheral neuropathy, CKD stage IIIa, and CAD status post PCI with stent placement who was transported to the ED after he was observed to suffer an episode of significant vomiting with aspiration at his SNF. EMS found the patient with a temperature of 104 oxygen saturation of 89% on room air. CXR in the ED noted multifocal airspace opacities bilaterally.  Hospital Course:  Aspiration pneumonitis - sepsis No persisting signs or symptoms to suggest an acute infectious process - discontinued antibiotics after 3 days of treatment for this indication - weaned to room air - resp stable at time of d/c   Acute hypoxic respiratory failure Felt to be due to aspiration pneumonitis/chemical injury - resolved   E coli and Klebsiella UTI  Patient has a reported history of recurrent UTIs per family - treated initially w/ broad coverage to include consideration of ESBL organisms until Kleb sensitivity returned - fortunately these organisms are not yet ESBL - transitioned to cipro at time of d/c to complete 3 additional days of tx   Nausea with vomiting Appears to have been self-limited  Toxic metabolic  encephalopathy on baseline advanced dementia Family reports that at baseline the patient is nonverbal and fidgets and moans in bed all day - it appears his mental status has returned to his usual baseline - while inpatient we utilized low-dose as needed morphine as clinically it appeared to have significantly impacted patient's agitation and contributed to increased comfort - transitioned to narcotic liquid prior to d/c for outpt use - goal is to avoid oversedation, but to assure pain treated and alleviate agitation - non-essential medications and medications not likely to benefit the quality of life of a patient in end stage dementia were discontinued during this admission   DM2 A1c 7.1 -CBG controlled during this hospital stay   CKD stage IIIa Baseline creatinine 1.2-1.4 -creatinine improved to baseline range   Allergies as of 10/25/2020      Reactions   Lortab [hydrocodone-acetaminophen] Other (See Comments)   Reaction:  Affected pts cognition    Morphine And Related Other (See Comments)   Reaction:  Affected pts cognition       Medication List    STOP taking these medications   allopurinol 100 MG tablet Commonly known as: ZYLOPRIM   carbamazepine 200 MG tablet Commonly known as: TEGRETOL   clopidogrel 75 MG tablet Commonly known as: PLAVIX   gabapentin 100 MG capsule Commonly known as: NEURONTIN   ipratropium-albuterol 0.5-2.5 (3) MG/3ML Soln Commonly known as: DUONEB   polyethylene glycol 17 g packet Commonly known as: MIRALAX / GLYCOLAX     TAKE these medications   acetaminophen 650 MG suppository Commonly known as: TYLENOL Place 650  mg rectally every 4 (four) hours as needed for fever.   acetaminophen 325 MG tablet Commonly known as: TYLENOL Take 650 mg by mouth 3 (three) times daily.   bisacodyl 10 MG suppository Commonly known as: DULCOLAX Place 10 mg rectally daily as needed (constipation).   ciprofloxacin 500 MG tablet Commonly known as: Cipro Take  1 tablet (500 mg total) by mouth 2 (two) times daily for 3 days. Start taking on: October 26, 2020   oxyCODONE 5 MG/5ML solution Commonly known as: ROXICODONE Take 2.5-5 mLs (2.5-5 mg total) by mouth every 4 (four) hours as needed for moderate pain.   sennosides 8.8 MG/5ML syrup Commonly known as: SENOKOT Take 5 mLs by mouth 2 (two) times daily.       Day of Discharge BP 114/64 (BP Location: Right Arm)   Pulse 60   Temp 98.9 F (37.2 C) (Oral)   Resp (!) 22   Ht 5\' 5"  (1.651 m)   Wt 77.6 kg   SpO2 90%   BMI 28.47 kg/m   Physical Exam: General: No acute respiratory distress Lungs: Clear to auscultation bilaterally without wheezes or crackles Cardiovascular: Regular rate and rhythm without murmur gallop or rub normal S1 and S2 Abdomen: Nontender, nondistended, soft, bowel sounds positive, no rebound, no ascites, no appreciable mass Extremities: No significant cyanosis, clubbing, or edema bilateral lower extremities  Basic Metabolic Panel: Recent Labs  Lab 10/21/20 0207 10/22/20 0446  NA 140 144  K 4.4 4.1  CL 108 112*  CO2 22 22  GLUCOSE 245* 133*  BUN 27* 25*  CREATININE 1.34* 1.20  CALCIUM 8.1* 8.2*  MG 1.9 1.8    Liver Function Tests: Recent Labs  Lab 10/21/20 0207 10/22/20 0446  AST 19 19  ALT 12 11  ALKPHOS 120 85  BILITOT 1.1 0.6  PROT 6.5 5.6*  ALBUMIN 3.7 3.1*    Coags: Recent Labs  Lab 10/21/20 0207  INR 1.0    CBC: Recent Labs  Lab 10/21/20 0207 10/22/20 0446  WBC 10.4 9.7  NEUTROABS 9.5*  --   HGB 12.8* 10.4*  HCT 39.8 32.1*  MCV 98.0 97.9  PLT 214 183    CBG: Recent Labs  Lab 10/21/20 1619 10/22/20 0015 10/22/20 0611 10/22/20 1344 10/22/20 1743  GLUCAP 120* 129* 134* 114* 103*    Recent Results (from the past 240 hour(s))  Culture, blood (Routine x 2)     Status: None (Preliminary result)   Collection Time: 10/21/20  2:01 AM   Specimen: BLOOD  Result Value Ref Range Status   Specimen Description   Final     BLOOD BLOOD LEFT FOREARM Performed at [facility], [place] [address] [place], [place] [address]    Special Requests   Final    BOTTLES DRAWN AEROBIC AND ANAEROBIC Blood Culture adequate volume Performed at [place] [address] [place], [place] [address]    Culture   Final    NO GROWTH 4 DAYS Performed at [facility], [place] [address] [place], [place] [address]    Report Status PENDING  Incomplete  Urine culture     Status: Abnormal   Collection Time: 10/21/20  2:01 AM   Specimen: Urine, Clean Catch  Result Value Ref Range Status   Specimen Description   Final    URINE, CLEAN CATCH Performed at [facility], [place] [address] [place], [place] [address]    Special Requests   Final  NONE Performed at [facility], [place] [address] [place], [place] [address]    Culture (A)  Final    >=100,000 COLONIES/mL ESCHERICHIA COLI 70,000 COLONIES/mL KLEBSIELLA PNEUMONIAE    Report Status 10/24/2020 FINAL  Final   Organism ID, Bacteria ESCHERICHIA COLI (A)  Final   Organism ID, Bacteria KLEBSIELLA PNEUMONIAE (A)  Final      Susceptibility   Escherichia coli - MIC*    AMPICILLIN <=2 SENSITIVE Sensitive     CEFAZOLIN <=4 SENSITIVE Sensitive     CEFEPIME <=0.12 SENSITIVE Sensitive     CEFTRIAXONE <=0.25 SENSITIVE Sensitive     CIPROFLOXACIN <=0.25 SENSITIVE Sensitive     GENTAMICIN <=1 SENSITIVE Sensitive     IMIPENEM <=0.25 SENSITIVE Sensitive     NITROFURANTOIN <=16 SENSITIVE Sensitive     TRIMETH/SULFA <=20 SENSITIVE Sensitive     AMPICILLIN/SULBACTAM <=2 SENSITIVE Sensitive     PIP/TAZO <=4 SENSITIVE Sensitive     * >=100,000 COLONIES/mL ESCHERICHIA COLI   Klebsiella pneumoniae - MIC*    AMPICILLIN RESISTANT Resistant     CEFAZOLIN <=4 SENSITIVE Sensitive     CEFEPIME <=0.12 SENSITIVE Sensitive     CEFTRIAXONE <=0.25 SENSITIVE Sensitive     CIPROFLOXACIN <=0.25 SENSITIVE Sensitive      GENTAMICIN <=1 SENSITIVE Sensitive     IMIPENEM <=0.25 SENSITIVE Sensitive     NITROFURANTOIN 64 INTERMEDIATE Intermediate     TRIMETH/SULFA <=20 SENSITIVE Sensitive     AMPICILLIN/SULBACTAM 8 SENSITIVE Sensitive     PIP/TAZO <=4 SENSITIVE Sensitive     * 70,000 COLONIES/mL KLEBSIELLA PNEUMONIAE  Culture, blood (Routine x 2)     Status: None (Preliminary result)   Collection Time: 10/21/20  3:37 AM   Specimen: BLOOD  Result Value Ref Range Status   Specimen Description   Final    BLOOD BLOOD RIGHT FOREARM Performed at [facility], [place] [address] [place], [place] [address]    Special Requests   Final    BOTTLES DRAWN AEROBIC AND ANAEROBIC Blood Culture adequate volume Performed at [place] [address], [place], [place] [address]    Culture   Final    NO GROWTH 4 DAYS Performed at [facility], [place] [address], [place], [place] [address]    Report Status PENDING  Incomplete  SARS CORONAVIRUS 2 (TAT 6-24 HRS) Nasopharyngeal Nasopharyngeal Swab     Status: None   Collection Time: 10/21/20  3:37 AM   Specimen: Nasopharyngeal Swab  Result Value Ref Range Status   SARS Coronavirus 2 NEGATIVE NEGATIVE Final    Comment: (NOTE) SARS-CoV-2 target nucleic acids are NOT DETECTED.  The SARS-CoV-2 RNA is generally detectable in upper and lower respiratory specimens during the acute phase of infection. Negative results do not preclude SARS-CoV-2 infection, do not rule out co-infections with other pathogens, and should not be used as the sole basis for treatment or other patient management decisions. Negative results must be combined with clinical observations, patient history, and epidemiological information. The expected result is Negative.  Fact Sheet for Patients: SugarRoll.be  Fact Sheet for Healthcare Providers: https://www.woods-mathews.com/  This test is not yet approved or cleared by  the [place] FDA and  has been authorized for detection and/or diagnosis of SARS-CoV-2 by FDA under an Emergency Use Authorization (EUA). This EUA will remain  in effect (meaning this test can be used) for the duration of the COVID-19 declaration under Se ction 564(b)(1) of the Act, 21 U.S.C. section 360bbb-3(b)(1), unless  the authorization is terminated or revoked sooner.  Performed at [facility], [place] [address], [place], [place] [address]   MRSA PCR Screening     Status: None   Collection Time: 10/21/20  6:07 AM   Specimen: Nasopharyngeal  Result Value Ref Range Status   MRSA by PCR NEGATIVE NEGATIVE Final    Comment:        The GeneXpert MRSA Assay (FDA approved for NASAL specimens only), is one component of a comprehensive MRSA colonization surveillance program. It is not intended to diagnose MRSA infection nor to guide or monitor treatment for MRSA infections. Performed at [facility], [place] [address] [place], [place] [address]      Time spent in discharge (includes decision making & examination of pt): 35 minutes  10/25/2020, 10:32 AM   Cherene Altes, MD [facility] Office  [phone]

## 2020-10-25 NOTE — Progress Notes (Signed)
Spoke to Therapist, sports at [facility]. Pt is waiting for [facility] for transport.

## 2020-10-26 LAB — CULTURE, BLOOD (ROUTINE X 2)
Culture: NO GROWTH
Culture: NO GROWTH
Special Requests: ADEQUATE
Special Requests: ADEQUATE

## 2020-10-29 ENCOUNTER — Encounter: Payer: Self-pay | Admitting: Adult Health

## 2020-10-29 ENCOUNTER — Non-Acute Institutional Stay (SKILLED_NURSING_FACILITY): Payer: PPO | Admitting: Adult Health

## 2020-10-29 DIAGNOSIS — J9601 Acute respiratory failure with hypoxia: Secondary | ICD-10-CM | POA: Diagnosis not present

## 2020-10-29 DIAGNOSIS — M170 Bilateral primary osteoarthritis of knee: Secondary | ICD-10-CM

## 2020-10-29 DIAGNOSIS — D631 Anemia in chronic kidney disease: Secondary | ICD-10-CM | POA: Diagnosis not present

## 2020-10-29 DIAGNOSIS — E1149 Type 2 diabetes mellitus with other diabetic neurological complication: Secondary | ICD-10-CM | POA: Diagnosis not present

## 2020-10-29 DIAGNOSIS — I25118 Atherosclerotic heart disease of native coronary artery with other forms of angina pectoris: Secondary | ICD-10-CM

## 2020-10-29 DIAGNOSIS — E1142 Type 2 diabetes mellitus with diabetic polyneuropathy: Secondary | ICD-10-CM | POA: Diagnosis not present

## 2020-10-29 DIAGNOSIS — E790 Hyperuricemia without signs of inflammatory arthritis and tophaceous disease: Secondary | ICD-10-CM

## 2020-10-29 DIAGNOSIS — J690 Pneumonitis due to inhalation of food and vomit: Secondary | ICD-10-CM

## 2020-10-29 DIAGNOSIS — R1312 Dysphagia, oropharyngeal phase: Secondary | ICD-10-CM

## 2020-10-29 DIAGNOSIS — N1832 Chronic kidney disease, stage 3b: Secondary | ICD-10-CM | POA: Diagnosis not present

## 2020-10-29 DIAGNOSIS — N3000 Acute cystitis without hematuria: Secondary | ICD-10-CM | POA: Diagnosis not present

## 2020-10-29 NOTE — Progress Notes (Signed)
Location:    [facility] Room Number: 114A Place of Service:  SNF [phone])  Provider: Royal Hawthorn, NP  PCP: [name], MD Patient Care Team: [name], MD as PCP - General (Internal Medicine)  Extended Emergency Contact Information Primary Emergency Contact: Alley,Kelly Address: [address]          [place], [place] [address] [name] of [place] Phone: [phone] Relation: Daughter Secondary Emergency Contact: Casalino,[name] Address: [address] [place] [address] [place] Phone: [phone] Relation: Son  Code Status: DNR Goals of care:  Advanced Directive information Advanced Directives 10/29/2020  Does Patient Have a Medical Advance Directive? Yes  Type of Advance Directive [place]  Does patient want to make changes to medical advance directive? -  Copy of [place] in Chart? Yes - validated most recent copy scanned in chart (See row information)  Would patient like information on creating a medical advance directive? -  Pre-existing out of facility DNR order (yellow form or pink MOST form) -     Allergies  Allergen Reactions  . Lortab [Hydrocodone-Acetaminophen] Other (See Comments)    Reaction:  Affected pts cognition   . Morphine And Related Other (See Comments)    Reaction:  Affected pts cognition     Chief Complaint  Patient presents with  . Hospitalization Follow-up    HPI:  80 y.o. male seen for hospital follow up s/p admission 10/21/20-10/25/20 due to aspiration pneumonitis, sepsis, acute hypoxic resp failure, Ecoli and Klebsiella UTI, nausea with vomiting, and TME.   He is a long term resident of [facility] with a PMH significant for frontal dementia with behaviors, DM2, CKD, neurophathy, CAD with stent, and, HLD,   At [facility] he was having nausea with vomiting and was thought to have aspirated with fever and decreased  02 sats. He was sent to the ER and admitted.   Chest x-ray showed multifocal airspace/interstitial opacities bilaterally consistent with multifocal pneumonia in the absence of overt edema, pleural effusion, or pneumothorax.  BP was running in the 80-90s and he was given IVF. Lactic acid was 2.1 Cr 1.34 WBC 10,400 with 93% neutrophils. He was treated with Zosyn and azithromycin and discharged with cipro for 3 additional days (now complete).   He is on room air now with sats in the 90's and in no acute distress.   He is back on a puree diet with NTL and tolerating well with no vomiting.   He is more alert and having periods of agitation which is not unusual for him. He is minimally verbal at baseline, non ambulatory, incontinent and requires assistance for all ADLs.   During his hospitalization non essential meds were discontinued and oxycodone was ordered for pain which he has not used at [facility].   Hospice was recommended and they will see him in the am   Past Medical History:  Diagnosis Date  . Dementia ([place])   . Diabetes ([place])   . Heart disease   . Hyperlipidemia   . Hypotension     Past Surgical History:  Procedure Laterality Date  . APPENDECTOMY  1961  . CORONARY ANGIOPLASTY WITH STENT PLACEMENT  2016  . TONSILLECTOMY AND ADENOIDECTOMY    . TOTAL KNEE ARTHROPLASTY  2012      reports that he quit smoking about 19 years ago. His smoking use included cigarettes. He has  a 10.00 pack-year smoking history. He has never used smokeless tobacco. He reports that he does not drink alcohol and does not use drugs. Social History   Socioeconomic History  . Marital status: Widowed    Spouse name: Not on file  . Number of children: 3  . Years of education: MBA  . Highest education level: Not on file  Occupational History  . Occupation: N/A  Tobacco Use  . Smoking status: Former Smoker    Packs/day: 0.50    Years: 20.00    Pack years: 10.00    Types: Cigarettes    Quit date:  2003    Years since quitting: 19.3  . Smokeless tobacco: Never Used  Vaping Use  . Vaping Use: Never used  Substance and Sexual Activity  . Alcohol use: No    Comment: Quit 2013  . Drug use: No  . Sexual activity: Never  Other Topics Concern  . Not on file  Social History Narrative   Right-handed      Do you drink/eat things with caffeine? occasional diet Coke      Marital status?  widowed                        What year were you married?  1966      Do you live in a house, apartment, assisted living, condo, trailer, etc.? [place]      Is it one or more stories? one      How many persons live in your home?      Do you have any pets in your home? (please list) none      Current or past profession: financial      Do you exercise?   yes                                   Type & how often? 1/day      Do you have a living will? yes      Do you have a DNR form?                                  If not, do you want to discuss one?      Do you have signed POA/HPOA for forms?    Social Determinants of Health   Financial Resource Strain: Not on file  Food Insecurity: Not on file  Transportation Needs: Not on file  Physical Activity: Not on file  Stress: Not on file  Social Connections: Not on file  Intimate Partner Violence: Not on file   Functional Status Survey:    Allergies  Allergen Reactions  . Lortab [Hydrocodone-Acetaminophen] Other (See Comments)    Reaction:  Affected pts cognition   . Morphine And Related Other (See Comments)    Reaction:  Affected pts cognition     Pertinent  Health Maintenance Due  Topic Date Due  . INFLUENZA VACCINE  01/31/2021  . PNA vac Low Risk Adult  Completed    Medications: Allergies as of 10/29/2020      Reactions   Lortab [hydrocodone-acetaminophen] Other (See Comments)   Reaction:  Affected pts cognition    Morphine And Related Other (See Comments)   Reaction:  Affected pts cognition       Medication  List  Accurate as of October 29, 2020  9:42 AM. If you have any questions, ask your nurse or doctor.        STOP taking these medications   ciprofloxacin 500 MG tablet Commonly known as: Cipro Stopped by: Royal Hawthorn, NP     TAKE these medications   acetaminophen 650 MG suppository Commonly known as: TYLENOL Place 650 mg rectally every 4 (four) hours as needed for fever.   acetaminophen 325 MG tablet Commonly known as: TYLENOL Take 650 mg by mouth 3 (three) times daily.   bisacodyl 10 MG suppository Commonly known as: DULCOLAX Place 10 mg rectally daily as needed (constipation).   oxyCODONE 5 MG/5ML solution Commonly known as: ROXICODONE Take 2.5-5 mLs (2.5-5 mg total) by mouth every 4 (four) hours as needed for moderate pain.   sennosides 8.8 MG/5ML syrup Commonly known as: SENOKOT Take 5 mLs by mouth 2 (two) times daily.       Review of Systems  Unable to perform ROS: Dementia    Vitals:   10/29/20 0932  BP: 127/70  Pulse: 64  Temp: (!) 97.2 F (36.2 C)  SpO2: 90%  Weight: 164 lb 12.8 oz (74.8 kg)  Height: 5\' 5"  (1.651 m)   Body mass index is 27.42 kg/m. Physical Exam Vitals and nursing note reviewed.  Constitutional:      General: He is not in acute distress.    Appearance: He is not diaphoretic.     Comments: Sleepy but arouses to physical stim  HENT:     Head: Normocephalic and atraumatic.     Nose: Nose normal. No congestion.     Mouth/Throat:     Mouth: Mucous membranes are moist.     Pharynx: Oropharynx is clear. No oropharyngeal exudate.  Eyes:     General:        Right eye: No discharge.        Left eye: No discharge.     Conjunctiva/sclera: Conjunctivae normal.     Comments: Left pupil is oval and reactive. Right pupil ERRL  Neck:     Thyroid: No thyromegaly.     Vascular: No JVD.     Trachea: No tracheal deviation.  Cardiovascular:     Rate and Rhythm: Normal rate and regular rhythm.     Heart sounds: No murmur  heard.   Pulmonary:     Effort: Pulmonary effort is normal. No respiratory distress.     Breath sounds: Normal breath sounds. No wheezing.  Abdominal:     General: Bowel sounds are normal. There is no distension.     Palpations: Abdomen is soft.     Tenderness: There is no abdominal tenderness.  Musculoskeletal:        General: No swelling, tenderness, deformity or signs of injury.     Cervical back: No rigidity.     Right lower leg: No edema.     Left lower leg: No edema.  Lymphadenopathy:     Cervical: No cervical adenopathy.  Skin:    General: Skin is warm and dry.  Neurological:     General: No focal deficit present.     Mental Status: Mental status is at baseline.  Psychiatric:     Comments: calm     Labs reviewed: Basic Metabolic Panel: Recent Labs    12/21/19 2333 10/21/20 0207 10/22/20 0446  NA 139 140 144  K 4.8 4.4 4.1  CL 104 108 112*  CO2 27 22 22   GLUCOSE 173* 245* 133*  BUN 18 27* 25*  CREATININE 1.20 1.34* 1.20  CALCIUM 8.9 8.1* 8.2*  MG  --  1.9 1.8   Liver Function Tests: Recent Labs    12/21/19 2333 10/21/20 0207 10/22/20 0446  AST 24 19 19   ALT 17 12 11   ALKPHOS 120 120 85  BILITOT 0.4 1.1 0.6  PROT 6.6 6.5 5.6*  ALBUMIN 3.7 3.7 3.1*   No results for input(s): LIPASE, AMYLASE in the last 8760 hours. No results for input(s): AMMONIA in the last 8760 hours. CBC: Recent Labs    12/21/19 2333 10/21/20 0207 10/22/20 0446  WBC 7.8 10.4 9.7  NEUTROABS 5.4 9.5*  --   HGB 13.1 12.8* 10.4*  HCT 40.3 39.8 32.1*  MCV 95.5 98.0 97.9  PLT 219 214 183   Cardiac Enzymes: No results for input(s): CKTOTAL, CKMB, CKMBINDEX, TROPONINI in the last 8760 hours. BNP: Invalid input(s): POCBNP CBG: Recent Labs    10/22/20 0611 10/22/20 1344 10/22/20 1743  GLUCAP 134* 114* 103*    Procedures and Imaging Studies During Stay: DG Chest 2 View  Result Date: 10/21/2020 CLINICAL DATA:  Fever, vomiting, dementia, suspected sepsis EXAM: CHEST -  2 VIEW COMPARISON:  Radiograph 07/27/2016 FINDINGS: Widespread multifocal mixed consolidative airspace and interstitial opacity throughout both lungs. No pneumothorax or visible effusion. Prominent cardiomediastinal silhouette though may be accentuated by low volumes and portable technique. No acute osseous or soft tissue abnormality. Telemetry leads overlie the chest. IMPRESSION: Widespread multifocal mixed consolidative airspace and interstitial opacity throughout both lungs, consistent with multifocal pneumonia with a differential which could include atypical viral etiologies such as COVID-19. Electronically Signed   By: Lovena Le M.D.   On: 10/21/2020 02:40    Assessment/Plan:    1. Aspiration pneumonitis (HCC) Complete course of Zosyn No concerns s/s of distress   2. Acute respiratory failure with hypoxia ([place]) On room air  resolved  3. Acute cystitis without hematuria Completed Cipro  No further symptoms   4. Anemia due to stage 3b chronic kidney disease (HCC) Lab Results  Component Value Date   HGB 10.4 (L) 10/22/2020  2 gram drop from baseline with no acute bleeding and normal MCV likely due to acute illness and given his progressive dementia would not work up further    5. Stage 3b chronic kidney disease ([place]) Lab Results  Component Value Date   BUN 25 (H) 10/22/2020   Lab Results  Component Value Date   CREATININE 1.20 10/22/2020    6. Type 2 diabetes mellitus with neurological complications [facility]) Lab Results  Component Value Date   HGBA1C 7.1 (H) 10/21/2020   Continue diet management only Goal A1C<8%  7. Diabetic peripheral neuropathy associated with type 2 diabetes mellitus (HCC) Off neurontin and does not appear to be in pain  8. Coronary artery disease of native artery of native heart with stable angina pectoris ([place]) Off plavix per hospital recommendation   9. Oropharyngeal dysphagia Continue puree diet with NTL  Asp prec 1:1 supervision with meals    10. Hyperuricemia Off allopurinol Monitor for for gouty attacks  11. Bilateral primary osteoarthritis of knee No current complaints of pain but has oxycodone prn if needed   Mr Haddox has severe dementia with dysphagia and has a DNR in placed and a most form indicating limited intervention.  Hospice to consult in the am   Future labs/tests needed:  NA

## 2020-11-04 ENCOUNTER — Telehealth: Payer: Self-pay | Admitting: Adult Health

## 2020-11-04 NOTE — Telephone Encounter (Signed)
Return call placed to resident's daughter Jose Castillo. She expressed concern that she was not notified by the hospital that they had discontinued non essential meds and that hospice was consulted. I expressed my apologies. Jose Castillo does agree with a comfort care approach and would like to change [name] most form to "no hospitalizations".  Completed by SW

## 2020-11-15 ENCOUNTER — Encounter: Payer: Self-pay | Admitting: Internal Medicine

## 2020-11-15 ENCOUNTER — Non-Acute Institutional Stay (SKILLED_NURSING_FACILITY): Admitting: Internal Medicine

## 2020-11-15 DIAGNOSIS — R1312 Dysphagia, oropharyngeal phase: Secondary | ICD-10-CM | POA: Diagnosis not present

## 2020-11-15 DIAGNOSIS — E1149 Type 2 diabetes mellitus with other diabetic neurological complication: Secondary | ICD-10-CM

## 2020-11-15 DIAGNOSIS — N1832 Chronic kidney disease, stage 3b: Secondary | ICD-10-CM

## 2020-11-15 DIAGNOSIS — F0390 Unspecified dementia without behavioral disturbance: Secondary | ICD-10-CM | POA: Diagnosis not present

## 2020-11-15 MED ORDER — LORAZEPAM 0.5 MG PO TABS: 0.5000 mg | ORAL_TABLET | Freq: Four times a day (QID) | ORAL | 0 refills | Status: DC | PRN

## 2020-11-15 NOTE — Progress Notes (Signed)
Location:  Occupational psychologist of Service:  SNF (31)  Provider:   Code Status: DNR Jose Castillo Goals of Care:  Advanced Directives 10/29/2020  Does Patient Have a Medical Advance Directive? Yes  Type of Advance Directive [place]  Does patient want to make changes to medical advance directive? -  Copy of [place] in Chart? Yes - validated most recent copy scanned in chart (See row information)  Would patient like information on creating a medical advance directive? -  Pre-existing out of facility DNR order (yellow form or pink MOST form) -     Chief Complaint  Patient presents with  . Medical Management of Chronic Issues    HPI: Patient is a 80 y.o. male seen today for medical management of chronic diseases.   Patient with End Stage Dementia, Dysphagia, Recurent UTI, CKD CAD s/p Stent Placement  Admitted in hospital from 4/21-4/25 for Aspiration Pneumonia and Respiratory Failure with UTI Family decided to make him Hospice He was taken off all his Meds and started on Haldol and Oxycodone by Hospice Patient had very restless day over the weekend Was agitated thought did respond to haldol. This morning there were no new issues.  Patient was not very responsive Does open his eyes to his name   Past Medical History:  Diagnosis Date  . Dementia ([place])   . Diabetes ([place])   . Heart disease   . Hyperlipidemia   . Hypotension     Past Surgical History:  Procedure Laterality Date  . APPENDECTOMY  1961  . CORONARY ANGIOPLASTY WITH STENT PLACEMENT  2016  . TONSILLECTOMY AND ADENOIDECTOMY    . TOTAL KNEE ARTHROPLASTY  2012    Allergies  Allergen Reactions  . Lortab [Hydrocodone-Acetaminophen] Other (See Comments)    Reaction:  Affected pts cognition   . Morphine And Related Other (See Comments)    Reaction:  Affected pts cognition     Outpatient Encounter Medications as of 11/15/2020  Medication Sig  .  haloperidol (HALDOL) 0.5 MG tablet Take 0.5 mg by mouth every 6 (six) hours as needed for agitation.  Marland Kitchen acetaminophen (TYLENOL) 325 MG tablet Take 650 mg by mouth 3 (three) times daily.  Marland Kitchen acetaminophen (TYLENOL) 650 MG suppository Place 650 mg rectally every 4 (four) hours as needed for fever.  . bisacodyl (DULCOLAX) 10 MG suppository Place 10 mg rectally daily as needed (constipation).   Marland Kitchen oxyCODONE (ROXICODONE) 5 MG/5ML solution Take 2.5-5 mLs (2.5-5 mg total) by mouth every 4 (four) hours as needed for moderate pain.  Marland Kitchen sennosides (SENOKOT) 8.8 MG/5ML syrup Take 5 mLs by mouth 2 (two) times daily.   No facility-administered encounter medications on file as of 11/15/2020.    Review of Systems:  Review of Systems  Unable to perform ROS: Dementia    Health Maintenance  Topic Date Due  . COVID-19 Vaccine (4 - Booster for Moderna series) 08/13/2020  . INFLUENZA VACCINE  01/31/2021  . TETANUS/TDAP  06/02/2027  . PNA vac Low Risk Adult  Completed  . HPV VACCINES  Aged Out    Physical Exam: Vitals:   11/15/20 1359  BP: 115/68  Pulse: 60  Resp: 14  Temp: (!) 96.8 F (36 C)   There is no height or weight on file to calculate BMI. Physical Exam Vitals reviewed.  Constitutional:      Comments: Will respond to his name but mostly Sleepy  HENT:     Head: Normocephalic.  Nose: Nose normal.     Mouth/Throat:     Mouth: Mucous membranes are moist.     Pharynx: Oropharynx is clear.  Eyes:     Pupils: Pupils are equal, round, and reactive to light.  Cardiovascular:     Rate and Rhythm: Normal rate and regular rhythm.     Pulses: Normal pulses.     Heart sounds: Normal heart sounds.  Pulmonary:     Effort: Pulmonary effort is normal.     Breath sounds: Normal breath sounds.  Abdominal:     General: Abdomen is flat. Bowel sounds are normal.     Palpations: Abdomen is soft.  Musculoskeletal:        General: No swelling.     Cervical back: Neck supple.  Skin:    General:  Skin is warm.  Neurological:     Comments: Contractures in UE  Psychiatric:        Mood and Affect: Mood normal.        Thought Content: Thought content normal.     Labs reviewed: Basic Metabolic Panel: Recent Labs    12/21/19 2333 10/21/20 0207 10/22/20 0446  NA 139 140 144  K 4.8 4.4 4.1  CL 104 108 112*  CO2 27 22 22   GLUCOSE 173* 245* 133*  BUN 18 27* 25*  CREATININE 1.20 1.34* 1.20  CALCIUM 8.9 8.1* 8.2*  MG  --  1.9 1.8   Liver Function Tests: Recent Labs    12/21/19 2333 10/21/20 0207 10/22/20 0446  AST 24 19 19   ALT 17 12 11   ALKPHOS 120 120 85  BILITOT 0.4 1.1 0.6  PROT 6.6 6.5 5.6*  ALBUMIN 3.7 3.7 3.1*   No results for input(s): LIPASE, AMYLASE in the last 8760 hours. No results for input(s): AMMONIA in the last 8760 hours. CBC: Recent Labs    12/21/19 2333 10/21/20 0207 10/22/20 0446  WBC 7.8 10.4 9.7  NEUTROABS 5.4 9.5*  --   HGB 13.1 12.8* 10.4*  HCT 40.3 39.8 32.1*  MCV 95.5 98.0 97.9  PLT 219 214 183   Lipid Panel: No results for input(s): CHOL, HDL, LDLCALC, TRIG, CHOLHDL, LDLDIRECT in the last 8760 hours. Lab Results  Component Value Date   HGBA1C 7.1 (H) 10/21/2020    Procedures since last visit: DG Chest 2 View  Result Date: 10/21/2020 CLINICAL DATA:  Fever, vomiting, dementia, suspected sepsis EXAM: CHEST - 2 VIEW COMPARISON:  Radiograph 07/27/2016 FINDINGS: Widespread multifocal mixed consolidative airspace and interstitial opacity throughout both lungs. No pneumothorax or visible effusion. Prominent cardiomediastinal silhouette though may be accentuated by low volumes and portable technique. No acute osseous or soft tissue abnormality. Telemetry leads overlie the chest. IMPRESSION: Widespread multifocal mixed consolidative airspace and interstitial opacity throughout both lungs, consistent with multifocal pneumonia with a differential which could include atypical viral etiologies such as COVID-19. Electronically Signed   By: Lovena Le M.D.   On: 10/21/2020 02:40    Assessment/Plan  Dementia without behavioral disturbance, unspecified dementia type ([place]) With CKD, Dysphagia and Failure to thrive Now Enrolled in hospice care Nurses need to use Haldol PRN Also on Oxycodone for now Will consider Ativan if Haldol not effective No Blood Work due to Goals of Care    Labs/tests ordered:  * No order type specified * Next appt:  Visit date not found

## 2020-12-10 ENCOUNTER — Encounter: Payer: Self-pay | Admitting: Adult Health

## 2020-12-10 ENCOUNTER — Non-Acute Institutional Stay (SKILLED_NURSING_FACILITY): Payer: PPO | Admitting: Adult Health

## 2020-12-10 DIAGNOSIS — G3109 Other frontotemporal dementia: Secondary | ICD-10-CM | POA: Diagnosis not present

## 2020-12-10 DIAGNOSIS — E1149 Type 2 diabetes mellitus with other diabetic neurological complication: Secondary | ICD-10-CM | POA: Diagnosis not present

## 2020-12-10 DIAGNOSIS — M170 Bilateral primary osteoarthritis of knee: Secondary | ICD-10-CM | POA: Diagnosis not present

## 2020-12-10 DIAGNOSIS — R634 Abnormal weight loss: Secondary | ICD-10-CM | POA: Diagnosis not present

## 2020-12-10 DIAGNOSIS — F0280 Dementia in other diseases classified elsewhere without behavioral disturbance: Secondary | ICD-10-CM | POA: Diagnosis not present

## 2020-12-10 DIAGNOSIS — R1312 Dysphagia, oropharyngeal phase: Secondary | ICD-10-CM | POA: Diagnosis not present

## 2020-12-10 DIAGNOSIS — E1142 Type 2 diabetes mellitus with diabetic polyneuropathy: Secondary | ICD-10-CM

## 2020-12-10 NOTE — Progress Notes (Signed)
Location:   [facility] Room Number: 114 Place of Service:  SNF [phone]) Provider:  Royal Hawthorn, NP  [name], MD  Patient Care Team: [name], MD as PCP - General (Internal Medicine)  Extended Emergency Contact Information Primary Emergency Contact: Alley,Kelly Address: [address]          [place], [place] [address] [name] of [place] Phone: [phone] Relation: Daughter Secondary Emergency Contact: Sipp,[name] Address: [address] [place] [address] [place] Phone: [phone] Relation: Son  Code Status:  DNR Hospice Goals of care: Advanced Directive information Advanced Directives 12/10/2020  Does Patient Have a Medical Advance Directive? Yes  Type of Advance Directive [place]  Does patient want to make changes to medical advance directive? No - Patient declined  Copy of [place] in Chart? Yes - validated most recent copy scanned in chart (See row information)  Would patient like information on creating a medical advance directive? -  Pre-existing out of facility DNR order (yellow form or pink MOST form) -     Chief Complaint  Patient presents with   Medical Management of Chronic Issues   Health Maintenance    #4 Covid    HPI:  Pt is a 80 y.o. male seen today for medical management of chronic diseases.  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 He resides in skilled care and requires assistance for all dressing, bathing, and feeding. He is incontinent of bowel and bladder. He is progressively losing weight, down by 17 lbs since April.    Wt Readings from Last 3 Encounters:  12/10/20 154 lb 9.6 oz (70.1 kg)  10/29/20 164 lb 12.8 oz (74.8 kg)  10/21/20 171 lb 1.2 oz (77.6 kg)   He has issues with agitation (grabbing and yelling) and hospice has prescribed haldol. Prior to a hospital admission for UTI he was using tegretol  and neurontin.   Of note he has found on the mat on the floor by his bed due to attempting to get out of bed without help.   Continues on puree diet and thickened liquids. No choking or sob reported   Prn oxycodone is ordered for any perceived pain but has not been used.   Past Medical History:  Diagnosis Date   Dementia ([place])    Diabetes ([place])    Heart disease    Hyperlipidemia    Hypotension    Past Surgical History:  Procedure Laterality Date   APPENDECTOMY  1961   CORONARY ANGIOPLASTY WITH STENT PLACEMENT  2016   TONSILLECTOMY AND ADENOIDECTOMY     TOTAL KNEE ARTHROPLASTY  2012    Allergies  Allergen Reactions   Lortab [Hydrocodone-Acetaminophen] Other (See Comments)    Reaction:  Affected pts cognition    Morphine And Related Other (See Comments)    Reaction:  Affected pts cognition     Allergies as of 12/10/2020       Reactions   Lortab [hydrocodone-acetaminophen] Other (See Comments)   Reaction:  Affected pts cognition    Morphine And Related Other (See Comments)   Reaction:  Affected pts cognition         Medication List        Accurate as of December 10, 2020  9:51 AM. If you have any questions, ask your nurse or doctor.  acetaminophen 650 MG suppository Commonly known as: TYLENOL Place 650 mg rectally every 4 (four) hours as needed for fever.   acetaminophen 325 MG tablet Commonly known as: TYLENOL Take 650 mg by mouth 3 (three) times daily.   bisacodyl 10 MG suppository Commonly known as: DULCOLAX Place 10 mg rectally daily as needed (constipation).   haloperidol 0.5 MG tablet Commonly known as: HALDOL Take 0.5 mg by mouth every 6 (six) hours as needed for agitation.   haloperidol 0.5 MG tablet Commonly known as: HALDOL Take 0.5 mg by mouth at bedtime.   LORazepam 0.5 MG tablet Commonly known as: Ativan Take 1 tablet (0.5 mg total) by mouth every 6 (six) hours as needed for anxiety.   oxyCODONE 5 MG/5ML solution Commonly  known as: ROXICODONE Take 2.5-5 mLs (2.5-5 mg total) by mouth every 4 (four) hours as needed for moderate pain.   sennosides 8.8 MG/5ML syrup Commonly known as: SENOKOT Take 5 mLs by mouth 2 (two) times daily.        Review of Systems  Unable to perform ROS: Dementia   Immunization History  Administered Date(s) Administered   Influenza, High Dose Seasonal PF 04/17/2019, 04/23/2020   Influenza,inj,Quad PF,6+ Mos 04/23/2018   Influenza-Unspecified 05/05/2010, 04/11/2013, 03/03/2015, 09/06/2016, 05/10/2017, 04/27/2020   Moderna SARS-COV2 Booster Vaccination 05/13/2020   Moderna Sars-Covid-2 Vaccination 07/08/2019, 08/05/2019   Pneumococcal Polysaccharide-23 12/25/2010   Pneumococcal-Unspecified 02/28/2013   Td 06/01/2017   Tdap 03/22/2006   Zoster Recombinat (Shingrix) 05/25/2017, 09/25/2017   Zoster, Live 11/01/2012   Pertinent  Health Maintenance Due  Topic Date Due   INFLUENZA VACCINE  01/31/2021   PNA vac Low Risk Adult  Completed   Fall Risk  07/27/2020 05/08/2019 09/21/2018 05/29/2018 05/29/2017  Falls in the past year? 1 1 Exclusion - non ambulatory 0 No  Number falls in past yr: 0 1 - 0 -  Injury with Fall? 1 1 - 0 -  Risk for fall due to : History of fall(s);Medication side effect History of fall(s);Impaired balance/gait - - -  Follow up Falls evaluation completed Falls evaluation completed - - -   Functional Status Survey:    Vitals:   12/10/20 0926  BP: 102/69  Pulse: 71  Resp: 16  Temp: (!) 97.3 F (36.3 C)  SpO2: 98%  Weight: 154 lb 9.6 oz (70.1 kg)  Height: 5\' 5"  (1.651 m)   Body mass index is 25.73 kg/m. Physical Exam Vitals and nursing note reviewed.  Constitutional:      General: He is not in acute distress.    Appearance: He is not diaphoretic.  HENT:     Head: Normocephalic and atraumatic.     Mouth/Throat:     Mouth: Mucous membranes are moist.     Pharynx: Oropharynx is clear.  Neck:     Thyroid: No thyromegaly.     Vascular: No JVD.      Trachea: No tracheal deviation.  Cardiovascular:     Rate and Rhythm: Normal rate and regular rhythm.     Heart sounds: No murmur heard. Pulmonary:     Effort: Pulmonary effort is normal. No respiratory distress.     Breath sounds: Normal breath sounds. No wheezing.  Abdominal:     General: Bowel sounds are normal. There is no distension.     Palpations: Abdomen is soft.     Tenderness: There is no abdominal tenderness.  Musculoskeletal:     Cervical back: Normal range of motion and neck supple.  Lymphadenopathy:  Cervical: No cervical adenopathy.  Skin:    General: Skin is warm and dry.  Neurological:     Mental Status: He is alert.     Comments: Yes open, mumbling. Not able to f/c.  No obvious focal deficit    Labs reviewed: Recent Labs    12/21/19 2333 10/21/20 0207 10/22/20 0446  NA 139 140 144  K 4.8 4.4 4.1  CL 104 108 112*  CO2 27 22 22   GLUCOSE 173* 245* 133*  BUN 18 27* 25*  CREATININE 1.20 1.34* 1.20  CALCIUM 8.9 8.1* 8.2*  MG  --  1.9 1.8   Recent Labs    12/21/19 2333 10/21/20 0207 10/22/20 0446  AST 24 19 19   ALT 17 12 11   ALKPHOS 120 120 85  BILITOT 0.4 1.1 0.6  PROT 6.6 6.5 5.6*  ALBUMIN 3.7 3.7 3.1*   Recent Labs    12/21/19 2333 10/21/20 0207 10/22/20 0446  WBC 7.8 10.4 9.7  NEUTROABS 5.4 9.5*  --   HGB 13.1 12.8* 10.4*  HCT 40.3 39.8 32.1*  MCV 95.5 98.0 97.9  PLT 219 214 183   Lab Results  Component Value Date   TSH 2.45 01/09/2019   Lab Results  Component Value Date   HGBA1C 7.1 (H) 10/21/2020   Lab Results  Component Value Date   CHOL 124 10/26/2016   HDL 40 10/26/2016   LDLCALC 62 10/26/2016   TRIG 111 10/26/2016    Significant Diagnostic Results in last 30 days:  No results found.  Assessment/Plan  1. Weight loss Due to progressive dementia and dysphagia Followed by hospice  2. Oropharyngeal dysphagia Continue D1 diet Asp prec 1:1 supervision  3. 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 Diabetic peripheral neuropathy associated with type 2 diabetes mellitus (HCC) No current issues with pain or foot ulceratoin   5. 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   6. Bilateral primary osteoarthritis of knee No current issues with pain Continue tylenol and prn oxycodone for pain or if he progresses to end of life.    Family/ staff Communication: nurse  Labs/tests ordered:  NA

## 2021-01-04 ENCOUNTER — Other Ambulatory Visit: Payer: Self-pay | Admitting: Orthopedic Surgery

## 2021-01-04 DIAGNOSIS — F0390 Unspecified dementia without behavioral disturbance: Secondary | ICD-10-CM

## 2021-01-04 DIAGNOSIS — F419 Anxiety disorder, unspecified: Secondary | ICD-10-CM

## 2021-01-04 DIAGNOSIS — Z515 Encounter for palliative care: Secondary | ICD-10-CM

## 2021-01-04 MED ORDER — LORAZEPAM 0.5 MG PO TABS: 0.5000 mg | ORAL_TABLET | Freq: Four times a day (QID) | ORAL | 0 refills | Status: DC | PRN

## 2021-01-06 ENCOUNTER — Encounter: Payer: Self-pay | Admitting: Adult Health

## 2021-01-06 ENCOUNTER — Non-Acute Institutional Stay (SKILLED_NURSING_FACILITY): Payer: Medicare Other | Admitting: Adult Health

## 2021-01-06 DIAGNOSIS — G3109 Other frontotemporal dementia: Secondary | ICD-10-CM | POA: Diagnosis not present

## 2021-01-06 DIAGNOSIS — K5901 Slow transit constipation: Secondary | ICD-10-CM | POA: Diagnosis not present

## 2021-01-06 DIAGNOSIS — R634 Abnormal weight loss: Secondary | ICD-10-CM

## 2021-01-06 DIAGNOSIS — E1149 Type 2 diabetes mellitus with other diabetic neurological complication: Secondary | ICD-10-CM

## 2021-01-06 DIAGNOSIS — R1312 Dysphagia, oropharyngeal phase: Secondary | ICD-10-CM

## 2021-01-06 DIAGNOSIS — E1142 Type 2 diabetes mellitus with diabetic polyneuropathy: Secondary | ICD-10-CM

## 2021-01-06 DIAGNOSIS — F0280 Dementia in other diseases classified elsewhere without behavioral disturbance: Secondary | ICD-10-CM

## 2021-01-06 DIAGNOSIS — I25118 Atherosclerotic heart disease of native coronary artery with other forms of angina pectoris: Secondary | ICD-10-CM

## 2021-01-06 MED ORDER — OXYCODONE HCL 5 MG/5ML PO SOLN: 2.5000 mg | ORAL | 0 refills | Status: DC | PRN

## 2021-01-06 NOTE — Progress Notes (Signed)
Location:  [facility] of Service:  SNF (31) Provider:   Cindi Carbon, [place] [phone]   [name], MD  Patient Care Team: [name], MD as PCP - General (Internal Medicine)  Extended Emergency Contact Information Primary Emergency Contact: Alley,Kelly Address: [address]          [place], [place] [address] [name] of [place] Phone: [phone] Relation: Daughter Secondary Emergency Contact: Rockwood,[name] Address: [address] [place] [address] [place] of [place] Phone: [phone] Relation: Son  Code Status:  DNR Goals of care: Advanced Directive information Advanced Directives 12/10/2020  Does Patient Have a Medical Advance Directive? Yes  Type of Advance Directive [place]  Does patient want to make changes to medical advance directive? No - Patient declined  Copy of [place] in Chart? Yes - validated most recent copy scanned in chart (See row information)  Would patient like information on creating a medical advance directive? -  Pre-existing out of facility DNR order (yellow form or pink MOST form) -     Chief Complaint  Patient presents with   Medical Management of Chronic Issues    HPI:  Pt is a 80 y.o. male seen today for medical management of chronic diseases.   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 He resides in skilled care and requires assistance for all dressing, bathing, and feeding. He is incontinent of bowel and bladder.   Dysphagia: continues on D1 NTL and tolerating well. Has gained 5 lbs in the past month after a period of weight loss. No issues with coughing or choking.   Has periods of agitation that involve hitting and kicking the staff and making attempts to get out of bed. Sustained a skin tear to his lower extremity in one instance. The staff is using prn ativan with some relief. They also  have used haldol as well.   His BP runs low in the 02H systolic  He is no longer on plavix for cad due to his goals of care  Hx of diabetes which has been well controlled for years without meds  Bowels are moving well.   Past Medical History:  Diagnosis Date   Dementia ([facility])    Diabetes ([facility])    Heart disease    Hyperlipidemia    Hypotension    Past Surgical History:  Procedure Laterality Date   APPENDECTOMY  1961   CORONARY ANGIOPLASTY WITH STENT PLACEMENT  2016   TONSILLECTOMY AND ADENOIDECTOMY     TOTAL KNEE ARTHROPLASTY  2012    Allergies  Allergen Reactions   Lortab [Hydrocodone-Acetaminophen] Other (See Comments)    Reaction:  Affected pts cognition    Morphine And Related Other (See Comments)    Reaction:  Affected pts cognition     Outpatient Encounter Medications as of 01/06/2021  Medication Sig   acetaminophen (TYLENOL) 325 MG tablet Take 650 mg by mouth 3 (three) times daily.   acetaminophen (TYLENOL) 650 MG suppository Place 650 mg rectally every 4 (four) hours as needed for fever.   bisacodyl (DULCOLAX) 10 MG suppository Place 10 mg rectally daily as needed (constipation).    haloperidol (HALDOL) 0.5 MG tablet Take 0.5 mg by mouth every 6 (six) hours as needed for agitation.   haloperidol (HALDOL) 0.5 MG tablet Take 0.5 mg by mouth at bedtime.   LORazepam (  ATIVAN) 0.5 MG tablet Take 1 tablet (0.5 mg total) by mouth every 6 (six) hours as needed for anxiety.   sennosides (SENOKOT) 8.8 MG/5ML syrup Take 5 mLs by mouth 2 (two) times daily.   oxyCODONE (ROXICODONE) 5 MG/5ML solution Take 2.5 mLs (2.5 mg total) by mouth every 4 (four) hours as needed for moderate pain.   [DISCONTINUED] oxyCODONE (ROXICODONE) 5 MG/5ML solution Take 2.5-5 mLs (2.5-5 mg total) by mouth every 4 (four) hours as needed for moderate pain. (Patient taking differently: Take 2.5 mg by mouth every 4 (four) hours as needed for moderate pain.)   No facility-administered encounter medications on  file as of 01/06/2021.    Review of Systems  Unable to perform ROS: Dementia   Immunization History  Administered Date(s) Administered   Influenza, High Dose Seasonal PF 04/17/2019, 04/23/2020   Influenza,inj,Quad PF,6+ Mos 04/23/2018   Influenza-Unspecified 05/05/2010, 04/11/2013, 03/03/2015, 09/06/2016, 05/10/2017, 04/27/2020   Moderna SARS-COV2 Booster Vaccination 05/13/2020   Moderna Sars-Covid-2 Vaccination 07/08/2019, 08/05/2019   Pneumococcal Polysaccharide-23 12/25/2010   Pneumococcal-Unspecified 02/28/2013   Td 06/01/2017   Tdap 03/22/2006   Zoster Recombinat (Shingrix) 05/25/2017, 09/25/2017   Zoster, Live 11/01/2012   Pertinent  Health Maintenance Due  Topic Date Due   INFLUENZA VACCINE  01/31/2021   PNA vac Low Risk Adult  Completed   Fall Risk  07/27/2020 05/08/2019 09/21/2018 05/29/2018 05/29/2017  Falls in the past year? 1 1 Exclusion - non ambulatory 0 No  Number falls in past yr: 0 1 - 0 -  Injury with Fall? 1 1 - 0 -  Risk for fall due to : History of fall(s);Medication side effect History of fall(s);Impaired balance/gait - - -  Follow up Falls evaluation completed Falls evaluation completed - - -   Functional Status Survey:    Vitals:   01/06/21 1124  Weight: 159 lb 3.2 oz (72.2 kg)   Body mass index is 26.49 kg/m. Wt Readings from Last 3 Encounters:  01/06/21 159 lb 3.2 oz (72.2 kg)  12/10/20 154 lb 9.6 oz (70.1 kg)  10/29/20 164 lb 12.8 oz (74.8 kg)    Physical Exam Vitals and nursing note reviewed.  Constitutional:      General: He is not in acute distress.    Appearance: He is not diaphoretic.     Comments: asleep  HENT:     Head: Normocephalic and atraumatic.     Mouth/Throat:     Mouth: Mucous membranes are moist.     Pharynx: Oropharynx is clear.  Neck:     Thyroid: No thyromegaly.     Vascular: No JVD.     Trachea: No tracheal deviation.  Cardiovascular:     Rate and Rhythm: Normal rate and regular rhythm.     Heart sounds: No  murmur heard. Pulmonary:     Effort: Pulmonary effort is normal. No respiratory distress.     Breath sounds: Normal breath sounds. No wheezing.  Abdominal:     General: Bowel sounds are normal. There is no distension.     Palpations: Abdomen is soft.     Tenderness: There is no abdominal tenderness.  Musculoskeletal:     Right lower leg: No edema.     Left lower leg: No edema.  Lymphadenopathy:     Cervical: No cervical adenopathy.  Skin:    General: Skin is warm and dry.  Neurological:     General: No focal deficit present.     Mental Status: Mental status is at baseline.  Psychiatric:        Mood and Affect: Mood normal.    Labs reviewed: Recent Labs    10/21/20 0207 10/22/20 0446  NA 140 144  K 4.4 4.1  CL 108 112*  CO2 22 22  GLUCOSE 245* 133*  BUN 27* 25*  CREATININE 1.34* 1.20  CALCIUM 8.1* 8.2*  MG 1.9 1.8   Recent Labs    10/21/20 0207 10/22/20 0446  AST 19 19  ALT 12 11  ALKPHOS 120 85  BILITOT 1.1 0.6  PROT 6.5 5.6*  ALBUMIN 3.7 3.1*   Recent Labs    10/21/20 0207 10/22/20 0446  WBC 10.4 9.7  NEUTROABS 9.5*  --   HGB 12.8* 10.4*  HCT 39.8 32.1*  MCV 98.0 97.9  PLT 214 183   Lab Results  Component Value Date   TSH 2.45 01/09/2019   Lab Results  Component Value Date   HGBA1C 7.1 (H) 10/21/2020   Lab Results  Component Value Date   CHOL 124 10/26/2016   HDL 40 10/26/2016   LDLCALC 62 10/26/2016   TRIG 111 10/26/2016    Significant Diagnostic Results in last 30 days:  No results found.  Assessment/Plan  1. 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 Slow transit constipation Continue senokot bid and dulcolax prn  3. Type 2 diabetes mellitus with neurological complications (HCC) Controlled Not currently on meds A1C 7.1  4. Diabetic peripheral neuropathy associated with type 2 diabetes  mellitus (HCC) Off neurontin, no current issues   5. Oropharyngeal dysphagia Continue D1 diet Asp prec 1:1 supervision  6. Coronary artery disease of native artery of native heart with stable angina pectoris ([place]) As above  Comfort based care  7. Weight loss Appetite and weight have improved after a period of weight loss   Labs/tests ordered:  NA

## 2021-02-02 ENCOUNTER — Non-Acute Institutional Stay (SKILLED_NURSING_FACILITY): Payer: Medicare Other | Admitting: Orthopedic Surgery

## 2021-02-02 ENCOUNTER — Encounter: Payer: Self-pay | Admitting: Orthopedic Surgery

## 2021-02-02 DIAGNOSIS — B3742 Candidal balanitis: Secondary | ICD-10-CM | POA: Diagnosis not present

## 2021-02-02 NOTE — Progress Notes (Signed)
Location:  [facility] Room Number: [place] of Service:  SNF [phone]) Provider:  Windell Moulding, AGNP-C  [name], MD  Patient Care Team: [name], MD as PCP - General (Internal Medicine)  Extended Emergency Contact Information Primary Emergency Contact: Alley,Kelly Address: [address]          [place], [place] [address] [name] of [place] Phone: [phone] Relation: Daughter Secondary Emergency Contact: Hockman,[name] Address: [address] [place] [address] [place] Phone: [phone] Relation: Son  Code Status:  DNR Goals of care: Advanced Directive information Advanced Directives 12/10/2020  Does Patient Have a Medical Advance Directive? Yes  Type of Advance Directive [place]  Does patient want to make changes to medical advance directive? No - Patient declined  Copy of [place] in Chart? Yes - validated most recent copy scanned in chart (See row information)  Would patient like information on creating a medical advance directive? -  Pre-existing out of facility DNR order (yellow form or pink MOST form) -     Chief Complaint  Patient presents with   Acute Visit    Patient presents for a rash on penis    HPI:  Pt is a 80 y.o. male seen today for acute visit due to rash.   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 Nursing reports rash to head of penis. Rash first noticed 01/28/21. Nystatin cream had been applied to area bid for the past few days without success. He is incontinent of bowels and bladder and wears adult brief.   Nurse does not report any other concerns, vital stable.    Past Medical History:  Diagnosis Date   Dementia ([facility])    Diabetes ([place])    Heart disease    Hyperlipidemia    Hypotension    Past Surgical History:  Procedure Laterality Date   APPENDECTOMY  1961   CORONARY ANGIOPLASTY WITH STENT  PLACEMENT  2016   TONSILLECTOMY AND ADENOIDECTOMY     TOTAL KNEE ARTHROPLASTY  2012    Allergies  Allergen Reactions   Lortab [Hydrocodone-Acetaminophen] Other (See Comments)    Reaction:  Affected pts cognition    Morphine And Related Other (See Comments)    Reaction:  Affected pts cognition     Outpatient Encounter Medications as of 02/02/2021  Medication Sig   acetaminophen (TYLENOL) 325 MG tablet Take 650 mg by mouth 3 (three) times daily.   acetaminophen (TYLENOL) 650 MG suppository Place 650 mg rectally every 4 (four) hours as needed for fever.   bisacodyl (DULCOLAX) 10 MG suppository Place 10 mg rectally daily as needed (constipation).    haloperidol (HALDOL) 0.5 MG tablet Take 0.5 mg by mouth every 6 (six) hours as needed for agitation.   haloperidol (HALDOL) 0.5 MG tablet Take 0.5 mg by mouth at bedtime.   LORazepam (ATIVAN) 0.5 MG tablet Take 1 tablet (0.5 mg total) by mouth every 6 (six) hours as needed for anxiety.   oxyCODONE (ROXICODONE) 5 MG/5ML solution Take 2.5 mLs (2.5 mg total) by mouth every 4 (four) hours as needed for moderate pain.   sennosides (SENOKOT) 8.8 MG/5ML syrup Take 5 mLs by mouth 2 (two) times daily.   No facility-administered encounter medications on file as of 02/02/2021.    Review of Systems  Unable to perform ROS: Dementia   Immunization History  Administered  Date(s) Administered   Influenza, High Dose Seasonal PF 04/17/2019, 04/23/2020   Influenza,inj,Quad PF,6+ Mos 04/23/2018   Influenza-Unspecified 05/05/2010, 04/11/2013, 03/03/2015, 09/06/2016, 05/10/2017, 04/27/2020   Moderna SARS-COV2 Booster Vaccination 05/13/2020   Moderna Sars-Covid-2 Vaccination 07/08/2019, 08/05/2019   Pneumococcal Polysaccharide-23 12/25/2010   Pneumococcal-Unspecified 02/28/2013   Td 06/01/2017   Tdap 03/22/2006   Zoster Recombinat (Shingrix) 05/25/2017, 09/25/2017   Zoster, Live 11/01/2012   Pertinent  Health Maintenance Due  Topic Date Due   INFLUENZA  VACCINE  01/31/2021   PNA vac Low Risk Adult  Completed   Fall Risk  07/27/2020 05/08/2019 09/21/2018 05/29/2018 05/29/2017  Falls in the past year? 1 1 Exclusion - non ambulatory 0 No  Number falls in past yr: 0 1 - 0 -  Injury with Fall? 1 1 - 0 -  Risk for fall due to : History of fall(s);Medication side effect History of fall(s);Impaired balance/gait - - -  Follow up Falls evaluation completed Falls evaluation completed - - -   Functional Status Survey:    Vitals:   02/02/21 1512  BP: (!) 81/50  Pulse: (!) 56  Resp: 16  Temp: (!) 97 F (36.1 C)  SpO2: 99%  Weight: 159 lb 12.8 oz (72.5 kg)   Body mass index is 26.59 kg/m. Physical Exam Vitals reviewed. Exam conducted with a chaperone present.  Constitutional:      General: He is not in acute distress. HENT:     Head: Normocephalic.  Cardiovascular:     Rate and Rhythm: Normal rate and regular rhythm.     Pulses: Normal pulses.     Heart sounds: Normal heart sounds. No murmur heard. Pulmonary:     Effort: Pulmonary effort is normal. No respiratory distress.     Breath sounds: Normal breath sounds. No wheezing.  Genitourinary:    Penis: Circumcised. Erythema present. No tenderness, discharge, swelling or lesions.      Testes: Normal.  Neurological:     Mental Status: He is alert.    Labs reviewed: Recent Labs    10/21/20 0207 10/22/20 0446  NA 140 144  K 4.4 4.1  CL 108 112*  CO2 22 22  GLUCOSE 245* 133*  BUN 27* 25*  CREATININE 1.34* 1.20  CALCIUM 8.1* 8.2*  MG 1.9 1.8   Recent Labs    10/21/20 0207 10/22/20 0446  AST 19 19  ALT 12 11  ALKPHOS 120 85  BILITOT 1.1 0.6  PROT 6.5 5.6*  ALBUMIN 3.7 3.1*   Recent Labs    10/21/20 0207 10/22/20 0446  WBC 10.4 9.7  NEUTROABS 9.5*  --   HGB 12.8* 10.4*  HCT 39.8 32.1*  MCV 98.0 97.9  PLT 214 183   Lab Results  Component Value Date   TSH 2.45 01/09/2019   Lab Results  Component Value Date   HGBA1C 7.1 (H) 10/21/2020   Lab Results   Component Value Date   CHOL 124 10/26/2016   HDL 40 10/26/2016   LDLCALC 62 10/26/2016   TRIG 111 10/26/2016    Significant Diagnostic Results in last 30 days:  No results found.  Assessment/Plan 1. Candidal balanitis - suspect due to moisture from wearing adult brief  - erythema on glans of penis, no open lesions or swelling - start clotrimazole 1% cream- apply to glans of penis bid x 2 weeks - continue to keep peri-area dry and clean    Family/ staff Communication: plan discussed with nurse  Labs/tests ordered:  none

## 2021-02-07 ENCOUNTER — Non-Acute Institutional Stay (SKILLED_NURSING_FACILITY): Admitting: Internal Medicine

## 2021-02-07 ENCOUNTER — Encounter: Payer: Self-pay | Admitting: Internal Medicine

## 2021-02-07 DIAGNOSIS — G3109 Other frontotemporal dementia: Secondary | ICD-10-CM

## 2021-02-07 DIAGNOSIS — F0280 Dementia in other diseases classified elsewhere without behavioral disturbance: Secondary | ICD-10-CM | POA: Diagnosis not present

## 2021-02-07 DIAGNOSIS — R1312 Dysphagia, oropharyngeal phase: Secondary | ICD-10-CM | POA: Diagnosis not present

## 2021-02-07 NOTE — Progress Notes (Signed)
Location:  [facility] Room Number: 114 Place of Service:  SNF [phone])  Provider: Veleta Miners MD  Code Status: DNR Hospice Goals of Care:  Advanced Directives 02/07/2021  Does Patient Have a Medical Advance Directive? Yes  Type of Advance Directive [facility]  Does patient want to make changes to medical advance directive? No - Patient declined  Copy of [place] in Chart? Yes - validated most recent copy scanned in chart (See row information)  Would patient like information on creating a medical advance directive? -  Pre-existing out of facility DNR order (yellow form or pink MOST form) -     Chief Complaint  Patient presents with   Medical Management of Chronic Issues   Health Maintenance    #4 covid vaccine, flu    HPI: Patient is a 80 y.o. male seen today for medical management of chronic diseases.   Patient with End Stage Dementia, Dysphagia, Recurent UTI, CKD CAD s/p Stent Placement  Admitted in hospital from 4/21-4/25 for Aspiration Pneumonia and Respiratory Failure with UTI Family decided to make him Hospice  Patient is stable per Nurses. Does not respond. 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 Stable on Haldol and Ativan PRN Weight is stable Wheelchair bound  Past Medical History:  Diagnosis Date   Dementia ([place])    Diabetes ([place])    Heart disease    Hyperlipidemia    Hypotension     Past Surgical History:  Procedure Laterality Date   APPENDECTOMY  1961   CORONARY ANGIOPLASTY WITH STENT PLACEMENT  2016   TONSILLECTOMY AND ADENOIDECTOMY     TOTAL KNEE ARTHROPLASTY  2012    Allergies  Allergen Reactions   Lortab [Hydrocodone-Acetaminophen] Other (See Comments)    Reaction:  Affected pts cognition    Morphine And Related Other (See Comments)    Reaction:  Affected pts cognition     Outpatient Encounter Medications as of 02/07/2021  Medication Sig   acetaminophen  (TYLENOL) 325 MG tablet Take 650 mg by mouth 3 (three) times daily.   acetaminophen (TYLENOL) 650 MG suppository Place 650 mg rectally every 4 (four) hours as needed for fever.   bisacodyl (DULCOLAX) 10 MG suppository Place 10 mg rectally daily as needed (constipation).    clotrimazole (LOTRIMIN) 1 % cream Apply 1 application topically 2 (two) times daily.   haloperidol (HALDOL) 0.5 MG tablet Take 0.5 mg by mouth every 6 (six) hours as needed for agitation.   LORazepam (ATIVAN) 0.5 MG tablet Take 1 tablet (0.5 mg total) by mouth every 6 (six) hours as needed for anxiety.   oxyCODONE (ROXICODONE) 5 MG/5ML solution Take 2.5 mLs (2.5 mg total) by mouth every 4 (four) hours as needed for moderate pain.   sennosides (SENOKOT) 8.8 MG/5ML syrup Take 5 mLs by mouth 2 (two) times daily.   [DISCONTINUED] haloperidol (HALDOL) 0.5 MG tablet Take 0.5 mg by mouth at bedtime.   No facility-administered encounter medications on file as of 02/07/2021.    Review of Systems:  Review of Systems  Unable to perform ROS: Dementia   Health Maintenance  Topic Date Due   COVID-19 Vaccine (4 - Booster for Moderna series) 08/13/2020   INFLUENZA VACCINE  01/31/2021   TETANUS/TDAP  06/02/2027   PNA vac Low Risk Adult  Completed   Zoster Vaccines- Shingrix  Completed   HPV VACCINES  Aged Out    Physical Exam: Vitals:   02/07/21 1419  BP: (!) 81/50  Pulse: (!) 56  Resp: 16  Temp: (!) 97 F (36.1 C)  SpO2: 99%  Weight: 159 lb 12.8 oz (72.5 kg)  Height: '5\' 5"'$  (1.651 m)   Body mass index is 26.59 kg/m. Physical Exam Vitals reviewed.  Constitutional:      Appearance: Normal appearance.  HENT:     Head: Normocephalic.     Nose: Nose normal.  Eyes:     Pupils: Pupils are equal, round, and reactive to light.  Cardiovascular:     Rate and Rhythm: Normal rate.     Pulses: Normal pulses.  Pulmonary:     Effort: Pulmonary effort is normal.     Breath sounds: Normal breath sounds.  Abdominal:     General:  Abdomen is flat. Bowel sounds are normal.     Palpations: Abdomen is soft.  Musculoskeletal:        General: No swelling.     Cervical back: Neck supple.  Skin:    General: Skin is warm.  Neurological:     Mental Status: He is alert.     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        Thought Content: Thought content normal.    Labs reviewed: Basic Metabolic Panel: Recent Labs    10/21/20 0207 10/22/20 0446  NA 140 144  K 4.4 4.1  CL 108 112*  CO2 22 22  GLUCOSE 245* 133*  BUN 27* 25*  CREATININE 1.34* 1.20  CALCIUM 8.1* 8.2*  MG 1.9 1.8   Liver Function Tests: Recent Labs    10/21/20 0207 10/22/20 0446  AST 19 19  ALT 12 11  ALKPHOS 120 85  BILITOT 1.1 0.6  PROT 6.5 5.6*  ALBUMIN 3.7 3.1*   No results for input(s): LIPASE, AMYLASE in the last 8760 hours. No results for input(s): AMMONIA in the last 8760 hours. CBC: Recent Labs    10/21/20 0207 10/22/20 0446  WBC 10.4 9.7  NEUTROABS 9.5*  --   HGB 12.8* 10.4*  HCT 39.8 32.1*  MCV 98.0 97.9  PLT 214 183   Lipid Panel: No results for input(s): CHOL, HDL, LDLCALC, TRIG, CHOLHDL, LDLDIRECT in the last 8760 hours. Lab Results  Component Value Date   HGBA1C 7.1 (H) 10/21/2020    Procedures since last visit: No results found.  Assessment/Plan  Patient with End stage Dementia, Dysphagia, CKD and Failure to thrive On No Meds except for Comfort Enrolled in Hospice No more work up   Labs/tests ordered:  * No order type specified * Next appt:  Visit date not found

## 2021-02-22 ENCOUNTER — Non-Acute Institutional Stay (SKILLED_NURSING_FACILITY): Payer: PPO | Admitting: Orthopedic Surgery

## 2021-02-22 ENCOUNTER — Encounter: Payer: Self-pay | Admitting: Orthopedic Surgery

## 2021-02-22 DIAGNOSIS — B3742 Candidal balanitis: Secondary | ICD-10-CM

## 2021-02-22 DIAGNOSIS — Z66 Do not resuscitate: Secondary | ICD-10-CM

## 2021-02-22 MED ORDER — CLOTRIMAZOLE 1 % EX CREA: 1.0000 "application " | TOPICAL_CREAM | Freq: Two times a day (BID) | CUTANEOUS | 0 refills | Status: DC | PRN

## 2021-02-22 NOTE — Progress Notes (Signed)
Location:  [facility] Room Number: [place] of Service:  SNF [phone]) Provider:  Windell Moulding, AGNP-C  [name], MD  Patient Care Team: [name], MD as PCP - General (Internal Medicine)  Extended Emergency Contact Information Primary Emergency Contact: Alley,Kelly Address: [address]          [place], [place] [address] [name] of [place] Phone: [phone] Relation: Daughter Secondary Emergency Contact: Forinash,[name] Address: [address] [place] [place] [address] [place] of [place] Phone: [phone] Relation: Son  Code Status:  DNR Goals of care: Advanced Directive information Advanced Directives 02/07/2021  Does Patient Have a Medical Advance Directive? Yes  Type of Advance Directive [place]  Does patient want to make changes to medical advance directive? No - Patient declined  Copy of [place] in Chart? Yes - validated most recent copy scanned in chart (See row information)  Would patient like information on creating a medical advance directive? -  Pre-existing out of facility DNR order (yellow form or pink MOST form) -     Chief Complaint  Patient presents with   Acute Visit    Redness to penis    HPI:  Pt is a 80 y.o. male seen today for acute visit due to penile redness.   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  Nurse reports redness to glans penis. 08/03 he was diagnosed with candidal balanitis and given clotrimazole 1% cream. He has been off cream for 2 days and rash has returned.   Nurse does not report any other concerns, vitals stable.    Past Medical History:  Diagnosis Date   Dementia ([place])    Diabetes ([place])    Heart disease    Hyperlipidemia    Hypotension    Past Surgical History:  Procedure Laterality Date   APPENDECTOMY  1961   CORONARY ANGIOPLASTY WITH STENT PLACEMENT  2016   TONSILLECTOMY AND  ADENOIDECTOMY     TOTAL KNEE ARTHROPLASTY  2012    Allergies  Allergen Reactions   Lortab [Hydrocodone-Acetaminophen] Other (See Comments)    Reaction:  Affected pts cognition    Morphine And Related Other (See Comments)    Reaction:  Affected pts cognition     Outpatient Encounter Medications as of 02/22/2021  Medication Sig   acetaminophen (TYLENOL) 325 MG tablet Take 650 mg by mouth 3 (three) times daily.   acetaminophen (TYLENOL) 650 MG suppository Place 650 mg rectally every 4 (four) hours as needed for fever.   bisacodyl (DULCOLAX) 10 MG suppository Place 10 mg rectally daily as needed (constipation).    haloperidol (HALDOL) 0.5 MG tablet Take 0.5 mg by mouth every 6 (six) hours as needed for agitation.   LORazepam (ATIVAN) 0.5 MG tablet Take 1 tablet (0.5 mg total) by mouth every 6 (six) hours as needed for anxiety.   oxyCODONE (ROXICODONE) 5 MG/5ML solution Take 2.5 mLs (2.5 mg total) by mouth every 4 (four) hours as needed for moderate pain.   sennosides (SENOKOT) 8.8 MG/5ML syrup Take 5 mLs by mouth 2 (two) times daily.   No facility-administered encounter medications on file as of 02/22/2021.    Review of Systems  Unable to perform ROS: Dementia   Immunization History  Administered Date(s) Administered   Influenza, High Dose Seasonal PF 04/17/2019, 04/23/2020   Influenza,inj,Quad PF,6+ Mos 04/23/2018   Influenza-Unspecified 05/05/2010, 04/11/2013, 03/03/2015,  09/06/2016, 05/10/2017, 04/27/2020   Moderna SARS-COV2 Booster Vaccination 05/13/2020   Moderna Sars-Covid-2 Vaccination 07/08/2019, 08/05/2019   Pneumococcal Polysaccharide-23 12/25/2010   Pneumococcal-Unspecified 02/28/2013   Td 06/01/2017   Tdap 03/22/2006   Zoster Recombinat (Shingrix) 05/25/2017, 09/25/2017   Zoster, Live 11/01/2012   Pertinent  Health Maintenance Due  Topic Date Due   INFLUENZA VACCINE  01/31/2021   PNA vac Low Risk Adult  Completed   Fall Risk  07/27/2020 05/08/2019 09/21/2018  05/29/2018 05/29/2017  Falls in the past year? 1 1 Exclusion - non ambulatory 0 No  Number falls in past yr: 0 1 - 0 -  Injury with Fall? 1 1 - 0 -  Risk for fall due to : History of fall(s);Medication side effect History of fall(s);Impaired balance/gait - - -  Follow up Falls evaluation completed Falls evaluation completed - - -   Functional Status Survey:    Vitals:   02/22/21 1445  BP: 119/71  Pulse: (!) 54  Resp: 14  Temp: (!) 97.2 F (36.2 C)  SpO2: 92%  Weight: 159 lb 12.8 oz (72.5 kg)   Body mass index is 26.59 kg/m. Physical Exam Vitals reviewed. Exam conducted with a chaperone present.  Constitutional:      General: He is not in acute distress.    Appearance: He is ill-appearing.  HENT:     Head: Normocephalic.  Cardiovascular:     Rate and Rhythm: Normal rate and regular rhythm.     Pulses: Normal pulses.     Heart sounds: Normal heart sounds. No murmur heard. Genitourinary:    Penis: Erythema present. No tenderness, discharge, swelling or lesions.   Skin:    General: Skin is warm and dry.  Neurological:     General: No focal deficit present.     Mental Status: He is alert. Mental status is at baseline.  Psychiatric:        Mood and Affect: Mood normal.        Speech: He is noncommunicative.        Behavior: Behavior normal.        Cognition and Memory: Memory is impaired.    Labs reviewed: Recent Labs    10/21/20 0207 10/22/20 0446  NA 140 144  K 4.4 4.1  CL 108 112*  CO2 22 22  GLUCOSE 245* 133*  BUN 27* 25*  CREATININE 1.34* 1.20  CALCIUM 8.1* 8.2*  MG 1.9 1.8   Recent Labs    10/21/20 0207 10/22/20 0446  AST 19 19  ALT 12 11  ALKPHOS 120 85  BILITOT 1.1 0.6  PROT 6.5 5.6*  ALBUMIN 3.7 3.1*   Recent Labs    10/21/20 0207 10/22/20 0446  WBC 10.4 9.7  NEUTROABS 9.5*  --   HGB 12.8* 10.4*  HCT 39.8 32.1*  MCV 98.0 97.9  PLT 214 183   Lab Results  Component Value Date   TSH 2.45 01/09/2019   Lab Results  Component Value  Date   HGBA1C 7.1 (H) 10/21/2020   Lab Results  Component Value Date   CHOL 124 10/26/2016   HDL 40 10/26/2016   LDLCALC 62 10/26/2016   TRIG 111 10/26/2016    Significant Diagnostic Results in last 30 days:  No results found.  Assessment/Plan 1. DNR (do not resuscitate)  2. Candidal balanitis - incontinence and poor health contributing factors - just completed 2 weeks of clotrimazole- stopped 2 days and redness returned - erythema noted on glans  - diflucan 150 mg  po once - clotrimazole 1%- apply to glans of penis bid prn     Family/ staff Communication: plan discussed with patient and nurse  Labs/tests ordered:  none

## 2021-03-04 ENCOUNTER — Encounter: Payer: Self-pay | Admitting: Adult Health

## 2021-03-04 ENCOUNTER — Non-Acute Institutional Stay (SKILLED_NURSING_FACILITY): Payer: Medicare Other | Admitting: Adult Health

## 2021-03-04 DIAGNOSIS — B3742 Candidal balanitis: Secondary | ICD-10-CM

## 2021-03-04 MED ORDER — CLOBETASOL PROPIONATE 0.05 % EX OINT: 1.0000 "application " | TOPICAL_OINTMENT | Freq: Two times a day (BID) | CUTANEOUS | 0 refills | Status: DC

## 2021-03-04 NOTE — Progress Notes (Signed)
Location:  [facility] of Service:  SNF (31) Provider:   Cindi Carbon, [place] [phone]   [name], MD  Patient Care Team: [name], MD as PCP - General (Internal Medicine)  Extended Emergency Contact Information Primary Emergency Contact: Alley,Kelly Address: [address]          [place], [place] [address] [name] of [place] Phone: [phone] Relation: Daughter Secondary Emergency Contact: Cosper,[name] Address: [address] [place] [address] [place] of [place] Phone: [phone] Relation: Son  Code Status:  DNR Goals of care: Advanced Directive information Advanced Directives 02/07/2021  Does Patient Have a Medical Advance Directive? Yes  Type of Advance Directive [place]  Does patient want to make changes to medical advance directive? No - Patient declined  Copy of [place] in Chart? Yes - validated most recent copy scanned in chart (See row information)  Would patient like information on creating a medical advance directive? -  Pre-existing out of facility DNR order (yellow form or pink MOST form) -     Chief Complaint  Patient presents with   Acute Visit    rash    HPI:  Pt is a 80 y.o. male seen today for an acute visit for a penile rash. He is followed by hospice for frontal dementia. He was diagnosed with candida balanitis one month ago and tried clotrimazole which helped. Now the nurse is stating it has been restarted but is not better. The pt is not able to provide a hx. There is no swelling or penile drainage. No fever. He is incontinent with hx of DM.    Past Medical History:  Diagnosis Date   Dementia ([place])    Diabetes ([place])    Heart disease    Hyperlipidemia    Hypotension    Past Surgical History:  Procedure Laterality Date   APPENDECTOMY  1961   CORONARY ANGIOPLASTY WITH STENT PLACEMENT  2016    TONSILLECTOMY AND ADENOIDECTOMY     TOTAL KNEE ARTHROPLASTY  2012    Allergies  Allergen Reactions   Lortab [Hydrocodone-Acetaminophen] Other (See Comments)    Reaction:  Affected pts cognition    Morphine And Related Other (See Comments)    Reaction:  Affected pts cognition     Outpatient Encounter Medications as of 03/04/2021  Medication Sig   clobetasol ointment (TEMOVATE) [id number] % Apply 1 application topically 2 (two) times daily for 10 days.   acetaminophen (TYLENOL) 325 MG tablet Take 650 mg by mouth 3 (three) times daily.   acetaminophen (TYLENOL) 650 MG suppository Place 650 mg rectally every 4 (four) hours as needed for fever.   bisacodyl (DULCOLAX) 10 MG suppository Place 10 mg rectally daily as needed (constipation).    clotrimazole (LOTRIMIN AF) 1 % cream Apply 1 application topically 2 (two) times daily as needed.   haloperidol (HALDOL) 0.5 MG tablet Take 0.5 mg by mouth every 6 (six) hours as needed for agitation.   LORazepam (ATIVAN) 0.5 MG tablet Take 1 tablet (0.5 mg total) by mouth every 6 (six) hours as needed for anxiety.   oxyCODONE (ROXICODONE) 5 MG/5ML solution Take 2.5 mLs (2.5 mg total) by mouth every 4 (four) hours as needed for moderate pain.   sennosides (SENOKOT) 8.8 MG/5ML syrup Take 5 mLs by mouth 2 (two) times daily.   No facility-administered encounter medications on  file as of 03/04/2021.    Review of Systems  Unable to perform ROS: Dementia   Immunization History  Administered Date(s) Administered   Influenza, High Dose Seasonal PF 04/17/2019, 04/23/2020   Influenza,inj,Quad PF,6+ Mos 04/23/2018   Influenza-Unspecified 05/05/2010, 04/11/2013, 03/03/2015, 09/06/2016, 05/10/2017, 04/27/2020   Moderna SARS-COV2 Booster Vaccination 05/13/2020   Moderna Sars-Covid-2 Vaccination 07/08/2019, 08/05/2019   Pneumococcal Polysaccharide-23 12/25/2010   Pneumococcal-Unspecified 02/28/2013   Td 06/01/2017   Tdap 03/22/2006   Zoster Recombinat (Shingrix)  05/25/2017, 09/25/2017   Zoster, Live 11/01/2012   Pertinent  Health Maintenance Due  Topic Date Due   INFLUENZA VACCINE  01/31/2021   PNA vac Low Risk Adult  Completed   Fall Risk  07/27/2020 05/08/2019 09/21/2018 05/29/2018 05/29/2017  Falls in the past year? 1 1 Exclusion - non ambulatory 0 No  Number falls in past yr: 0 1 - 0 -  Injury with Fall? 1 1 - 0 -  Risk for fall due to : History of fall(s);Medication side effect History of fall(s);Impaired balance/gait - - -  Follow up Falls evaluation completed Falls evaluation completed - - -   Functional Status Survey:    There were no vitals filed for this visit. There is no height or weight on file to calculate BMI. Physical Exam Vitals and nursing note reviewed.  Constitutional:      General: He is not in acute distress.    Appearance: He is not diaphoretic.  HENT:     Head: Normocephalic and atraumatic.  Neck:     Thyroid: No thyromegaly.     Vascular: No JVD.     Trachea: No tracheal deviation.  Cardiovascular:     Rate and Rhythm: Normal rate and regular rhythm.     Heart sounds: No murmur heard. Pulmonary:     Effort: Pulmonary effort is normal. No respiratory distress.     Breath sounds: Normal breath sounds. No wheezing.  Abdominal:     General: Bowel sounds are normal. There is no distension.     Palpations: Abdomen is soft.     Tenderness: There is no abdominal tenderness.  Lymphadenopathy:     Cervical: No cervical adenopathy.  Skin:    General: Skin is warm and dry.     Findings: Rash (macular rash to penis head. groing and scrotum were wnl and no swelling or drainage.) present.  Neurological:     Mental Status: He is alert.     Comments: Alert but not oriented or verbal. Attempted to grab my arms during his exam    Labs reviewed: Recent Labs    10/21/20 0207 10/22/20 0446  NA 140 144  K 4.4 4.1  CL 108 112*  CO2 22 22  GLUCOSE 245* 133*  BUN 27* 25*  CREATININE 1.34* 1.20  CALCIUM 8.1* 8.2*   MG 1.9 1.8   Recent Labs    10/21/20 0207 10/22/20 0446  AST 19 19  ALT 12 11  ALKPHOS 120 85  BILITOT 1.1 0.6  PROT 6.5 5.6*  ALBUMIN 3.7 3.1*   Recent Labs    10/21/20 0207 10/22/20 0446  WBC 10.4 9.7  NEUTROABS 9.5*  --   HGB 12.8* 10.4*  HCT 39.8 32.1*  MCV 98.0 97.9  PLT 214 183   Lab Results  Component Value Date   TSH 2.45 01/09/2019   Lab Results  Component Value Date   HGBA1C 7.1 (H) 10/21/2020   Lab Results  Component Value Date   CHOL 124 10/26/2016  HDL 40 10/26/2016   LDLCALC 62 10/26/2016   TRIG 111 10/26/2016    Significant Diagnostic Results in last 30 days:  No results found.  Assessment/Plan 1. Candidal balanitis Apply clobetasol with clotrimazole bid for 10 days Try to keep scrotum/penis elevated and dry  - clobetasol ointment (TEMOVATE) 0.05 %; Apply 1 application topically 2 (two) times daily for 10 days.  Dispense: 30 g; Refill: 0    Family/ staff Communication: nurse  Labs/tests ordered:  NA

## 2021-03-04 NOTE — Progress Notes (Signed)
Location:   [facility] of Service:   SNF Provider:  Royal Hawthorn, NP  Jose Dad, MD  Patient Care Team: Jose Dad, MD as PCP - General (Internal Medicine)  Extended Emergency Contact Information Primary Emergency Contact: Castillo,Jose Address: [address]          [place], [place] [address] Jose Castillo of [place] Phone: [phone] Relation: Daughter Secondary Emergency Contact: Castillo,Jose Address: [address] [place] [address] [place] of [place] Phone: [phone] Relation: Son  Code Status:  DNR Hospice Goals of care: Advanced Directive information Advanced Directives 03/04/2021  Does Patient Have a Medical Advance Directive? Yes  Type of Advance Directive [place]  Does patient want to make changes to medical advance directive? No - Patient declined  Copy of [place] in Chart? Yes - validated most recent copy scanned in chart (See row information)  Would patient like information on creating a medical advance directive? -  Pre-existing out of facility DNR order (yellow form or pink MOST form) -     Chief Complaint  Patient presents with   Acute Visit    Rash     HPI:  Pt is a 80 y.o. male seen today for an acute visit for    Past Medical History:  Diagnosis Date   Dementia ([place])    Diabetes ([place])    Heart disease    Hyperlipidemia    Hypotension    Past Surgical History:  Procedure Laterality Date   APPENDECTOMY  1961   CORONARY ANGIOPLASTY WITH STENT PLACEMENT  2016   TONSILLECTOMY AND ADENOIDECTOMY     TOTAL KNEE ARTHROPLASTY  2012    Allergies  Allergen Reactions   Lortab [Hydrocodone-Acetaminophen] Other (See Comments)    Reaction:  Affected pts cognition    Morphine And Related Other (See Comments)    Reaction:  Affected pts cognition     Allergies as of 03/04/2021       Reactions   Lortab [hydrocodone-acetaminophen] Other  (See Comments)   Reaction:  Affected pts cognition    Morphine And Related Other (See Comments)   Reaction:  Affected pts cognition         Medication List        Accurate as of March 04, 2021 11:04 AM. If you have any questions, ask your nurse or doctor.          acetaminophen 650 MG suppository Commonly known as: TYLENOL Place 650 mg rectally every 4 (four) hours as needed for fever.   acetaminophen 325 MG tablet Commonly known as: TYLENOL Take 650 mg by mouth 3 (three) times daily.   bisacodyl 10 MG suppository Commonly known as: DULCOLAX Place 10 mg rectally daily as needed (constipation).   clotrimazole 1 % cream Commonly known as: Lotrimin AF Apply 1 application topically 2 (two) times daily as needed.   haloperidol 0.5 MG tablet Commonly known as: HALDOL Take 0.5 mg by mouth every 6 (six) hours as needed for agitation.   LORazepam 0.5 MG tablet Commonly known as: Ativan Take 1 tablet (0.5 mg total) by mouth every 6 (six) hours as needed for anxiety.   oxyCODONE 5 MG/5ML solution Commonly known as: ROXICODONE Take 2.5 mLs (2.5 mg total) by mouth every 4 (four) hours as needed for moderate pain.   sennosides 8.8 MG/5ML syrup Commonly known as: SENOKOT Take 5 mLs by  mouth 2 (two) times daily.        Review of Systems  Immunization History  Administered Date(s) Administered   Influenza, High Dose Seasonal PF 04/17/2019, 04/23/2020   Influenza,inj,Quad PF,6+ Mos 04/23/2018   Influenza-Unspecified 05/05/2010, 04/11/2013, 03/03/2015, 09/06/2016, 05/10/2017, 04/27/2020   Moderna SARS-COV2 Booster Vaccination 05/13/2020   Moderna Sars-Covid-2 Vaccination 07/08/2019, 08/05/2019   Pneumococcal Polysaccharide-23 12/25/2010   Pneumococcal-Unspecified 02/28/2013   Td 06/01/2017   Tdap 03/22/2006   Zoster Recombinat (Shingrix) 05/25/2017, 09/25/2017   Zoster, Live 11/01/2012   Pertinent  Health Maintenance Due  Topic Date Due   INFLUENZA VACCINE   01/31/2021   PNA vac Low Risk Adult  Completed   Fall Risk  07/27/2020 05/08/2019 09/21/2018 05/29/2018 05/29/2017  Falls in the past year? 1 1 Exclusion - non ambulatory 0 No  Number falls in past yr: 0 1 - 0 -  Injury with Fall? 1 1 - 0 -  Risk for fall due to : History of fall(s);Medication side effect History of fall(s);Impaired balance/gait - - -  Follow up Falls evaluation completed Falls evaluation completed - - -   Functional Status Survey:    Vitals:   03/04/21 1058  BP: 100/61  Pulse: (!) 56  Resp: 16  Temp: (!) 97.2 F (36.2 C)  SpO2: 93%  Weight: 159 lb 12.8 oz (72.5 kg)  Height: '5\' 5"'$  (1.651 m)   Body mass index is 26.59 kg/m. Physical Exam  Labs reviewed: Recent Labs    10/21/20 0207 10/22/20 0446  NA 140 144  K 4.4 4.1  CL 108 112*  CO2 22 22  GLUCOSE 245* 133*  BUN 27* 25*  CREATININE 1.34* 1.20  CALCIUM 8.1* 8.2*  MG 1.9 1.8   Recent Labs    10/21/20 0207 10/22/20 0446  AST 19 19  ALT 12 11  ALKPHOS 120 85  BILITOT 1.1 0.6  PROT 6.5 5.6*  ALBUMIN 3.7 3.1*   Recent Labs    10/21/20 0207 10/22/20 0446  WBC 10.4 9.7  NEUTROABS 9.5*  --   HGB 12.8* 10.4*  HCT 39.8 32.1*  MCV 98.0 97.9  PLT 214 183   Lab Results  Component Value Date   TSH 2.45 01/09/2019   Lab Results  Component Value Date   HGBA1C 7.1 (H) 10/21/2020   Lab Results  Component Value Date   CHOL 124 10/26/2016   HDL 40 10/26/2016   LDLCALC 62 10/26/2016   TRIG 111 10/26/2016    Significant Diagnostic Results in last 30 days:  No results found.  Assessment/Plan There are no diagnoses linked to this encounter.   Family/ staff Communication:   Labs/tests ordered:

## 2021-03-11 NOTE — Progress Notes (Signed)
This encounter was created in error - please disregard.

## 2021-03-15 ENCOUNTER — Other Ambulatory Visit: Payer: Self-pay | Admitting: Orthopedic Surgery

## 2021-03-15 DIAGNOSIS — F419 Anxiety disorder, unspecified: Secondary | ICD-10-CM

## 2021-03-15 DIAGNOSIS — Z515 Encounter for palliative care: Secondary | ICD-10-CM

## 2021-03-15 MED ORDER — LORAZEPAM 0.5 MG PO TABS: 0.5000 mg | ORAL_TABLET | Freq: Four times a day (QID) | ORAL | 0 refills | Status: DC | PRN

## 2021-03-18 ENCOUNTER — Non-Acute Institutional Stay (SKILLED_NURSING_FACILITY): Payer: Medicare Other | Admitting: Adult Health

## 2021-03-18 ENCOUNTER — Encounter: Payer: Self-pay | Admitting: Adult Health

## 2021-03-18 DIAGNOSIS — R1312 Dysphagia, oropharyngeal phase: Secondary | ICD-10-CM

## 2021-03-18 DIAGNOSIS — E1149 Type 2 diabetes mellitus with other diabetic neurological complication: Secondary | ICD-10-CM

## 2021-03-18 DIAGNOSIS — I25118 Atherosclerotic heart disease of native coronary artery with other forms of angina pectoris: Secondary | ICD-10-CM | POA: Diagnosis not present

## 2021-03-18 DIAGNOSIS — F0280 Dementia in other diseases classified elsewhere without behavioral disturbance: Secondary | ICD-10-CM | POA: Diagnosis not present

## 2021-03-18 DIAGNOSIS — N1832 Chronic kidney disease, stage 3b: Secondary | ICD-10-CM

## 2021-03-18 DIAGNOSIS — E1142 Type 2 diabetes mellitus with diabetic polyneuropathy: Secondary | ICD-10-CM | POA: Diagnosis not present

## 2021-03-18 DIAGNOSIS — G3109 Other frontotemporal dementia: Secondary | ICD-10-CM | POA: Diagnosis not present

## 2021-03-18 DIAGNOSIS — M170 Bilateral primary osteoarthritis of knee: Secondary | ICD-10-CM

## 2021-03-18 NOTE — Progress Notes (Signed)
Location:   [facility] Room Number: 114 Place of Service:  SNF [phone]) Provider:  Royal Hawthorn, NP  [name], MD  Patient Care Team: [name], MD as PCP - General (Internal Medicine)  Extended Emergency Contact Information Primary Emergency Contact: Alley,Kelly Address: [address]          [place], [place] [address] [name] of [place] Phone: [phone] Relation: Daughter Secondary Emergency Contact: Ige,[name] Address: [address] [place] [address] [place] Phone: [phone] Relation: Son  Code Status:  DNR Hospice Goals of care: Advanced Directive information Advanced Directives 03/18/2021  Does Patient Have a Medical Advance Directive? Yes  Type of Advance Directive [place]  Does patient want to make changes to medical advance directive? No - Patient declined  Copy of [place] in Chart? Yes - validated most recent copy scanned in chart (See row information)  Would patient like information on creating a medical advance directive? -  Pre-existing out of facility DNR order (yellow form or pink MOST form) -     Chief Complaint  Patient presents with  . Medical Management of Chronic Issues  . Quality Metric Gaps    #4 covid and Flu shot    HPI:  Pt is a 80 y.o. male seen today for medical management of chronic diseases.   PMH significant for dementia, DM, neuropathy, dysphagia, HLD, low bp, gout, constipation, CAD with stent, BPH, CKD, and HLD He is followed by hospice for frontal dementia and dysphagia.   BP 106-113/64  Down 6 lbs in review of weights   No reports of coughing or choking  Wt Readings from Last 3 Encounters:  03/18/21 154 lb (69.9 kg)  03/04/21 159 lb 12.8 oz (72.5 kg)  02/22/21 159 lb 12.8 oz (72.5 kg)   Continues with periods of agitation. The staff has used ativan 4 x in the past two weeks and oxycodone  one time. He can become stiff and resist care when dressing or bathing. Also hits and kicks.   Past Medical History:  Diagnosis Date  . Benign hypertensive heart disease without heart failure 04/10/2016  . BPH without obstruction/lower urinary tract symptoms 04/05/2016  . CKD (chronic kidney disease) stage 3, GFR 30-59 ml/min (HCC) 01/08/2019  . Constipation 06/22/2017  . Coronary artery disease of native artery of native heart with stable angina pectoris ([place]) 04/10/2016  . Dementia ([place])   . Diabetes ([place])   . Dysphagia 06/12/2019  . Essential tremor 09/04/2016  . 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 Gait abnormality 11/13/2016  . Heart disease   . Hyperlipidemia   . Hyperlipidemia associated with type 2 diabetes mellitus ([place]) 04/05/2016  . Hyperuricemia 10/31/2016  . Hypotension   . Partial retinal detachment of left eye 04/10/2016  . 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 Type 2 diabetes mellitus with neurological complications ([place]) 0000000   Past Surgical History:  Procedure Laterality Date  . APPENDECTOMY  1961  . CORONARY ANGIOPLASTY WITH STENT PLACEMENT  2016  . TONSILLECTOMY AND ADENOIDECTOMY    . TOTAL KNEE ARTHROPLASTY  2012    Allergies  Allergen Reactions  . Lortab [Hydrocodone-Acetaminophen] Other (See Comments)    Reaction:  Affected pts cognition   . Morphine And Related Other (See Comments)    Reaction:  Affected pts cognition     Allergies as of  03/18/2021       Reactions   Lortab [hydrocodone-acetaminophen] Other (See Comments)   Reaction:  Affected pts cognition    Morphine And Related Other (See Comments)   Reaction:  Affected pts cognition         Medication List        Accurate as of March 18, 2021 11:34 AM. If you have any questions, ask your nurse or doctor.          STOP taking these medications    LORazepam 0.5 MG tablet Commonly known as: Ativan Stopped by: Royal Hawthorn, NP       TAKE these medications     acetaminophen 650 MG suppository Commonly known as: TYLENOL Place 650 mg rectally every 4 (four) hours as needed for fever.   acetaminophen 325 MG tablet Commonly known as: TYLENOL Take 650 mg by mouth 3 (three) times daily.   bisacodyl 10 MG suppository Commonly known as: DULCOLAX Place 10 mg rectally daily as needed (constipation).   clotrimazole 1 % cream Commonly known as: Lotrimin AF Apply 1 application topically 2 (two) times daily as needed.   haloperidol 0.5 MG tablet Commonly known as: HALDOL Take 0.5 mg by mouth every 6 (six) hours as needed for agitation.   oxyCODONE 5 MG/5ML solution Commonly known as: ROXICODONE Take 2.5 mLs (2.5 mg total) by mouth every 4 (four) hours as needed for moderate pain.   sennosides 8.8 MG/5ML syrup Commonly known as: SENOKOT Take 5 mLs by mouth 2 (two) times daily.        Review of Systems  Unable to perform ROS: Dementia   Immunization History  Administered Date(s) Administered  . Influenza, High Dose Seasonal PF 04/17/2019, 04/23/2020  . Influenza,inj,Quad PF,6+ Mos 04/23/2018  . Influenza-Unspecified 05/05/2010, 04/11/2013, 03/03/2015, 09/06/2016, 05/10/2017, 04/27/2020  . Moderna SARS-COV2 Booster Vaccination 05/13/2020  . Moderna Sars-Covid-2 Vaccination 07/08/2019, 08/05/2019  . Pneumococcal Polysaccharide-23 12/25/2010  . Pneumococcal-Unspecified 02/28/2013  . Td 06/01/2017  . Tdap 03/22/2006  . Zoster Recombinat (Shingrix) 05/25/2017, 09/25/2017  . Zoster, Live 11/01/2012   Pertinent  Health Maintenance Due  Topic Date Due  . INFLUENZA VACCINE  01/31/2021  . PNA vac Low Risk Adult  Completed   Fall Risk  07/27/2020 05/08/2019 09/21/2018 05/29/2018 05/29/2017  Falls in the past year? 1 1 Exclusion - non ambulatory 0 No  Number falls in past yr: 0 1 - 0 -  Injury with Fall? 1 1 - 0 -  Risk for fall due to : History of fall(s);Medication side effect History of fall(s);Impaired balance/gait - - -  Follow up  Falls evaluation completed Falls evaluation completed - - -   Functional Status Survey:    Vitals:   03/18/21 1125  BP: 106/64  Pulse: (!) 54  Resp: 16  Temp: (!) 97 F (36.1 C)  SpO2: 98%  Weight: 154 lb (69.9 kg)  Height: '5\' 5"'$  (1.651 m)   Body mass index is 25.63 kg/m. Physical Exam Vitals and nursing note reviewed.  Constitutional:      General: He is not in acute distress.    Appearance: He is not diaphoretic.  HENT:     Head: Normocephalic and atraumatic.  Neck:     Thyroid: No thyromegaly.     Vascular: No JVD.     Trachea: No tracheal deviation.  Cardiovascular:     Rate and Rhythm: Regular rhythm. Bradycardia present.     Heart sounds: No murmur heard. Pulmonary:     Effort: Pulmonary  effort is normal. No respiratory distress.     Breath sounds: Normal breath sounds. No wheezing.  Abdominal:     General: Bowel sounds are normal. There is no distension.     Palpations: Abdomen is soft.     Tenderness: There is no abdominal tenderness.  Musculoskeletal:     Cervical back: Normal range of motion and neck supple.     Right lower leg: No edema.     Left lower leg: No edema.  Lymphadenopathy:     Cervical: No cervical adenopathy.  Skin:    General: Skin is warm and dry.  Neurological:     Mental Status: He is alert. Mental status is at baseline.     Comments: Mumbles, not able to f/c. Decreased ROM to BUE and BLE.   Psychiatric:     Comments: Agitated, attempted to punch me.     Labs reviewed: Recent Labs    10/21/20 0207 10/22/20 0446  NA 140 144  K 4.4 4.1  CL 108 112*  CO2 22 22  GLUCOSE 245* 133*  BUN 27* 25*  CREATININE 1.34* 1.20  CALCIUM 8.1* 8.2*  MG 1.9 1.8   Recent Labs    10/21/20 0207 10/22/20 0446  AST 19 19  ALT 12 11  ALKPHOS 120 85  BILITOT 1.1 0.6  PROT 6.5 5.6*  ALBUMIN 3.7 3.1*   Recent Labs    10/21/20 0207 10/22/20 0446  WBC 10.4 9.7  NEUTROABS 9.5*  --   HGB 12.8* 10.4*  HCT 39.8 32.1*  MCV 98.0 97.9   PLT 214 183   Lab Results  Component Value Date   TSH 2.45 01/09/2019   Lab Results  Component Value Date   HGBA1C 7.1 (H) 10/21/2020   Lab Results  Component Value Date   CHOL 124 10/26/2016   HDL 40 10/26/2016   LDLCALC 62 10/26/2016   TRIG 111 10/26/2016    Significant Diagnostic Results in last 30 days:  No results found.  Assessment/Plan  1. Bilateral primary osteoarthritis of knee No current pain issues Continue oxycodone as needed   2. 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 Type 2 diabetes mellitus with neurological complications (HCC) No longer monitoring due to goals of care.  Has been stable in the past and has not needed meds  4. Diabetic peripheral neuropathy associated with type 2 diabetes mellitus (HCC) No current pain issues, off neurontin   5. Oropharyngeal dysphagia Continue D1 diet  Asp prec 1:1 sypervision   6. Coronary artery disease of native artery of native heart with stable angina pectoris ([place]) Off plavix hospice  7. Stage 3b chronic kidney disease ([place]) Avoid nephrotoxic agents Hydrate when able.   Family/ staff Communication: nurse  Labs/tests ordered:  NA

## 2021-04-12 ENCOUNTER — Non-Acute Institutional Stay (SKILLED_NURSING_FACILITY): Payer: Medicare Other | Admitting: Orthopedic Surgery

## 2021-04-12 ENCOUNTER — Encounter: Payer: Self-pay | Admitting: Orthopedic Surgery

## 2021-04-12 DIAGNOSIS — I25118 Atherosclerotic heart disease of native coronary artery with other forms of angina pectoris: Secondary | ICD-10-CM

## 2021-04-12 DIAGNOSIS — E1149 Type 2 diabetes mellitus with other diabetic neurological complication: Secondary | ICD-10-CM | POA: Diagnosis not present

## 2021-04-12 DIAGNOSIS — M170 Bilateral primary osteoarthritis of knee: Secondary | ICD-10-CM

## 2021-04-12 DIAGNOSIS — R1312 Dysphagia, oropharyngeal phase: Secondary | ICD-10-CM | POA: Diagnosis not present

## 2021-04-12 DIAGNOSIS — F0280 Dementia in other diseases classified elsewhere without behavioral disturbance: Secondary | ICD-10-CM

## 2021-04-12 DIAGNOSIS — G3109 Other frontotemporal dementia: Secondary | ICD-10-CM | POA: Diagnosis not present

## 2021-04-12 DIAGNOSIS — K5901 Slow transit constipation: Secondary | ICD-10-CM

## 2021-04-12 DIAGNOSIS — H6121 Impacted cerumen, right ear: Secondary | ICD-10-CM

## 2021-04-12 NOTE — Progress Notes (Signed)
Location:  [facility] Room Number: [place] of Service:  SNF [phone]) Provider:  Windell Moulding, AGNP-C  [name], MD  Patient Care Team: [name], MD as PCP - General (Internal Medicine)  Extended Emergency Contact Information Primary Emergency Contact: Alley,Kelly Address: [address]          [place], [place] [address] [name] of [place] Phone: [phone] Relation: Daughter Secondary Emergency Contact: Sitton,[name] Address: [address] [place] [address] [place] of [place] Phone: [phone] Relation: Son  Code Status:  DNR Goals of care: Advanced Directive information Advanced Directives 03/18/2021  Does Patient Have a Medical Advance Directive? Yes  Type of Advance Directive [place]  Does patient want to make changes to medical advance directive? No - Patient declined  Copy of [place] in Chart? Yes - validated most recent copy scanned in chart (See row information)  Would patient like information on creating a medical advance directive? -  Pre-existing out of facility DNR order (yellow form or pink MOST form) -     Chief Complaint  Patient presents with   Medical Management of Chronic Issues    HPI:  Pt is a 80 y.o. male seen today for medical management of chronic diseases.    He currently resides on the skilled nursing unit at [facility] Past medical history includes: HTN, CAD, dysphagia, T2DM, HLD, frontal dementia, squamous cell cancer, BPH, CKD stage 3, constipation, hyperuricemia and gait abnormality.   Frontal dementia- CT head 2021 revealed soft tissue swelling over right frontal skull and chronic small vessel ischemia, no recent behavioral outbursts, followed by hospice, remains on haldol bid and ativan prn Dysphagia- no recent aspirations, DYS1 diet with nectar thick fluids CAD- off Plavix, followed by hospice T2DM- a1c 7.1 10/2020, off  medication at this time OA bilateral knees- oxycodone 5 mg qid prn Constipation- LBM 10/11, remains on senna syrup and dulcolax suppository prn  No recent falls or injuries. Observed in [place] chair today.   Recent blood pressures:  10/11- 110/71  10/04- 100/65  09/27- 96/60  Recent weights:  10/12- 152.2 lbs  09/02- 154 lbs  07/05- 159.8 lbs    Past Medical History:  Diagnosis Date   Benign hypertensive heart disease without heart failure 04/10/2016   BPH without obstruction/lower urinary tract symptoms 04/05/2016   CKD (chronic kidney disease) stage 3, GFR 30-59 ml/min (HCC) 01/08/2019   Constipation 06/22/2017   Coronary artery disease of native artery of native heart with stable angina pectoris ([place]) 04/10/2016   Dementia ([place])    Diabetes ([place])    Dysphagia 06/12/2019   Essential tremor 09/04/2016   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) 9/52/8413   Past Surgical History:  Procedure Laterality Date   APPENDECTOMY  1961   CORONARY ANGIOPLASTY WITH STENT PLACEMENT  2016   TONSILLECTOMY AND ADENOIDECTOMY     TOTAL KNEE ARTHROPLASTY  2012    Allergies  Allergen Reactions   Lortab [Hydrocodone-Acetaminophen] Other (See Comments)    Reaction:  Affected pts cognition  Morphine And Related Other (See Comments)    Reaction:  Affected pts cognition     Outpatient Encounter Medications as of 04/12/2021  Medication Sig   acetaminophen (TYLENOL) 325 MG tablet Take 650 mg by mouth 3 (three) times daily.   acetaminophen (TYLENOL) 650 MG suppository Place 650 mg rectally every 4 (four) hours as needed for fever.   bisacodyl (DULCOLAX) 10 MG  suppository Place 10 mg rectally daily as needed (constipation).    clotrimazole (LOTRIMIN AF) 1 % cream Apply 1 application topically 2 (two) times daily as needed.   haloperidol (HALDOL) 0.5 MG tablet Take 0.5 mg by mouth every 6 (six) hours as needed for agitation.   oxyCODONE (ROXICODONE) 5 MG/5ML solution Take 2.5 mLs (2.5 mg total) by mouth every 4 (four) hours as needed for moderate pain.   sennosides (SENOKOT) 8.8 MG/5ML syrup Take 5 mLs by mouth 2 (two) times daily.   No facility-administered encounter medications on file as of 04/12/2021.    Review of Systems  Unable to perform ROS: Dementia   Immunization History  Administered Date(s) Administered   Influenza, High Dose Seasonal PF 04/17/2019, 04/23/2020   Influenza,inj,Quad PF,6+ Mos 04/23/2018   Influenza-Unspecified 05/05/2010, 04/11/2013, 03/03/2015, 09/06/2016, 05/10/2017, 04/27/2020   Moderna SARS-COV2 Booster Vaccination 05/13/2020, 02/24/2021   Moderna Sars-Covid-2 Vaccination 07/08/2019, 08/05/2019   Pneumococcal Polysaccharide-23 12/25/2010   Pneumococcal-Unspecified 02/28/2013   Td 06/01/2017   Tdap 03/22/2006   Zoster Recombinat (Shingrix) 05/25/2017, 09/25/2017   Zoster, Live 11/01/2012   Pertinent  Health Maintenance Due  Topic Date Due   INFLUENZA VACCINE  01/31/2021   Fall Risk  07/27/2020 05/08/2019 09/21/2018 05/29/2018 05/29/2017  Falls in the past year? 1 1 Exclusion - non ambulatory 0 No  Number falls in past yr: 0 1 - 0 -  Injury with Fall? 1 1 - 0 -  Risk for fall due to : History of fall(s);Medication side effect History of fall(s);Impaired balance/gait - - -  Follow up Falls evaluation completed Falls evaluation completed - - -   Functional Status Survey:    Vitals:   04/12/21 1553  BP: 110/71  Pulse: (!) 53  Resp: 18  Temp: (!) 96.8 F (36 C)  SpO2: 94%  Weight: 154 lb (69.9 kg)   Body mass index is 25.63 kg/m. Physical Exam Vitals reviewed.  Constitutional:      General: He is  not in acute distress. HENT:     Head: Normocephalic.     Right Ear: There is impacted cerumen.     Left Ear: There is no impacted cerumen.     Nose: Nose normal.     Mouth/Throat:     Mouth: Mucous membranes are moist.  Eyes:     General:        Right eye: No discharge.        Left eye: No discharge.  Neck:     Vascular: No carotid bruit.  Cardiovascular:     Rate and Rhythm: Bradycardia present.     Pulses: Normal pulses.     Heart sounds: No murmur heard. Pulmonary:     Effort: Pulmonary effort is normal. No respiratory distress.     Breath sounds: Normal breath sounds. No wheezing.  Abdominal:     General: Bowel sounds are normal. There is no distension.     Palpations: Abdomen is soft.     Tenderness: There is no abdominal tenderness.  Musculoskeletal:     Cervical back: Normal range of motion.  Right lower leg: No edema.     Left lower leg: No edema.  Lymphadenopathy:     Cervical: No cervical adenopathy.  Skin:    General: Skin is warm and dry.     Capillary Refill: Capillary refill takes less than 2 seconds.  Neurological:     General: No focal deficit present.     Mental Status: He is alert. Mental status is at baseline.     Motor: Weakness present.     Gait: Gait abnormal.     Comments: [place] chair, total assist with ADLs  Psychiatric:        Mood and Affect: Mood normal.        Behavior: Behavior normal.        Cognition and Memory: Memory is impaired.     Comments: Nonverbal today with some moans    Labs reviewed: Recent Labs    10/21/20 0207 10/22/20 0446  NA 140 144  K 4.4 4.1  CL 108 112*  CO2 22 22  GLUCOSE 245* 133*  BUN 27* 25*  CREATININE 1.34* 1.20  CALCIUM 8.1* 8.2*  MG 1.9 1.8   Recent Labs    10/21/20 0207 10/22/20 0446  AST 19 19  ALT 12 11  ALKPHOS 120 85  BILITOT 1.1 0.6  PROT 6.5 5.6*  ALBUMIN 3.7 3.1*   Recent Labs    10/21/20 0207 10/22/20 0446  WBC 10.4 9.7  NEUTROABS 9.5*  --   HGB 12.8* 10.4*  HCT 39.8  32.1*  MCV 98.0 97.9  PLT 214 183   Lab Results  Component Value Date   TSH 2.45 01/09/2019   Lab Results  Component Value Date   HGBA1C 7.1 (H) 10/21/2020   Lab Results  Component Value Date   CHOL 124 10/26/2016   HDL 40 10/26/2016   LDLCALC 62 10/26/2016   TRIG 111 10/26/2016    Significant Diagnostic Results in last 30 days:  No results found.  Assessment/Plan 1. 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 Oropharyngeal dysphagia - no recent aspirations - cont DYS1 and nectar thick fluids  3. Coronary artery disease of native artery of native heart with stable angina pectoris ([place]) - off plavix due to goals of care  4. Type 2 diabetes mellitus with neurological complications (HCC) - not on medications at this time due to goals of care  5. Bilateral primary osteoarthritis of knee - cont oxycodone  6. Slow transit constipation - LBM 10/11 - cont liquid senna and ducolax suppository prn  7. Impacted cerumen of right ear - cannot visualize TM - start debrox drops and flush with water per [facility] protocol    Family/ staff Communication: plan discussed with nurse  Labs/tests ordered:   none

## 2021-04-29 ENCOUNTER — Other Ambulatory Visit: Payer: Self-pay | Admitting: Adult Health

## 2021-04-29 MED ORDER — LORAZEPAM 0.5 MG PO TABS: 0.5000 mg | ORAL_TABLET | Freq: Four times a day (QID) | ORAL | 2 refills | Status: DC | PRN

## 2021-05-07 IMAGING — CT CT HEAD W/O CM
4 series · 15 of 47 positions shown, 17 images · non-contrast
Comparison: March 19, 2018

CLINICAL DATA: Fall

EXAM:
CT HEAD WITHOUT CONTRAST
TECHNIQUE: Contiguous axial images were obtained from the base of the skull
through the vertex without intravenous contrast.

[Series 3: head without · axial · non-contrast · 0.43mm/px · z∈[-104,+21]mm · 7 of 35 slices shown, 9 images]
[im 5/35  brain]
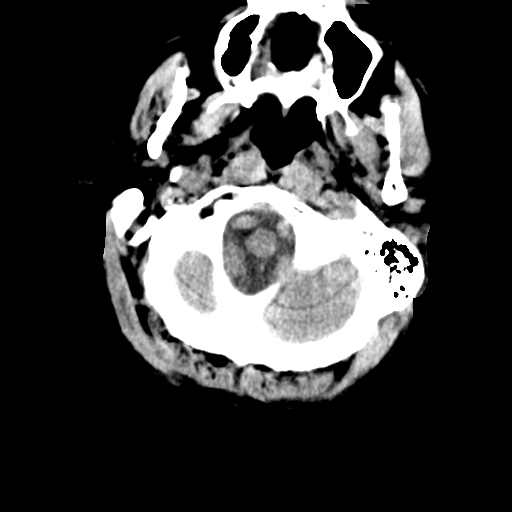
[im 5/35  bone]
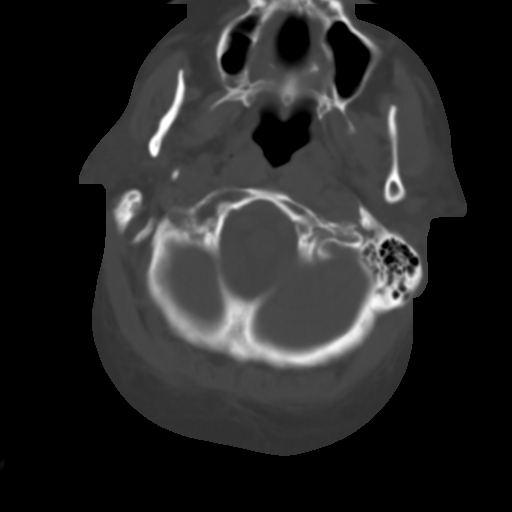
[im 9/35  brain]
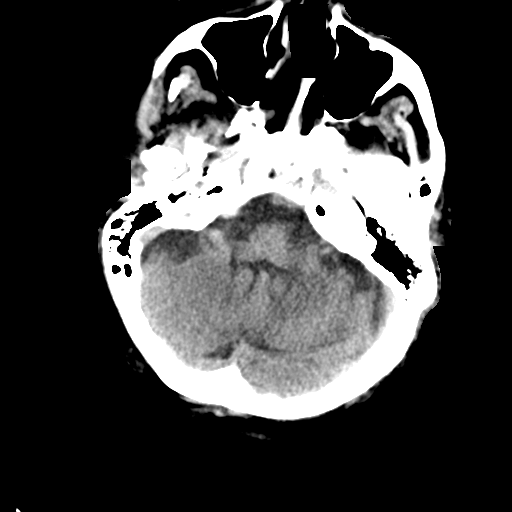
[im 13/35  brain]
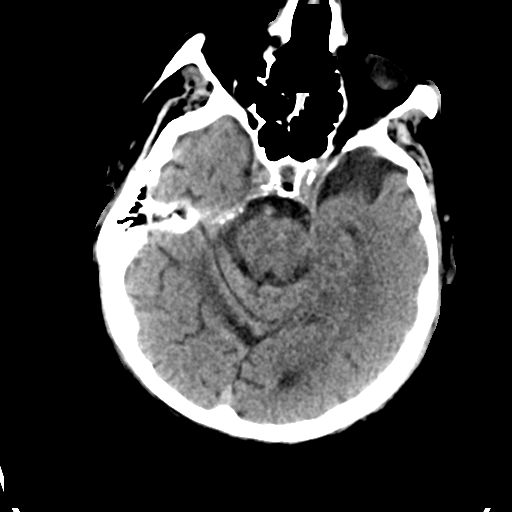
[im 18/35  brain]
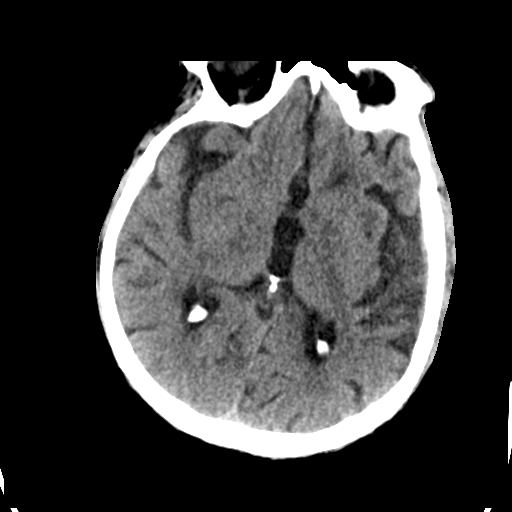
[im 22/35  brain]
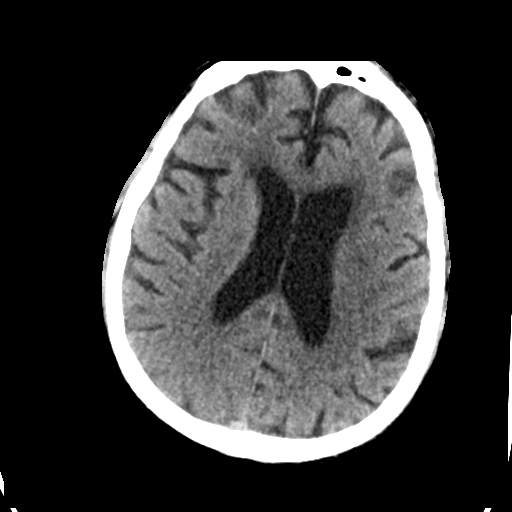
[im 22/35  bone]
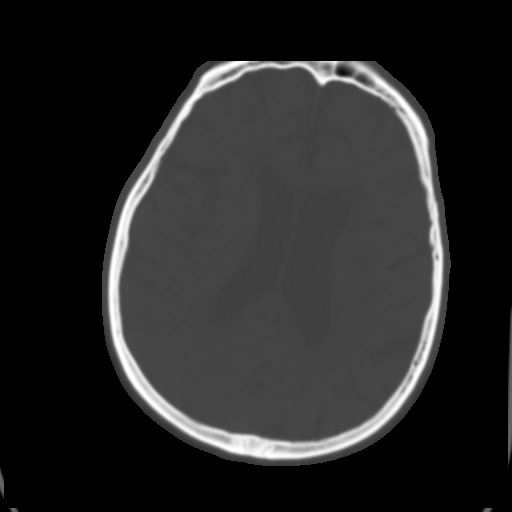
[im 26/35  brain]
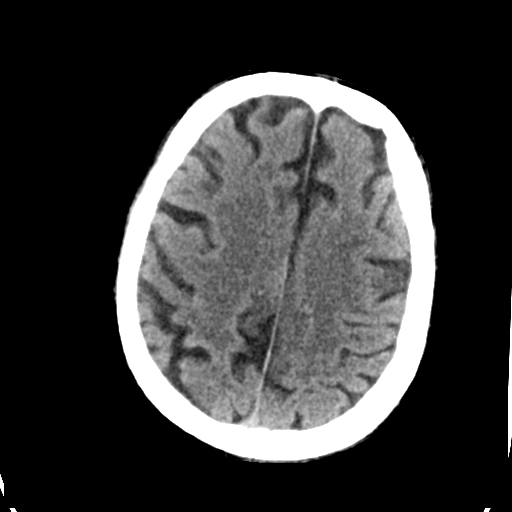
[im 30/35  brain]
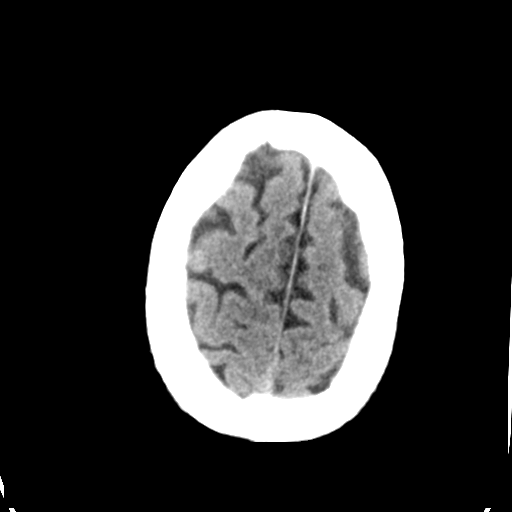

[Series 4: head bone · axial · 0.43mm/px · z∈[-108,-90]mm · 2 of 86 slices shown]
[im 9/86  bone]
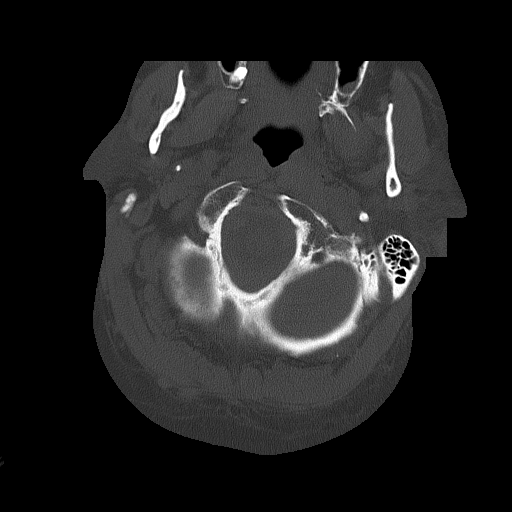
[im 18/86  bone]
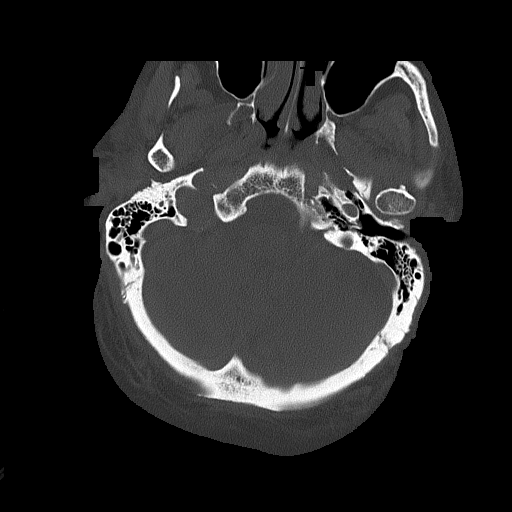

[Series 5: head without cor · coronal · non-contrast · 0.44mm/px · 3 of 81 slices shown]
[im 27/81  brain]
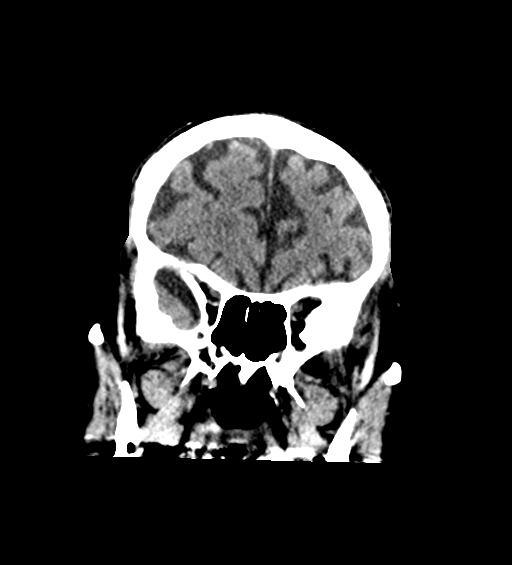
[im 36/81  brain]
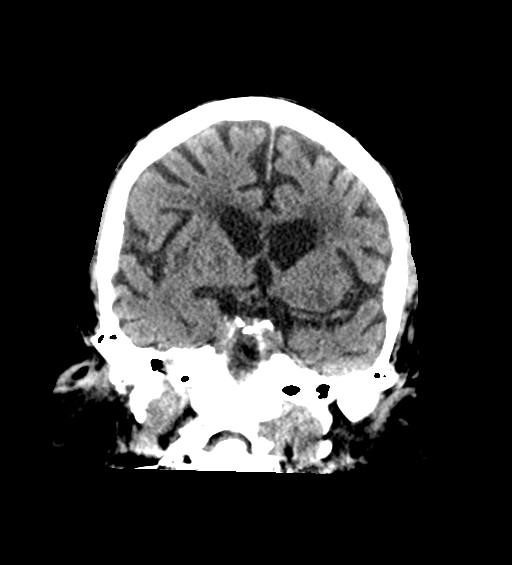
[im 45/81  brain]
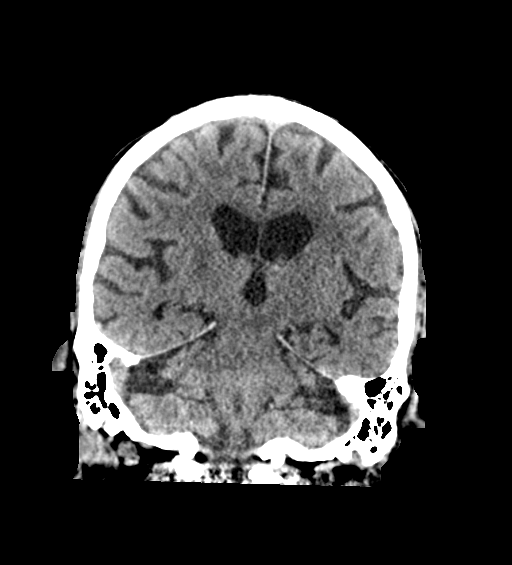

[Series 6: head without sag · sagittal · non-contrast · 0.41mm/px · 3 of 67 slices shown]
[im 23/67  brain]
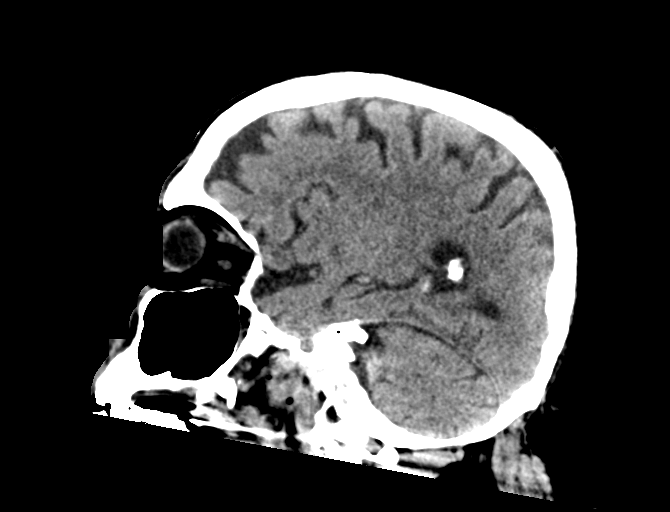
[im 34/67  brain]
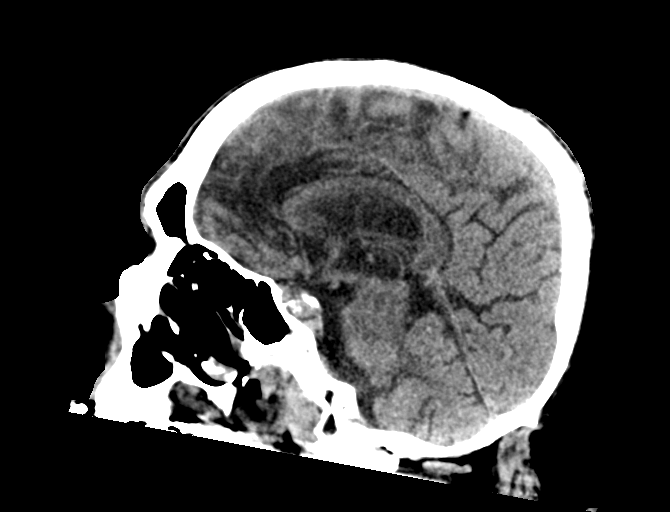
[im 45/67  brain]
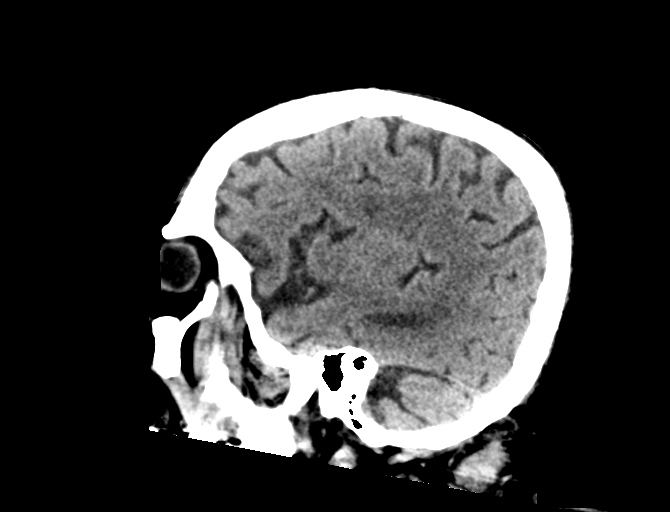

[15 of 47 positions shown; findings below may reference images not displayed]

FINDINGS: Brain: No evidence of acute territorial infarction, hemorrhage,
hydrocephalus,extra-axial collection or mass lesion/mass effect.
There is dilatation the ventricles and sulci consistent with
age-related atrophy. Low-attenuation changes in the deep white
matter consistent with small vessel ischemia.

Vascular: No hyperdense vessel or unexpected calcification.

Skull: The skull is intact. No fracture or focal lesion identified.

Sinuses/Orbits: The visualized paranasal sinuses and mastoid air
cells are clear. The orbits and globes intact.

Other: Soft tissue swelling seen over the right frontal skull.

Face:

Osseous: No acute fracture or other significant osseous
abnormality.The nasal bone, mandibles, zygomatic arches and
pterygoid plates are intact.

Orbits: No fracture identified. Unremarkable appearance of globes
and orbits.

Sinuses: The visualized paranasal sinuses and mastoid air cells are
unremarkable.

Soft tissues:  No acute findings.

Limited intracranial: No acute findings.

Cervical spine:

Alignment: Physiologic

Skull base and vertebrae: Visualized skull base is intact. No
atlanto-occipital dissociation. The vertebral body heights are well
maintained. No fracture or pathologic osseous lesion seen.

Soft tissues and spinal canal: The visualized paraspinal soft
tissues are unremarkable. No prevertebral soft tissue swelling is
seen. The spinal canal is grossly unremarkable, no large epidural
collection or significant canal narrowing.

Disc levels: Multilevel cervical spine spondylosis is seen with disc
osteophyte complex and uncovertebral osteophytes most notable at
C4-C5 and C5-C6 with moderate to severe neural foraminal narrowing
and mild central canal stenosis.

Upper chest: The lung apices are clear. Thoracic inlet is within
normal limits. Scattered aortic atherosclerosis is noted.

Other: None
IMPRESSION: 1. No acute intracranial abnormality.
2. Findings consistent with age related atrophy and chronic small
vessel ischemia
3. Soft tissue swelling over the right frontal skull
4. No acute facial fracture
5.  No acute fracture or malalignment of the spine.
6.  Aortic Atherosclerosis ([id number]).

## 2021-05-17 ENCOUNTER — Encounter: Payer: Self-pay | Admitting: Orthopedic Surgery

## 2021-05-17 ENCOUNTER — Non-Acute Institutional Stay (SKILLED_NURSING_FACILITY): Payer: Medicare Other | Admitting: Orthopedic Surgery

## 2021-05-17 DIAGNOSIS — K5901 Slow transit constipation: Secondary | ICD-10-CM | POA: Diagnosis not present

## 2021-05-17 DIAGNOSIS — E1149 Type 2 diabetes mellitus with other diabetic neurological complication: Secondary | ICD-10-CM | POA: Diagnosis not present

## 2021-05-17 DIAGNOSIS — H6121 Impacted cerumen, right ear: Secondary | ICD-10-CM | POA: Diagnosis not present

## 2021-05-17 DIAGNOSIS — G3109 Other frontotemporal dementia: Secondary | ICD-10-CM

## 2021-05-17 DIAGNOSIS — M170 Bilateral primary osteoarthritis of knee: Secondary | ICD-10-CM | POA: Diagnosis not present

## 2021-05-17 DIAGNOSIS — B3742 Candidal balanitis: Secondary | ICD-10-CM | POA: Diagnosis not present

## 2021-05-17 DIAGNOSIS — F0280 Dementia in other diseases classified elsewhere without behavioral disturbance: Secondary | ICD-10-CM

## 2021-05-17 DIAGNOSIS — Z66 Do not resuscitate: Secondary | ICD-10-CM | POA: Diagnosis not present

## 2021-05-17 DIAGNOSIS — R634 Abnormal weight loss: Secondary | ICD-10-CM | POA: Diagnosis not present

## 2021-05-17 DIAGNOSIS — R1312 Dysphagia, oropharyngeal phase: Secondary | ICD-10-CM

## 2021-05-17 DIAGNOSIS — I25118 Atherosclerotic heart disease of native coronary artery with other forms of angina pectoris: Secondary | ICD-10-CM

## 2021-05-17 NOTE — Progress Notes (Signed)
Location:  [facility] Room Number: 114-A Place of Service:  SNF (31) Provider:  Yvonna Alanis, NP   Patient Care Team: [name], MD as PCP - General (Internal Medicine)  Extended Emergency Contact Information Primary Emergency ContactShirline Frees Address: [address]          [place], [place] [address] [name] of [place] Phone: [phone] Relation: Daughter Secondary Emergency Contact: Filippi,[name] Address: [address] [place] [address] [place] Phone: [phone] Relation: Son  Code Status:  DNR Goals of care: Advanced Directive information Advanced Directives 05/17/2021  Does Patient Have a Medical Advance Directive? Yes  Type of Paramedic of [place];Out of facility DNR (pink MOST or yellow form)  Does patient want to make changes to medical advance directive? No - Patient declined  Copy of [place] in Chart? Yes - validated most recent copy scanned in chart (See row information)  Would patient like information on creating a medical advance directive? -  Pre-existing out of facility DNR order (yellow form or pink MOST form) Yellow form placed in chart (order not valid for inpatient use);Pink MOST form placed in chart (order not valid for inpatient use)     Chief Complaint  Patient presents with   Medical Management of Chronic Issues    Routine visit,  discuss need for pneumonia vaccine, and any additional covid vaccines needed or postpone/exclude if patient refuses.     HPI:  Pt is a 80 y.o. male seen today for medical management of chronic diseases.    He currently resides on the skilled nursing unit at [facility] Past medical history includes: HTN, CAD, dysphagia, T2DM, HLD, frontal dementia, squamous cell cancer, BPH, CKD stage 3, constipation, hyperuricemia and gait abnormality.   Frontal dementia- CT head 2021 revealed soft tissue  swelling over right frontal skull and chronic small vessel ischemia, no recent behavioral outbursts, followed by hospice, remains on haldol bid and ativan prn Candidal balanitis- resolved with clobetasol and clotrimazole x 10 days Dysphagia- no recent aspirations, DYS1 diet with nectar thick fluids CAD- off Plavix, followed by hospice T2DM- a1c 7.1 10/2020, off medication at this time OA bilateral knees- remains on oxycodone prn, non ambulatory Constipation- LBM 11/14, remains on senna syrup and dulcolax suppository prn Weight loss- progressive weight loss in past 6 months, averaging 2-3 lbs per month, will sleep during meals, consumes 75-100% of meals twice daily  No recent falls or injuries. Ambulates in [place] chair.   Recent blood pressures:  11/15- 92/54  11/08- 116/70  11/01- 105/67  Recent weights:  11/07- 150.8 lbs  10/12- 152.2 lbs  09/02- 154 lbs  07/05- 159.8 lbs       Past Medical History:  Diagnosis Date   Benign hypertensive heart disease without heart failure 04/10/2016   BPH without obstruction/lower urinary tract symptoms 04/05/2016   CKD (chronic kidney disease) stage 3, GFR 30-59 ml/min (HCC) 01/08/2019   Constipation 06/22/2017   Coronary artery disease of native artery of native heart with stable angina pectoris ([place]) 04/10/2016   Dementia ([place])    Diabetes ([place])    Dysphagia 06/12/2019   Essential tremor 09/04/2016   Frontal dementia ([place]) 09/04/2016   Gait abnormality 11/13/2016   Heart disease    Hyperlipidemia    Hyperlipidemia associated with type 2 diabetes mellitus ([place]) 04/05/2016   Hyperuricemia 10/31/2016   Hypotension  Partial retinal detachment of left eye 04/10/2016   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 04/10/2016   Type 2 diabetes mellitus with neurological complications ([place]) 12/07/43   Past Surgical History:  Procedure Laterality Date   APPENDECTOMY  1961   CORONARY ANGIOPLASTY WITH STENT PLACEMENT  2016   TONSILLECTOMY AND  ADENOIDECTOMY     TOTAL KNEE ARTHROPLASTY  2012    Allergies  Allergen Reactions   Lortab [Hydrocodone-Acetaminophen] Other (See Comments)    Reaction:  Affected pts cognition    Morphine And Related Other (See Comments)    Reaction:  Affected pts cognition     Outpatient Encounter Medications as of 05/17/2021  Medication Sig   acetaminophen (TYLENOL) 325 MG tablet Take 650 mg by mouth 3 (three) times daily.   acetaminophen (TYLENOL) 650 MG suppository Place 650 mg rectally every 4 (four) hours as needed for fever.   bisacodyl (DULCOLAX) 10 MG suppository Place 10 mg rectally daily as needed (constipation).    clotrimazole (LOTRIMIN AF) 1 % cream Apply 1 application topically 2 (two) times daily as needed.   haloperidol (HALDOL) 0.5 MG tablet Take 0.5 mg by mouth 2 (two) times daily.   LORazepam (ATIVAN) 0.5 MG tablet Take 1 tablet (0.5 mg total) by mouth every 6 (six) hours as needed.   oxyCODONE (ROXICODONE) 5 MG/5ML solution Take 2.5 mLs (2.5 mg total) by mouth every 4 (four) hours as needed for moderate pain.   sennosides (SENOKOT) 8.8 MG/5ML syrup Take 5 mLs by mouth 2 (two) times daily.   No facility-administered encounter medications on file as of 05/17/2021.    Review of Systems  Unable to perform ROS: Dementia   Immunization History  Administered Date(s) Administered   Influenza, High Dose Seasonal PF 04/17/2019, 04/23/2020, 04/06/2021   Influenza,inj,Quad PF,6+ Mos 04/23/2018   Influenza-Unspecified 05/05/2010, 04/11/2013, 03/03/2015, 09/06/2016, 05/10/2017, 04/27/2020   Moderna Covid-19 Vaccine Bivalent Booster 41yrs & up 04/13/2021   Moderna SARS-COV2 Booster Vaccination 05/13/2020, 02/24/2021   Moderna Sars-Covid-2 Vaccination 07/08/2019, 08/05/2019   Pneumococcal Polysaccharide-23 12/25/2010   Pneumococcal-Unspecified 02/28/2013   Td 06/01/2017   Tdap 03/22/2006   Zoster Recombinat (Shingrix) 05/25/2017, 09/25/2017   Zoster, Live 11/01/2012   Pertinent   Health Maintenance Due  Topic Date Due   INFLUENZA VACCINE  Completed   Fall Risk 10/22/2020 10/23/2020 10/23/2020 10/24/2020 10/24/2020  Falls in the past year? - - - - -  Was there an injury with Fall? - - - - -  Fall Risk Category Calculator - - - - -  Fall Risk Category - - - - -  Patient Fall Risk Level High fall risk High fall risk High fall risk High fall risk High fall risk  Patient at Risk for Falls Due to - - - - -  Fall risk Follow up - - - - -   Functional Status Survey:    Vitals:   05/17/21 1008  BP: 116/70  Pulse: (!) 53  Resp: 16  Temp: (!) 97.2 F (36.2 C)  SpO2: 100%  Weight: 150 lb 12.8 oz (68.4 kg)  Height: 5\' 5"  (1.651 m)   Body mass index is 25.09 kg/m. Physical Exam Vitals reviewed.  Constitutional:      General: He is not in acute distress. HENT:     Head: Normocephalic.     Right Ear: There is impacted cerumen.     Left Ear: There is no impacted cerumen.     Nose: Nose normal.     Mouth/Throat:  Mouth: Mucous membranes are moist.  Eyes:     General:        Right eye: No discharge.        Left eye: No discharge.  Neck:     Vascular: No carotid bruit.  Cardiovascular:     Rate and Rhythm: Normal rate and regular rhythm.     Pulses: Normal pulses.     Heart sounds: Normal heart sounds. No murmur heard. Pulmonary:     Effort: Pulmonary effort is normal. No respiratory distress.     Breath sounds: Normal breath sounds. No wheezing.  Abdominal:     General: Bowel sounds are normal. There is no distension.     Palpations: Abdomen is soft.     Tenderness: There is no abdominal tenderness.  Genitourinary:    Penis: Normal.   Musculoskeletal:     Cervical back: Normal range of motion.     Right lower leg: No edema.     Left lower leg: No edema.  Lymphadenopathy:     Cervical: No cervical adenopathy.  Skin:    General: Skin is warm and dry.     Capillary Refill: Capillary refill takes less than 2 seconds.  Neurological:     General: No  focal deficit present.     Mental Status: He is alert and oriented to person, place, and time.     Motor: Weakness present.     Gait: Gait abnormal.     Comments: [place] chair  Psychiatric:        Mood and Affect: Mood normal.        Behavior: Behavior normal.    Labs reviewed: Recent Labs    10/21/20 0207 10/22/20 0446  NA 140 144  K 4.4 4.1  CL 108 112*  CO2 22 22  GLUCOSE 245* 133*  BUN 27* 25*  CREATININE 1.34* 1.20  CALCIUM 8.1* 8.2*  MG 1.9 1.8   Recent Labs    10/21/20 0207 10/22/20 0446  AST 19 19  ALT 12 11  ALKPHOS 120 85  BILITOT 1.1 0.6  PROT 6.5 5.6*  ALBUMIN 3.7 3.1*   Recent Labs    10/21/20 0207 10/22/20 0446  WBC 10.4 9.7  NEUTROABS 9.5*  --   HGB 12.8* 10.4*  HCT 39.8 32.1*  MCV 98.0 97.9  PLT 214 183   Lab Results  Component Value Date   TSH 2.45 01/09/2019   Lab Results  Component Value Date   HGBA1C 7.1 (H) 10/21/2020   Lab Results  Component Value Date   CHOL 124 10/26/2016   HDL 40 10/26/2016   LDLCALC 62 10/26/2016   TRIG 111 10/26/2016    Significant Diagnostic Results in last 30 days:  No results found.  Assessment/Plan 1. Do not resuscitate  2. 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 Candidal balanitis - resolved with clobetasol and clotrimazole x 10 days  4. Oropharyngeal dysphagia - no recent aspirations - cont DYS1 diet with nectar thick fluids  5. Coronary artery disease of native artery of native heart with stable angina pectoris (HCC) - off Plavix due to goals of care  6. Type 2 diabetes mellitus with neurological complications (HCC) - off medications due to goals of care  7. Bilateral primary osteoarthritis of knee - nonambulatory - cont oxycodone prn  8. Slow transit constipation - LBM 11/14 - cont senna and dulcolax suppository prn  9. Weight  loss - losing about 2-3 lbs per month - cont monthly  weights  10. Impacted cerumen of right ear - impacted last month - cannot visualize TM today - start debrox/ flush ear per [facility] protocol     Family/ staff Communication: plan discussed with nurse  Labs/tests ordered:  none

## 2021-06-06 ENCOUNTER — Encounter: Payer: Self-pay | Admitting: Internal Medicine

## 2021-06-06 ENCOUNTER — Non-Acute Institutional Stay (SKILLED_NURSING_FACILITY): Admitting: Internal Medicine

## 2021-06-06 DIAGNOSIS — G3109 Other frontotemporal dementia: Secondary | ICD-10-CM

## 2021-06-06 DIAGNOSIS — F0280 Dementia in other diseases classified elsewhere without behavioral disturbance: Secondary | ICD-10-CM | POA: Diagnosis not present

## 2021-06-06 DIAGNOSIS — M170 Bilateral primary osteoarthritis of knee: Secondary | ICD-10-CM | POA: Diagnosis not present

## 2021-06-06 DIAGNOSIS — R1312 Dysphagia, oropharyngeal phase: Secondary | ICD-10-CM | POA: Diagnosis not present

## 2021-06-06 NOTE — Progress Notes (Signed)
Location:      Place of Service:     Provider: [name]   Code Status: DNR [place] Goals of Care:  Advanced Directives 05/17/2021  Does Patient Have a Medical Advance Directive? Yes  Type of Paramedic of [place];Out of facility DNR (pink MOST or yellow form)  Does patient want to make changes to medical advance directive? No - Patient declined  Copy of [place] in Chart? Yes - validated most recent copy scanned in chart (See row information)  Would patient like information on creating a medical advance directive? -  Pre-existing out of facility DNR order (yellow form or pink MOST form) Yellow form placed in chart (order not valid for inpatient use);Pink MOST form placed in chart (order not valid for inpatient use)     Chief Complaint  Patient presents with   Medical Management of Chronic Issues    Routine follow up visit.   Health Maintenance    Pneumonia vaccine, COVID booster    HPI: Patient is a 80 y.o. male seen today for medical management of chronic diseases.     Patient with End Stage Dementia, Dysphagia, Recurent UTI, CKD CAD s/p Stent Placement Now Under Hospice care  He  is stable.No new Nursing issues. No Behavior issues Has lost more weight No Falls Wheelchair Bound. Has Aphasia Does not respond or Follow commands. Seems to be having Hallucinbations Wt Readings from Last 3 Encounters:  06/06/21 150 lb 6.4 oz (68.2 kg)  05/17/21 150 lb 12.8 oz (68.4 kg)  04/12/21 154 lb (69.9 kg)    Past Medical History:  Diagnosis Date   Benign hypertensive heart disease without heart failure 04/10/2016   BPH without obstruction/lower urinary tract symptoms 04/05/2016   CKD (chronic kidney disease) stage 3, GFR 30-59 ml/min (HCC) 01/08/2019   Constipation 06/22/2017   Coronary artery disease of native artery of native heart with stable angina pectoris ([place]) 04/10/2016   Dementia ([place])    Diabetes ([place])    Dysphagia  06/12/2019   Essential tremor 09/04/2016   Frontal dementia ([place]) 09/04/2016   Gait abnormality 11/13/2016   Heart disease    Hyperlipidemia    Hyperlipidemia associated with type 2 diabetes mellitus ([place]) 04/05/2016   Hyperuricemia 10/31/2016   Hypotension    Partial retinal detachment of left eye 04/10/2016   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 04/10/2016   Type 2 diabetes mellitus with neurological complications ([place]) 07/03/7508    Past Surgical History:  Procedure Laterality Date   APPENDECTOMY  1961   CORONARY ANGIOPLASTY WITH STENT PLACEMENT  2016   TONSILLECTOMY AND ADENOIDECTOMY     TOTAL KNEE ARTHROPLASTY  2012    Allergies  Allergen Reactions   Lortab [Hydrocodone-Acetaminophen] Other (See Comments)    Reaction:  Affected pts cognition    Morphine And Related Other (See Comments)    Reaction:  Affected pts cognition     Outpatient Encounter Medications as of 06/06/2021  Medication Sig   acetaminophen (TYLENOL) 325 MG tablet Take 650 mg by mouth 3 (three) times daily.   acetaminophen (TYLENOL) 650 MG suppository Place 650 mg rectally every 4 (four) hours as needed for fever.   bisacodyl (DULCOLAX) 10 MG suppository Place 10 mg rectally daily as needed (constipation).    clotrimazole (LOTRIMIN AF) 1 % cream Apply 1 application topically 2 (two) times daily as needed.   haloperidol (HALDOL) 0.5 MG tablet Take 0.5 mg by mouth 2 (two) times daily.  LORazepam (ATIVAN) 0.5 MG tablet Take 1 tablet (0.5 mg total) by mouth every 6 (six) hours as needed.   oxyCODONE (ROXICODONE) 5 MG/5ML solution Take 2.5 mLs (2.5 mg total) by mouth every 4 (four) hours as needed for moderate pain.   sennosides (SENOKOT) 8.8 MG/5ML syrup Take 5 mLs by mouth 2 (two) times daily.   No facility-administered encounter medications on file as of 06/06/2021.    Review of Systems:  Review of Systems  Unable to perform ROS: Dementia   Health Maintenance  Topic Date Due   Pneumonia Vaccine 65+ Years  old (2 - PCV) 02/28/2014   COVID-19 Vaccine (3 - Moderna risk series) 04/13/2021   TETANUS/TDAP  06/02/2027   INFLUENZA VACCINE  Completed   Zoster Vaccines- Shingrix  Completed   HPV VACCINES  Aged Out    Physical Exam: Vitals:   06/06/21 1434  BP: 118/80  Pulse: 67  Resp: 16  Temp: (!) 97.3 F (36.3 C)  SpO2: 96%  Weight: 150 lb 6.4 oz (68.2 kg)  Height: 5\' 5"  (1.651 m)   Body mass index is 25.03 kg/m. Physical Exam Vitals reviewed.  HENT:     Head: Normocephalic.     Mouth/Throat:     Mouth: Mucous membranes are moist.     Pharynx: Oropharynx is clear.  Eyes:     Pupils: Pupils are equal, round, and reactive to light.  Cardiovascular:     Rate and Rhythm: Normal rate and regular rhythm.     Pulses: Normal pulses.     Heart sounds: No murmur heard. Pulmonary:     Effort: Pulmonary effort is normal. No respiratory distress.     Breath sounds: Normal breath sounds. No rales.  Abdominal:     General: Abdomen is flat. Bowel sounds are normal.     Palpations: Abdomen is soft.  Musculoskeletal:        General: No swelling.     Cervical back: Neck supple.  Skin:    General: Skin is warm.  Neurological:     General: No focal deficit present.     Mental Status: He is alert.     Comments: Aphasia Present  Psychiatric:        Mood and Affect: Mood normal.        Thought Content: Thought content normal.    Labs reviewed: Basic Metabolic Panel: Recent Labs    10/21/20 0207 10/22/20 0446  NA 140 144  K 4.4 4.1  CL 108 112*  CO2 22 22  GLUCOSE 245* 133*  BUN 27* 25*  CREATININE 1.34* 1.20  CALCIUM 8.1* 8.2*  MG 1.9 1.8   Liver Function Tests: Recent Labs    10/21/20 0207 10/22/20 0446  AST 19 19  ALT 12 11  ALKPHOS 120 85  BILITOT 1.1 0.6  PROT 6.5 5.6*  ALBUMIN 3.7 3.1*   No results for input(s): LIPASE, AMYLASE in the last 8760 hours. No results for input(s): AMMONIA in the last 8760 hours. CBC: Recent Labs    10/21/20 0207 10/22/20 0446   WBC 10.4 9.7  NEUTROABS 9.5*  --   HGB 12.8* 10.4*  HCT 39.8 32.1*  MCV 98.0 97.9  PLT 214 183   Lipid Panel: No results for input(s): CHOL, HDL, LDLCALC, TRIG, CHOLHDL, LDLDIRECT in the last 8760 hours. Lab Results  Component Value Date   HGBA1C 7.1 (H) 10/21/2020    Procedures since last visit: No results found.  Assessment/Plan  1. Frontal dementia ([place]) End stage  In Hospice care Continues to loose weight Supportive care  2. Oropharyngeal dysphagia On D1 Diet  3. Bilateral primary osteoarthritis of knee Continue Oxycodone   Labs/tests ordered:  * No order type specified *  [name], MD

## 2021-07-12 ENCOUNTER — Encounter: Payer: Self-pay | Admitting: Orthopedic Surgery

## 2021-07-12 ENCOUNTER — Non-Acute Institutional Stay (SKILLED_NURSING_FACILITY): Payer: Medicare Other | Admitting: Orthopedic Surgery

## 2021-07-12 DIAGNOSIS — M170 Bilateral primary osteoarthritis of knee: Secondary | ICD-10-CM

## 2021-07-12 DIAGNOSIS — K5901 Slow transit constipation: Secondary | ICD-10-CM | POA: Diagnosis not present

## 2021-07-12 DIAGNOSIS — G3109 Other frontotemporal dementia: Secondary | ICD-10-CM | POA: Diagnosis not present

## 2021-07-12 DIAGNOSIS — E1149 Type 2 diabetes mellitus with other diabetic neurological complication: Secondary | ICD-10-CM

## 2021-07-12 DIAGNOSIS — R634 Abnormal weight loss: Secondary | ICD-10-CM | POA: Diagnosis not present

## 2021-07-12 DIAGNOSIS — F0280 Dementia in other diseases classified elsewhere without behavioral disturbance: Secondary | ICD-10-CM

## 2021-07-12 DIAGNOSIS — H6121 Impacted cerumen, right ear: Secondary | ICD-10-CM | POA: Diagnosis not present

## 2021-07-12 DIAGNOSIS — I25118 Atherosclerotic heart disease of native coronary artery with other forms of angina pectoris: Secondary | ICD-10-CM

## 2021-07-12 DIAGNOSIS — R1312 Dysphagia, oropharyngeal phase: Secondary | ICD-10-CM

## 2021-07-12 NOTE — Progress Notes (Signed)
Location:   [facility] Room Number: 114 Place of Service:  SNF ([phone]) Provider:  Yvonna Alanis, NP   [name], MD  Patient Care Team: [name], MD as PCP - General (Internal Medicine)  Extended Emergency Contact Information Primary Emergency Contact: Alley,Kelly Address: [address]          [place], [place] [address] [name] of [place] Phone: [phone] Relation: Daughter Secondary Emergency Contact: Subramanian,[name] Address: [address] [place] [address] [place] of [place] Phone: [phone] Relation: Son  Code Status:  DNR Hospice Goals of care: Advanced Directive information Advanced Directives 07/12/2021  Does Patient Have a Medical Advance Directive? Yes  Type of Paramedic of [place];Out of facility DNR (pink MOST or yellow form)  Does patient want to make changes to medical advance directive? No - Patient declined  Copy of [place] in Chart? Yes - validated most recent copy scanned in chart (See row information)  Would patient like information on creating a medical advance directive? -  Pre-existing out of facility DNR order (yellow form or pink MOST form) Yellow form placed in chart (order not valid for inpatient use);Pink MOST form placed in chart (order not valid for inpatient use)     Chief Complaint  Patient presents with   Medical Management of Chronic Issues   Quality Metric Gaps    Pneumonia Vaccine 20+ Years old (2 - PCV)   Patient has had 5 COVID-19 Vaccine according to matrix.      HPI:  Pt is a 81 y.o. male seen today for medical management of chronic diseases.    He currently resides on the skilled nursing unit at [facility] Past medical history includes: HTN, CAD, dysphagia, T2DM, HLD, frontal dementia, squamous cell cancer, BPH, CKD stage 3, constipation, hyperuricemia and gait abnormality.   Frontal dementia- CT head  2021 revealed soft tissue swelling over right frontal skull and chronic small vessel ischemia, no recent behavioral outbursts, followed by hospice, remains on haldol bid and ativan prn Dysphagia- no recent aspirations, DYS1 diet with nectar thick fluids CAD- off Plavix T2DM- a1c 7.1 10/2020, off medication at this time OA bilateral knees- remains on scheduled tylenol, non ambulatory Constipation- LBM 01/10, remains on senna syrup and dulcolax suppository prn Weight loss- progressive weight loss in past 6 months, averaging 1-2 lbs per month, sometimes sleeps during meals, consumes 75-100% of meals twice daily  No recent falls or injuries. Nonambulatory. Remains in [place] chair during day.   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 heart failure 04/10/2016   BPH without obstruction/lower urinary tract symptoms 04/05/2016   CKD (chronic kidney disease) stage 3, GFR 30-59 ml/min (HCC) 01/08/2019   Constipation 06/22/2017   Coronary artery disease of native artery of native heart with stable angina pectoris ([place]) 04/10/2016   Dementia ([place])    Diabetes ([place])    Dysphagia 06/12/2019   Essential tremor 09/04/2016   Frontal dementia ([place]) 09/04/2016   Gait abnormality 11/13/2016   Heart disease    Hyperlipidemia    Hyperlipidemia associated with type 2 diabetes mellitus ([place]) 04/05/2016   Hyperuricemia 10/31/2016   Hypotension  Partial retinal detachment of left eye 04/10/2016   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 04/10/2016   Type 2 diabetes mellitus with neurological complications ([place]) 8/33/8250   Past Surgical History:  Procedure Laterality Date   APPENDECTOMY  1961   CORONARY ANGIOPLASTY WITH STENT PLACEMENT  2016   TONSILLECTOMY AND ADENOIDECTOMY     TOTAL KNEE ARTHROPLASTY  2012     Allergies  Allergen Reactions   Lortab [Hydrocodone-Acetaminophen] Other (See Comments)    Reaction:  Affected pts cognition    Morphine And Related Other (See Comments)    Reaction:  Affected pts cognition     Allergies as of 07/12/2021       Reactions   Lortab [hydrocodone-acetaminophen] Other (See Comments)   Reaction:  Affected pts cognition    Morphine And Related Other (See Comments)   Reaction:  Affected pts cognition         Medication List        Accurate as of July 12, 2021  3:39 PM. If you have any questions, ask your nurse or doctor.          STOP taking these medications    oxyCODONE 5 MG/5ML solution Commonly known as: ROXICODONE Stopped by: Yvonna Alanis, NP       TAKE these medications    acetaminophen 650 MG suppository Commonly known as: TYLENOL Place 650 mg rectally every 4 (four) hours as needed for fever.   acetaminophen 325 MG tablet Commonly known as: TYLENOL Take 650 mg by mouth 3 (three) times daily.   bisacodyl 10 MG suppository Commonly known as: DULCOLAX Place 10 mg rectally daily as needed (constipation).   carbamide peroxide 6.5 % OTIC solution Commonly known as: DEBROX Place 5 drops into the right ear every evening.   clotrimazole 1 % cream Commonly known as: Lotrimin AF Apply 1 application topically 2 (two) times daily as needed.   haloperidol 0.5 MG tablet Commonly known as: HALDOL Take 0.5 mg by mouth 2 (two) times daily.   LORazepam 0.5 MG tablet Commonly known as: ATIVAN Take 1 tablet (0.5 mg total) by mouth every 6 (six) hours as needed.   sennosides 8.8 MG/5ML syrup Commonly known as: SENOKOT Take 5 mLs by mouth 2 (two) times daily.        Review of Systems  Unable to perform ROS: Dementia   Immunization History  Administered Date(s) Administered   Influenza, High Dose Seasonal PF 04/17/2019, 04/23/2020, 04/06/2021   Influenza,inj,Quad PF,6+ Mos 04/23/2018   Influenza-Unspecified 05/05/2010,  04/11/2013, 03/03/2015, 09/06/2016, 05/10/2017, 04/27/2020   Moderna SARS-COV2 Booster Vaccination 05/13/2020, 02/24/2021   Moderna Sars-Covid-2 Vaccination 07/08/2019, 08/05/2019, 04/13/2021   Pneumococcal Polysaccharide-23 12/25/2010   Pneumococcal-Unspecified 02/28/2013   Td 06/01/2017   Tdap 03/22/2006   Zoster Recombinat (Shingrix) 05/25/2017, 09/25/2017   Zoster, Live 11/01/2012   Pertinent  Health Maintenance Due  Topic Date Due   INFLUENZA VACCINE  Completed   Fall Risk 10/22/2020 10/23/2020 10/23/2020 10/24/2020 10/24/2020  Falls in the past year? - - - - -  Was there an injury with Fall? - - - - -  Fall Risk Category Calculator - - - - -  Fall Risk Category - - - - -  Patient Fall Risk Level High fall risk High fall risk High fall risk High fall risk High fall risk  Patient at Risk for Falls Due to - - - - -  Fall risk Follow up - - - - -   Functional Status Survey:  Vitals:   07/12/21 1531  BP: 139/63  Pulse: (!) 51  Resp: 20  Temp: 97.6 F (36.4 C)  SpO2: 100%  Weight: 149 lb (67.6 kg)  Height: 5\' 5"  (1.651 m)   Body mass index is 24.79 kg/m. Physical Exam Vitals reviewed.  Constitutional:      General: He is not in acute distress. HENT:     Head: Normocephalic.     Right Ear: There is impacted cerumen.     Left Ear: There is impacted cerumen.     Nose: Nose normal.     Mouth/Throat:     Mouth: Mucous membranes are moist.     Pharynx: No oropharyngeal exudate.  Eyes:     General:        Right eye: No discharge.        Left eye: No discharge.  Cardiovascular:     Rate and Rhythm: Normal rate and regular rhythm.     Pulses: Normal pulses.     Heart sounds: Normal heart sounds.  Pulmonary:     Effort: Pulmonary effort is normal. No respiratory distress.     Breath sounds: Normal breath sounds. No wheezing.  Abdominal:     General: Bowel sounds are normal. There is no distension.     Palpations: Abdomen is soft.     Tenderness: There is no  abdominal tenderness.  Musculoskeletal:     Cervical back: Normal range of motion. Rigidity present.     Right lower leg: No edema.     Left lower leg: No edema.  Lymphadenopathy:     Cervical: No cervical adenopathy.  Skin:    General: Skin is warm and dry.     Capillary Refill: Capillary refill takes less than 2 seconds.  Neurological:     General: No focal deficit present.     Mental Status: He is alert. Mental status is at baseline.     Motor: Weakness present.     Gait: Gait abnormal.     Comments: [place] chair  Psychiatric:        Mood and Affect: Mood normal.        Behavior: Behavior normal.        Cognition and Memory: Memory is impaired.     Comments: Alert to self, nonverbal, does not follow commands    Labs reviewed: Recent Labs    10/21/20 0207 10/22/20 0446  NA 140 144  K 4.4 4.1  CL 108 112*  CO2 22 22  GLUCOSE 245* 133*  BUN 27* 25*  CREATININE 1.34* 1.20  CALCIUM 8.1* 8.2*  MG 1.9 1.8   Recent Labs    10/21/20 0207 10/22/20 0446  AST 19 19  ALT 12 11  ALKPHOS 120 85  BILITOT 1.1 0.6  PROT 6.5 5.6*  ALBUMIN 3.7 3.1*   Recent Labs    10/21/20 0207 10/22/20 0446  WBC 10.4 9.7  NEUTROABS 9.5*  --   HGB 12.8* 10.4*  HCT 39.8 32.1*  MCV 98.0 97.9  PLT 214 183   Lab Results  Component Value Date   TSH 2.45 01/09/2019   Lab Results  Component Value Date   HGBA1C 7.1 (H) 10/21/2020   Lab Results  Component Value Date   CHOL 124 10/26/2016   HDL 40 10/26/2016   LDLCALC 62 10/26/2016   TRIG 111 10/26/2016    Significant Diagnostic Results in last 30 days:  No results found.  Assessment/Plan 1. 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 Oropharyngeal dysphagia - no recent aspirations - cont DYS1 diet and nectar fluids  3. Coronary artery disease of native artery of native heart with stable angina pectoris ([place]) -  off plavix  4. Type 2 diabetes mellitus with neurological complications (HCC) - E8Y 7.1 10/2020 - off medication at this time  5. Bilateral primary osteoarthritis of knee - nonambulatory - cont scheduled tylenol  6. Slow transit constipation - LBM 01/10, abdomen soft - cont senna and ducolax  7. Weight loss - loosing about 1-2lbs/month - cont monthly weights  8. Impacted cerumen of right ear - cannot visualize TM - start debrox protocol per [facility]    Family/ staff Communication: plan discussed with patient and nurse  Labs/tests ordered:  none

## 2021-08-11 ENCOUNTER — Encounter: Payer: Self-pay | Admitting: Adult Health

## 2021-08-11 ENCOUNTER — Non-Acute Institutional Stay (SKILLED_NURSING_FACILITY): Admitting: Adult Health

## 2021-08-11 DIAGNOSIS — N1832 Chronic kidney disease, stage 3b: Secondary | ICD-10-CM

## 2021-08-11 DIAGNOSIS — G3109 Other frontotemporal dementia: Secondary | ICD-10-CM

## 2021-08-11 DIAGNOSIS — R1312 Dysphagia, oropharyngeal phase: Secondary | ICD-10-CM

## 2021-08-11 DIAGNOSIS — E1149 Type 2 diabetes mellitus with other diabetic neurological complication: Secondary | ICD-10-CM | POA: Diagnosis not present

## 2021-08-11 DIAGNOSIS — I25118 Atherosclerotic heart disease of native coronary artery with other forms of angina pectoris: Secondary | ICD-10-CM

## 2021-08-11 DIAGNOSIS — F0280 Dementia in other diseases classified elsewhere without behavioral disturbance: Secondary | ICD-10-CM

## 2021-08-11 DIAGNOSIS — E1142 Type 2 diabetes mellitus with diabetic polyneuropathy: Secondary | ICD-10-CM

## 2021-08-11 NOTE — Progress Notes (Signed)
Location:   [facility] Room Number: 114 Place of Service:  SNF ([phone]) Provider:   Royal Hawthorn, NP  [name], MD  Patient Care Team: [name], MD as PCP - General (Internal Medicine)  Extended Emergency Contact Information Primary Emergency Contact: Alley,Kelly Address: [address]          [place], [place] [address] [name] of [place] Phone: [phone] Relation: Daughter Secondary Emergency Contact: Harral,[name] Address: [address] [place] [address] [place] of [place] Phone: [phone] Relation: Son  Code Status:  DNR Hospice Goals of care: Advanced Directive information Advanced Directives 08/11/2021  Does Patient Have a Medical Advance Directive? Yes  Type of Paramedic of [place];Out of facility DNR (pink MOST or yellow form)  Does patient want to make changes to medical advance directive? No - Patient declined  Copy of [place] in Chart? Yes - validated most recent copy scanned in chart (See row information)  Would patient like information on creating a medical advance directive? -  Pre-existing out of facility DNR order (yellow form or pink MOST form) Yellow form placed in chart (order not valid for inpatient use);Pink MOST form placed in chart (order not valid for inpatient use)     Chief Complaint  Patient presents with   Medical Management of Chronic Issues   Quality Metric Gaps    PCV    HPI:  Pt is a 81 y.o. male seen today for medical management of chronic diseases.   PMH significant for dementia, DM, neuropathy, dysphagia, HLD, low bp, gout, constipation, CAD with stent, BPH, CKD, and HLD He is followed by hospice for frontal dementia and dysphagia.  No acute complaints. Weight down 3 lbs in the past 3 months. No reported issues chewing or swallowing.  Wt Readings from Last 3 Encounters:  08/11/21 147 lb 11.2 oz (67 kg)   07/12/21 149 lb (67.6 kg)  06/06/21 150 lb 6.4 oz (68.2 kg)  He is on haldol bid due to periods of resistance to care and agitation. This helps but these symptoms are still present.   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 heart failure 04/10/2016   BPH without obstruction/lower urinary tract symptoms 04/05/2016   CKD (chronic kidney disease) stage 3, GFR 30-59 ml/min (HCC) 01/08/2019   Constipation 06/22/2017   Coronary artery disease of native artery of native heart with stable angina pectoris ([place]) 04/10/2016   Dementia ([place])    Diabetes ([place])    Dysphagia 06/12/2019   Essential tremor 09/04/2016   Frontal dementia ([place]) 09/04/2016   Gait abnormality 11/13/2016   Heart disease    Hyperlipidemia    Hyperlipidemia associated with type 2 diabetes mellitus ([place]) 04/05/2016   Hyperuricemia 10/31/2016   Hypotension    Partial retinal detachment of left eye 04/10/2016   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 04/10/2016   Type 2 diabetes mellitus with neurological complications ([place]) 01/25/3663   Past Surgical History:  Procedure Laterality Date   APPENDECTOMY  1961   CORONARY ANGIOPLASTY WITH STENT PLACEMENT  2016   TONSILLECTOMY AND ADENOIDECTOMY  TOTAL KNEE ARTHROPLASTY  2012    Allergies  Allergen Reactions   Lortab [Hydrocodone-Acetaminophen] Other (See Comments)    Reaction:  Affected pts cognition    Morphine And Related Other (See Comments)    Reaction:  Affected pts cognition     Allergies as of 08/11/2021       Reactions   Lortab [hydrocodone-acetaminophen] Other (See Comments)   Reaction:  Affected pts cognition    Morphine And Related Other (See Comments)   Reaction:  Affected pts cognition         Medication List        Accurate as  of August 11, 2021  2:29 PM. If you have any questions, ask your nurse or doctor.          STOP taking these medications    carbamide peroxide 6.5 % OTIC solution Commonly known as: DEBROX Stopped by: Royal Hawthorn, NP       TAKE these medications    acetaminophen 650 MG suppository Commonly known as: TYLENOL Place 650 mg rectally every 4 (four) hours as needed for fever.   acetaminophen 325 MG tablet Commonly known as: TYLENOL Take 650 mg by mouth 3 (three) times daily.   bisacodyl 10 MG suppository Commonly known as: DULCOLAX Place 10 mg rectally daily as needed (constipation).   clotrimazole 1 % cream Commonly known as: Lotrimin AF Apply 1 application topically 2 (two) times daily as needed.   haloperidol 0.5 MG tablet Commonly known as: HALDOL Take 0.5 mg by mouth 2 (two) times daily.   LORazepam 0.5 MG tablet Commonly known as: ATIVAN Take 1 tablet (0.5 mg total) by mouth every 6 (six) hours as needed.   sennosides 8.8 MG/5ML syrup Commonly known as: SENOKOT Take 5 mLs by mouth 2 (two) times daily.        Review of Systems  Unable to perform ROS: Dementia   Immunization History  Administered Date(s) Administered   Influenza, High Dose Seasonal PF 04/17/2019, 04/23/2020, 04/06/2021   Influenza,inj,Quad PF,6+ Mos 04/23/2018   Influenza-Unspecified 05/05/2010, 04/11/2013, 03/03/2015, 09/06/2016, 05/10/2017, 04/27/2020   Moderna SARS-COV2 Booster Vaccination 05/13/2020, 02/24/2021   Moderna Sars-Covid-2 Vaccination 07/08/2019, 08/05/2019, 04/13/2021   Pneumococcal Polysaccharide-23 12/25/2010   Pneumococcal-Unspecified 02/28/2013   Td 06/01/2017   Tdap 03/22/2006   Zoster Recombinat (Shingrix) 05/25/2017, 09/25/2017   Zoster, Live 11/01/2012   Pertinent  Health Maintenance Due  Topic Date Due   INFLUENZA VACCINE  Completed   Fall Risk 10/22/2020 10/23/2020 10/23/2020 10/24/2020 10/24/2020  Falls in the past year? - - - - -  Was there an injury  with Fall? - - - - -  Fall Risk Category Calculator - - - - -  Fall Risk Category - - - - -  Patient Fall Risk Level High fall risk High fall risk High fall risk High fall risk High fall risk  Patient at Risk for Falls Due to - - - - -  Fall risk Follow up - - - - -   Functional Status Survey:    Vitals:   08/11/21 1244  BP: 111/69  Pulse: (!) 55  Resp: 18  Temp: (!) 97 F (36.1 C)  SpO2: 98%  Weight: 147 lb 11.2 oz (67 kg)   Body mass index is 24.58 kg/m. Physical Exam Vitals and nursing note reviewed.  Constitutional:      General: He is not in acute distress.    Appearance: He is not diaphoretic.  HENT:     Head:  Normocephalic and atraumatic.     Nose: Nose normal. No congestion.     Mouth/Throat:     Mouth: Mucous membranes are moist.     Pharynx: Oropharynx is clear.  Eyes:     Conjunctiva/sclera: Conjunctivae normal.     Pupils: Pupils are equal, round, and reactive to light.  Neck:     Thyroid: No thyromegaly.     Vascular: No JVD.     Trachea: No tracheal deviation.  Cardiovascular:     Rate and Rhythm: Normal rate and regular rhythm.     Heart sounds: No murmur heard. Pulmonary:     Effort: Pulmonary effort is normal. No respiratory distress.     Breath sounds: Normal breath sounds. No wheezing.  Abdominal:     General: Bowel sounds are normal. There is no distension.     Palpations: Abdomen is soft.     Tenderness: There is no abdominal tenderness.  Musculoskeletal:     Right lower leg: No edema.     Left lower leg: No edema.  Lymphadenopathy:     Cervical: No cervical adenopathy.  Skin:    General: Skin is warm and dry.  Neurological:     General: No focal deficit present.     Mental Status: He is alert. Mental status is at baseline.     Comments: Non verbal. Not able to f/c  Psychiatric:     Comments: Agitated, trying to craw out of bed.     Labs reviewed: Recent Labs    10/21/20 0207 10/22/20 0446  NA 140 144  K 4.4 4.1  CL 108 112*   CO2 22 22  GLUCOSE 245* 133*  BUN 27* 25*  CREATININE 1.34* 1.20  CALCIUM 8.1* 8.2*  MG 1.9 1.8   Recent Labs    10/21/20 0207 10/22/20 0446  AST 19 19  ALT 12 11  ALKPHOS 120 85  BILITOT 1.1 0.6  PROT 6.5 5.6*  ALBUMIN 3.7 3.1*   Recent Labs    10/21/20 0207 10/22/20 0446  WBC 10.4 9.7  NEUTROABS 9.5*  --   HGB 12.8* 10.4*  HCT 39.8 32.1*  MCV 98.0 97.9  PLT 214 183   Lab Results  Component Value Date   TSH 2.45 01/09/2019   Lab Results  Component Value Date   HGBA1C 7.1 (H) 10/21/2020   Lab Results  Component Value Date   CHOL 124 10/26/2016   HDL 40 10/26/2016   LDLCALC 62 10/26/2016   TRIG 111 10/26/2016    Significant Diagnostic Results in last 30 days:  No results found.  Assessment/Plan 1. 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   2. Type 2 diabetes mellitus with neurological complications ([place]) No longer monitoring due to goals of care.  Has been stable in the past and has not needed meds  3. Diabetic peripheral neuropathy associated with type 2 diabetes mellitus (HCC) No current pain issues, off neurontin   4. Oropharyngeal dysphagia Continue D1 diet  Asp prec 1:1 sypervision   5. Coronary artery disease of native artery of native heart with stable angina pectoris ([place]) Off plavix hospice  6. Stage 3b chronic kidney disease ([place]) Avoid nephrotoxic agents Hydrate when able.

## 2021-09-05 ENCOUNTER — Encounter: Payer: Self-pay | Admitting: Internal Medicine

## 2021-09-05 ENCOUNTER — Non-Acute Institutional Stay (SKILLED_NURSING_FACILITY): Payer: PPO | Admitting: Internal Medicine

## 2021-09-05 DIAGNOSIS — G3109 Other frontotemporal dementia: Secondary | ICD-10-CM | POA: Diagnosis not present

## 2021-09-05 DIAGNOSIS — R1312 Dysphagia, oropharyngeal phase: Secondary | ICD-10-CM | POA: Diagnosis not present

## 2021-09-05 DIAGNOSIS — F0280 Dementia in other diseases classified elsewhere without behavioral disturbance: Secondary | ICD-10-CM | POA: Diagnosis not present

## 2021-09-05 DIAGNOSIS — M1990 Unspecified osteoarthritis, unspecified site: Secondary | ICD-10-CM

## 2021-09-05 NOTE — Progress Notes (Signed)
?Location:   [facility] ?Nursing Home Room Number: 591 ?Place of Service:  SNF (31) ?Provider:  Veleta Miners MD ? ?[name], MD ? ?Patient Care Team: ?[name], MD as PCP - General (Internal Medicine) ? ?Extended Emergency Contact Information ?Primary Emergency Contact: Alley,Kelly ?Address: [address]         [place], [place] [address] [place] ?Home Phone: [phone] ?Relation: Daughter ?Secondary Emergency Contact: Betsch,[name] ?Address: [place] [address] [place] ?Home Phone: [phone] ?Relation: Son ? ?Code Status:  DNR Hospice ?Goals of care: Advanced Directive information ?Advanced Directives 09/05/2021  ?Does Patient Have a Medical Advance Directive? Yes  ?Type of Paramedic of [place];Out of facility DNR (pink MOST or yellow form)  ?Does patient want to make changes to medical advance directive? No - Patient declined  ?Copy of [place] in Chart? Yes - validated most recent copy scanned in chart (See row information)  ?Would patient like information on creating a medical advance directive? -  ?Pre-existing out of facility DNR order (yellow form or pink MOST form) Yellow form placed in chart (order not valid for inpatient use);Pink MOST form placed in chart (order not valid for inpatient use)  ? ? ? ?Chief Complaint  ?Patient presents with  ? Medical Management of Chronic Issues  ? ? ?HPI:  ?Pt is a 81 y.o. male seen today for medical management of chronic diseases.   ? ?Patient with End Stage Dementia, Dysphagia, Recurent UTI, CKD ?CAD s/p Stent Placement ?Now Under Hospice care ? ? He is stable. No new Nursing issues. No Behavior issues ?His weight lost some since last visit ?Hoyer Lift Dependent ?Needs help with feeding ?No Falls ?Wt Readings from Last 3 Encounters:  ?09/05/21 146 lb 6.4 oz (66.4 kg)  ?08/11/21 147 lb 11.2 oz (67 kg)  ?07/12/21 149 lb (67.6 kg)   ? ?Past Medical History:  ?Diagnosis Date  ? Benign hypertensive heart disease without heart failure 04/10/2016  ? BPH without obstruction/lower urinary tract symptoms 04/05/2016  ? CKD (chronic kidney disease) stage 3, GFR 30-59 ml/min (HCC) 01/08/2019  ? Constipation 06/22/2017  ? Coronary artery disease of native artery of native heart with stable angina pectoris ([place]) 04/10/2016  ? Dementia ([place])   ? Diabetes ([place])   ? Dysphagia 06/12/2019  ? Essential tremor 09/04/2016  ? 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 Gait abnormality 11/13/2016  ? Heart disease   ? Hyperlipidemia   ? Hyperlipidemia associated with type 2 diabetes mellitus ([place]) 04/05/2016  ? Hyperuricemia 10/31/2016  ? Hypotension   ? Partial retinal detachment of left eye 04/10/2016  ? 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 Type 2 diabetes mellitus with neurological complications ([facility]) 12/23/6331  ? ?Past Surgical History:  ?Procedure Laterality Date  ? APPENDECTOMY  1961  ? CORONARY ANGIOPLASTY WITH STENT PLACEMENT  2016  ? TONSILLECTOMY AND ADENOIDECTOMY    ? TOTAL KNEE ARTHROPLASTY  2012  ? ? ?Allergies  ?Allergen Reactions  ? Lortab [Hydrocodone-Acetaminophen] Other (See Comments)  ?  Reaction:  Affected pts cognition   ? Morphine And Related Other (See Comments)  ?  Reaction:  Affected pts cognition   ? ? ?Allergies as of 09/05/2021   ? ?   Reactions  ? Lortab [hydrocodone-acetaminophen] Other (See Comments)  ? Reaction:  Affected pts cognition   ? Morphine  And Related Other (See Comments)  ? Reaction:  Affected pts cognition   ? ?  ? ?  ?Medication List  ?  ? ?  ? Accurate as of September 05, 2021 11:29 AM. If you have any questions, ask your nurse or doctor.  ?  ?  ? ?  ? ?acetaminophen 650 MG suppository ?Commonly known as: TYLENOL ?Place 650 mg rectally every 4 (four) hours as needed for fever. ?  ?acetaminophen 325 MG tablet ?Commonly known as: TYLENOL ?Take 650 mg by mouth 3 (three) times daily. ?  ?bisacodyl 10 MG suppository ?Commonly known as:  DULCOLAX ?Place 10 mg rectally daily as needed (constipation). ?  ?clotrimazole 1 % cream ?Commonly known as: Lotrimin AF ?Apply 1 application topically 2 (two) times daily as needed. ?  ?haloperidol 0.5 MG tablet ?Commonly known as: HALDOL ?Take 0.5 mg by mouth 2 (two) times daily. ?  ?LORazepam 0.5 MG tablet ?Commonly known as: ATIVAN ?Take 1 tablet (0.5 mg total) by mouth every 6 (six) hours as needed. ?  ?sennosides 8.8 MG/5ML syrup ?Commonly known as: SENOKOT ?Take 5 mLs by mouth 2 (two) times daily. ?  ? ?  ? ? ?Review of Systems  ?Unable to perform ROS: Dementia  ? ?Immunization History  ?Administered Date(s) Administered  ? Influenza, High Dose Seasonal PF 04/17/2019, 04/23/2020, 04/06/2021  ? Influenza,inj,Quad PF,6+ Mos 04/23/2018  ? Influenza-Unspecified 05/05/2010, 04/11/2013, 03/03/2015, 09/06/2016, 05/10/2017, 04/27/2020  ? Moderna SARS-COV2 Booster Vaccination 05/13/2020, 02/24/2021  ? Moderna Sars-Covid-2 Vaccination 07/08/2019, 08/05/2019, 04/13/2021  ? Pneumococcal Polysaccharide-23 12/25/2010  ? Pneumococcal-Unspecified 02/28/2013  ? Td 06/01/2017  ? Tdap 03/22/2006  ? Zoster Recombinat (Shingrix) 05/25/2017, 09/25/2017  ? Zoster, Live 11/01/2012  ? ?Pertinent  Health Maintenance Due  ?Topic Date Due  ? INFLUENZA VACCINE  Completed  ? ?Fall Risk 10/22/2020 10/23/2020 10/23/2020 10/24/2020 10/24/2020  ?Falls in the past year? - - - - -  ?Was there an injury with Fall? - - - - -  ?Fall Risk Category Calculator - - - - -  ?Fall Risk Category - - - - -  ?Patient Fall Risk Level High fall risk High fall risk High fall risk High fall risk High fall risk  ?Patient at Risk for Falls Due to - - - - -  ?Fall risk Follow up - - - - -  ? ?Functional Status Survey: ?  ? ?Vitals:  ? 09/05/21 1125  ?BP: 108/79  ?Pulse: 75  ?Resp: 17  ?Temp: (!) 97.5 ?F (36.4 ?C)  ?SpO2: 95%  ?Weight: 146 lb 6.4 oz (66.4 kg)  ?Height: '5\' 5"'$  (1.651 m)  ? ?Body mass index is 24.36 kg/m?Marland Kitchen ?Physical Exam ?Vitals reviewed.  ?HENT:  ?    Head: Normocephalic.  ?   Mouth/Throat:  ?   Mouth: Mucous membranes are moist.  ?   Pharynx: Oropharynx is clear.  ?Eyes:  ?   Pupils: Pupils are equal, round, and reactive to light.  ?Cardiovascular:  ?   Rate and Rhythm: Normal rate and regular rhythm.  ?   Pulses: Normal pulses.  ?   Heart sounds: No murmur heard. ?Pulmonary:  ?   Effort: Pulmonary effort is normal. No respiratory distress.  ?   Breath sounds: Normal breath sounds. No rales.  ?Abdominal:  ?   General: Abdomen is flat. Bowel sounds are normal.  ?   Palpations: Abdomen is soft.  ?Musculoskeletal:     ?   General: No swelling.  ?   Cervical back:  Neck supple.  ?Skin: ?   General: Skin is warm.  ?Neurological:  ?   Mental Status: He is alert.  ?   Comments: Has Aphasia Does not follow any commands  ?Psychiatric:     ?   Mood and Affect: Mood normal.     ?   Thought Content: Thought content normal.  ? ? ?Labs reviewed: ?Recent Labs  ?  10/21/20 ?0207 10/22/20 ?0446  ?NA 140 144  ?K 4.4 4.1  ?CL 108 112*  ?CO2 22 22  ?GLUCOSE 245* 133*  ?BUN 27* 25*  ?CREATININE 1.34* 1.20  ?CALCIUM 8.1* 8.2*  ?MG 1.9 1.8  ? ?Recent Labs  ?  10/21/20 ?0207 10/22/20 ?3762  ?AST 19 19  ?ALT 12 11  ?ALKPHOS 120 85  ?BILITOT 1.1 0.6  ?PROT 6.5 5.6*  ?ALBUMIN 3.7 3.1*  ? ?Recent Labs  ?  10/21/20 ?0207 10/22/20 ?8315  ?WBC 10.4 9.7  ?NEUTROABS 9.5*  --   ?HGB 12.8* 10.4*  ?HCT 39.8 32.1*  ?MCV 98.0 97.9  ?PLT 214 183  ? ?Lab Results  ?Component Value Date  ? TSH 2.45 01/09/2019  ? ?Lab Results  ?Component Value Date  ? HGBA1C 7.1 (H) 10/21/2020  ? ?Lab Results  ?Component Value Date  ? CHOL 124 10/26/2016  ? HDL 40 10/26/2016  ? [place] 62 10/26/2016  ? TRIG 111 10/26/2016  ? ? ?Significant Diagnostic Results in last 30 days:  ?No results found. ? ?Assessment/Plan ? ?1. Oropharyngeal dysphagia ?On D1  ?Some weight loss ?Enrolled in Hospice ? 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 ?3. Arthritis ?Tylenol Prn ? ? ? ?Family/ staff  Communication:  ? ?Labs/tests ordered:   ? ?  ?

## 2021-10-06 ENCOUNTER — Non-Acute Institutional Stay (SKILLED_NURSING_FACILITY): Payer: PPO | Admitting: Adult Health

## 2021-10-06 ENCOUNTER — Encounter: Payer: Self-pay | Admitting: Adult Health

## 2021-10-06 DIAGNOSIS — M170 Bilateral primary osteoarthritis of knee: Secondary | ICD-10-CM | POA: Diagnosis not present

## 2021-10-06 DIAGNOSIS — E1149 Type 2 diabetes mellitus with other diabetic neurological complication: Secondary | ICD-10-CM

## 2021-10-06 DIAGNOSIS — N1832 Chronic kidney disease, stage 3b: Secondary | ICD-10-CM

## 2021-10-06 DIAGNOSIS — F0280 Dementia in other diseases classified elsewhere without behavioral disturbance: Secondary | ICD-10-CM

## 2021-10-06 DIAGNOSIS — E790 Hyperuricemia without signs of inflammatory arthritis and tophaceous disease: Secondary | ICD-10-CM

## 2021-10-06 DIAGNOSIS — R1312 Dysphagia, oropharyngeal phase: Secondary | ICD-10-CM

## 2021-10-06 DIAGNOSIS — G3109 Other frontotemporal dementia: Secondary | ICD-10-CM

## 2021-10-06 NOTE — Assessment & Plan Note (Signed)
End stage ?Followed by hospice ?Continue haldol and ativan for agitation ?Continue tylenol tid for perceived pain.  ?

## 2021-10-06 NOTE — Assessment & Plan Note (Signed)
Continue scheduled tylenol  ?

## 2021-10-06 NOTE — Assessment & Plan Note (Signed)
Has not had any issues even though he is off allopurinol  ?

## 2021-10-06 NOTE — Assessment & Plan Note (Signed)
Noted, avoid nephrotoxic agents ?Avoid aggressive monitoring due to goals of care ?

## 2021-10-06 NOTE — Assessment & Plan Note (Signed)
Controlled with senokot.  

## 2021-10-06 NOTE — Assessment & Plan Note (Signed)
Continue D1 diet with NTL ?Asp prec ?1:1 supervision  ?

## 2021-10-06 NOTE — Progress Notes (Signed)
?Location:  [facility] ?Nursing Home Room Number: 114-A ?Place of Service:  SNF (31) ?Provider:  Royal Hawthorn, NP  ? ?Patient Care Team: ?[name], MD as PCP - General (Internal Medicine) ? ?Extended Emergency Contact Information ?Primary Emergency Contact: Alley,Kelly ?Address: [address]         [place], [place] [address] [place] ?Home Phone: [phone] ?Relation: Daughter ?Secondary Emergency Contact: Mcclurkin,[name] ?Address: [address] [place] [address] [place] ?Home Phone: [phone] ?Relation: Son ? ?Code Status:  DNR ?Goals of care: Advanced Directive information ? ?  10/06/2021  ?  1:50 PM  ?Advanced Directives  ?Does Patient Have a Medical Advance Directive? Yes  ?Type of Paramedic of [place];Out of facility DNR (pink MOST or yellow form)  ?Does patient want to make changes to medical advance directive? No - Patient declined  ?Copy of [place] in Chart? Yes - validated most recent copy scanned in chart (See row information)  ?Pre-existing out of facility DNR order (yellow form or pink MOST form) Yellow form placed in chart (order not valid for inpatient use);Pink MOST form placed in chart (order not valid for inpatient use)  ? ? ? ?Chief Complaint  ?Patient presents with  ? Medical Management of Chronic Issues  ?  Routine visit   ? ? ?HPI:  ?Pt is a 81 y.o. male seen today for medical management of chronic diseases.   ?PMH significant for dementia, DM, neuropathy, dysphagia, HLD, low bp, gout, constipation, CAD with stent, BPH, CKD, and HLD ?He is followed by hospice for frontal dementia and dysphagia.  ?Continues with agitation during personal care on haldol bid ?Takes tylenol for perceived pain due to hx of arthritis ?Bowels moving on senokot ?Sat entered at 82% pt in no acute distress, nurse rechecking.  ?Bp range 580-998 systolic.  ?No acute issues ?Wt Readings from Last 3  Encounters:  ?10/06/21 146 lb 6.4 oz (66.4 kg)  ?09/05/21 146 lb 6.4 oz (66.4 kg)  ?08/11/21 147 lb 11.2 oz (67 kg)  ? ?Currently on puree diet with NTL tolerating well.  ? ? ?Past Medical History:  ?Diagnosis Date  ? Benign hypertensive heart disease without heart failure 04/10/2016  ? BPH without obstruction/lower urinary tract symptoms 04/05/2016  ? CKD (chronic kidney disease) stage 3, GFR 30-59 ml/min (HCC) 01/08/2019  ? Constipation 06/22/2017  ? Coronary artery disease of native artery of native heart with stable angina pectoris ([place]) 04/10/2016  ? Dementia ([place])   ? Diabetes ([facility])   ? Dysphagia 06/12/2019  ? Essential tremor 09/04/2016  ? Frontal dementia ([facility]) 09/04/2016  ? Gait abnormality 11/13/2016  ? Heart disease   ? Hyperlipidemia   ? Hyperlipidemia associated with type 2 diabetes mellitus ([facility]) 04/05/2016  ? Hyperuricemia 10/31/2016  ? Hypotension   ? Partial retinal detachment of left eye 04/10/2016  ? 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 Type 2 diabetes mellitus with neurological complications ([place]) 3/38/2505  ? ?Past Surgical History:  ?Procedure Laterality Date  ? APPENDECTOMY  1961  ? CORONARY ANGIOPLASTY WITH STENT PLACEMENT  2016  ? TONSILLECTOMY AND ADENOIDECTOMY    ? TOTAL KNEE ARTHROPLASTY  2012  ? ? ?Allergies  ?Allergen Reactions  ? Lortab [Hydrocodone-Acetaminophen] Other (See Comments)  ?  Reaction:  Affected pts cognition   ? Morphine And Related Other (See Comments)  ?  Reaction:  Affected pts cognition   ? ? ?Outpatient Encounter Medications as of 10/06/2021  ?Medication Sig  ? acetaminophen (TYLENOL) 325 MG tablet Take 650 mg by mouth 3 (three) times daily.  ? acetaminophen (TYLENOL) 650 MG suppository Place 650 mg rectally every 4 (four) hours as needed for fever.  ? bisacodyl (DULCOLAX) 10 MG suppository Place 10 mg rectally daily as needed (constipation).   ? clotrimazole (LOTRIMIN AF) 1 % cream Apply 1 application topically 2 (two) times daily as needed.  ? haloperidol (HALDOL)  0.5 MG tablet Take 0.5 mg by mouth 2 (two) times daily.  ? LORazepam (ATIVAN) 0.5 MG tablet Take 1 tablet (0.5 mg total) by mouth every 6 (six) hours as needed.  ? sennosides (SENOKOT) 8.8 MG/5ML syrup Take 5 mLs by mouth 2 (two) times daily.  ? ?No facility-administered encounter medications on file as of 10/06/2021.  ? ? ?Review of Systems  ?Unable to perform ROS: Dementia  ? ?Immunization History  ?Administered Date(s) Administered  ? Influenza, High Dose Seasonal PF 04/17/2019, 04/23/2020, 04/06/2021  ? Influenza,inj,Quad PF,6+ Mos 04/23/2018  ? Influenza-Unspecified 05/05/2010, 04/11/2013, 03/03/2015, 09/06/2016, 05/10/2017, 04/27/2020  ? Moderna SARS-COV2 Booster Vaccination 05/13/2020, 02/24/2021  ? Moderna Sars-Covid-2 Vaccination 07/08/2019, 08/05/2019, 04/13/2021  ? Pneumococcal Polysaccharide-23 12/25/2010  ? Pneumococcal-Unspecified 02/28/2013  ? Td 06/01/2017  ? Tdap 03/22/2006  ? Zoster Recombinat (Shingrix) 05/25/2017, 09/25/2017  ? Zoster, Live 11/01/2012  ? ?Pertinent  Health Maintenance Due  ?Topic Date Due  ? INFLUENZA VACCINE  01/31/2022  ? ? ?  10/22/2020  ?  8:00 PM 10/23/2020  ?  8:00 AM 10/23/2020  ?  8:00 PM 10/24/2020  ?  9:45 AM 10/24/2020  ? 11:00 PM  ?Fall Risk  ?Patient Fall Risk Level High fall risk High fall risk High fall risk High fall risk High fall risk  ? ?Functional Status Survey: ?  ? ?Vitals:  ? 10/06/21 1232  ?BP: 110/70  ?Pulse: (!) 51  ?Resp: 14  ?Temp: (!) 97.1 ?F (36.2 ?C)  ?SpO2: (!) 82%  ?Weight: 146 lb 6.4 oz (66.4 kg)  ?Height: '5\' 5"'$  (1.651 m)  ? ?Body mass index is 24.36 kg/m?Marland Kitchen ?Physical Exam ?Vitals and nursing note reviewed.  ?Constitutional:   ?   General: He is not in acute distress. ?   Appearance: He is not diaphoretic.  ?HENT:  ?   Head: Normocephalic and atraumatic.  ?   Mouth/Throat:  ?   Mouth: Mucous membranes are moist.  ?   Pharynx: Oropharynx is clear.  ?Neck:  ?   Thyroid: No thyromegaly.  ?   Vascular: No JVD.  ?   Trachea: No tracheal deviation.   ?Cardiovascular:  ?   Rate and Rhythm: Regular rhythm. Bradycardia present.  ?   Heart sounds: No murmur heard. ?Pulmonary:  ?   Effort: Pulmonary effort is normal. No respiratory distress.  ?   Breath sounds: Normal breath sounds. No wheezing.  ?Abdominal:  ?   General: Bowel sounds are normal. There is no distension.  ?   Palpations: Abdomen is soft.  ?   Tenderness: There is no abdominal tenderness.  ?Musculoskeletal:  ?   Right lower leg: No edema.  ?   Left lower leg: No edema.  ?   Comments: Atrophy to gastoc muscles  ?Lymphadenopathy:  ?   Cervical: No cervical adenopathy.  ?Skin: ?   General: Skin is warm and dry.  ?Neurological:  ?   General: No focal deficit present.  ?  Mental Status: He is alert. Mental status is at baseline.  ? ? ?Labs reviewed: ?Recent Labs  ?  10/21/20 ?0207 10/22/20 ?0446  ?NA 140 144  ?K 4.4 4.1  ?CL 108 112*  ?CO2 22 22  ?GLUCOSE 245* 133*  ?BUN 27* 25*  ?CREATININE 1.34* 1.20  ?CALCIUM 8.1* 8.2*  ?MG 1.9 1.8  ? ?Recent Labs  ?  10/21/20 ?0207 10/22/20 ?3710  ?AST 19 19  ?ALT 12 11  ?ALKPHOS 120 85  ?BILITOT 1.1 0.6  ?PROT 6.5 5.6*  ?ALBUMIN 3.7 3.1*  ? ?Recent Labs  ?  10/21/20 ?0207 10/22/20 ?6269  ?WBC 10.4 9.7  ?NEUTROABS 9.5*  --   ?HGB 12.8* 10.4*  ?HCT 39.8 32.1*  ?MCV 98.0 97.9  ?PLT 214 183  ? ?Lab Results  ?Component Value Date  ? TSH 2.45 01/09/2019  ? ?Lab Results  ?Component Value Date  ? HGBA1C 7.1 (H) 10/21/2020  ? ?Lab Results  ?Component Value Date  ? CHOL 124 10/26/2016  ? HDL 40 10/26/2016  ? [place] 62 10/26/2016  ? TRIG 111 10/26/2016  ? ? ?Significant Diagnostic Results in last 30 days:  ?No results found. ? ?Assessment/Plan ? 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  ? ?Type 2 diabetes mellitus with neurological complications ([place]) ?Not currently on meds, hospice care only ?Has been diet controlled in the past ? ?CKD (chronic kidney disease) stage 3, GFR 30-59  ml/min (HCC) ?Noted, avoid nephrotoxic agents ?Avoid aggressive monitoring due to goals of care ? ?Bilateral primary osteoarthritis of knee ?Continue scheduled tylenol  ? ?Dysphagia ?Continue D1 diet with NTL ?As

## 2021-10-06 NOTE — Assessment & Plan Note (Signed)
Not currently on meds, hospice care only ?Has been diet controlled in the past ?

## 2021-10-18 DIAGNOSIS — M79672 Pain in left foot: Secondary | ICD-10-CM | POA: Diagnosis not present

## 2021-10-18 DIAGNOSIS — M79671 Pain in right foot: Secondary | ICD-10-CM | POA: Diagnosis not present

## 2021-10-18 DIAGNOSIS — E119 Type 2 diabetes mellitus without complications: Secondary | ICD-10-CM | POA: Diagnosis not present

## 2021-10-18 DIAGNOSIS — L602 Onychogryphosis: Secondary | ICD-10-CM | POA: Diagnosis not present

## 2021-11-07 ENCOUNTER — Non-Acute Institutional Stay (SKILLED_NURSING_FACILITY): Payer: PPO | Admitting: Adult Health

## 2021-11-07 DIAGNOSIS — N1832 Chronic kidney disease, stage 3b: Secondary | ICD-10-CM | POA: Diagnosis not present

## 2021-11-07 DIAGNOSIS — K5901 Slow transit constipation: Secondary | ICD-10-CM | POA: Diagnosis not present

## 2021-11-07 DIAGNOSIS — N400 Enlarged prostate without lower urinary tract symptoms: Secondary | ICD-10-CM | POA: Diagnosis not present

## 2021-11-07 DIAGNOSIS — M170 Bilateral primary osteoarthritis of knee: Secondary | ICD-10-CM

## 2021-11-07 DIAGNOSIS — F0280 Dementia in other diseases classified elsewhere without behavioral disturbance: Secondary | ICD-10-CM | POA: Diagnosis not present

## 2021-11-07 DIAGNOSIS — G3109 Other frontotemporal dementia: Secondary | ICD-10-CM

## 2021-11-07 DIAGNOSIS — R1312 Dysphagia, oropharyngeal phase: Secondary | ICD-10-CM

## 2021-11-10 ENCOUNTER — Encounter: Payer: Self-pay | Admitting: Adult Health

## 2021-11-10 NOTE — Progress Notes (Signed)
?Location:  [facility] ?  ?Place of Service:  SNF (31) ?Provider:  Royal Hawthorn, NP  ? ?Patient Care Team: ?[name], MD as PCP - General (Internal Medicine) ? ?Extended Emergency Contact Information ?Primary Emergency Contact: Alley,Kelly ?Address: [address]         [place], [place] [address] [place] ?Home Phone: [phone] ?Relation: Daughter ?Secondary Emergency Contact: Lauf,[name] ?Address: [address] ?         [place] [address] [place] ?Home Phone: [phone] ?Relation: Son ? ?Code Status:  DNR ?Goals of care: Advanced Directive information ? ?  10/06/2021  ?  1:50 PM  ?Advanced Directives  ?Does Patient Have a Medical Advance Directive? Yes  ?Type of Paramedic of [place];Out of facility DNR (pink MOST or yellow form)  ?Does patient want to make changes to medical advance directive? No - Patient declined  ?Copy of [place] in Chart? Yes - validated most recent copy scanned in chart (See row information)  ?Pre-existing out of facility DNR order (yellow form or pink MOST form) Yellow form placed in chart (order not valid for inpatient use);Pink MOST form placed in chart (order not valid for inpatient use)  ? ? ? ?Chief Complaint  ?Patient presents with  ? Medical Management of Chronic Issues  ? ? ?HPI:  ?Pt is a 81 y.o. male seen today for medical management of chronic diseases.   ?PMH significant for dementia, DM, neuropathy, dysphagia, HLD, low bp, gout, constipation, CAD with stent, BPH, CKD, and HLD ? ?Followed by hospice.  ? ?No weight change.  ? ?Continues on tylenol for perceived arthritic pain, no issues reported with pain control ? ?Continues to have some resistance to personal care. Calm when at the desk.  ? ?BP runs in the 90s at times ? ?HR runs 50-60 range.  ? ?Wt Readings from Last 3 Encounters:  ?11/10/21 146 lb 6.4 oz (66.4 kg)  ?10/06/21 146 lb 6.4 oz (66.4 kg)  ?09/05/21  146 lb 6.4 oz (66.4 kg)  ? ?Currently on puree diet with NTL tolerating well.  ? ? ?Past Medical History:  ?Diagnosis Date  ? Benign hypertensive heart disease without heart failure 04/10/2016  ? BPH without obstruction/lower urinary tract symptoms 04/05/2016  ? CKD (chronic kidney disease) stage 3, GFR 30-59 ml/min (HCC) 01/08/2019  ? Constipation 06/22/2017  ? Coronary artery disease of native artery of native heart with stable angina pectoris ([place]) 04/10/2016  ? Dementia ([place])   ? Diabetes ([place])   ? Dysphagia 06/12/2019  ? Essential tremor 09/04/2016  ? 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 Gait abnormality 11/13/2016  ? Heart disease   ? Hyperlipidemia   ? Hyperlipidemia associated with type 2 diabetes mellitus ([place]) 04/05/2016  ? Hyperuricemia 10/31/2016  ? Hypotension   ? Partial retinal detachment of left eye 04/10/2016  ? 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 Type 2 diabetes mellitus with neurological complications ([place]) 1/95/0932  ? ?Past Surgical History:  ?Procedure Laterality Date  ? APPENDECTOMY  1961  ? CORONARY ANGIOPLASTY WITH STENT PLACEMENT  2016  ? TONSILLECTOMY AND ADENOIDECTOMY    ? TOTAL KNEE ARTHROPLASTY  2012  ? ? ?Allergies  ?Allergen Reactions  ? Lortab [Hydrocodone-Acetaminophen] Other (See Comments)  ?  Reaction:  Affected pts cognition   ? Morphine And Related Other (See Comments)  ?  Reaction:  Affected pts cognition   ? ? ?Outpatient  Encounter Medications as of 11/07/2021  ?Medication Sig  ? acetaminophen (TYLENOL) 325 MG tablet Take 650 mg by mouth 3 (three) times daily.  ? acetaminophen (TYLENOL) 650 MG suppository Place 650 mg rectally every 4 (four) hours as needed for fever.  ? bisacodyl (DULCOLAX) 10 MG suppository Place 10 mg rectally daily as needed (constipation).   ? clotrimazole (LOTRIMIN AF) 1 % cream Apply 1 application topically 2 (two) times daily as needed.  ? haloperidol (HALDOL) 0.5 MG tablet Take 0.5 mg by mouth 2 (two) times daily.  ? LORazepam (ATIVAN) 0.5 MG tablet  Take 1 tablet (0.5 mg total) by mouth every 6 (six) hours as needed.  ? sennosides (SENOKOT) 8.8 MG/5ML syrup Take 5 mLs by mouth 2 (two) times daily.  ? ?No facility-administered encounter medications on file as of 11/07/2021.  ? ? ?Review of Systems  ?Unable to perform ROS: Dementia  ? ?Immunization History  ?Administered Date(s) Administered  ? Influenza, High Dose Seasonal PF 04/17/2019, 04/23/2020, 04/06/2021  ? Influenza,inj,Quad PF,6+ Mos 04/23/2018  ? Influenza-Unspecified 05/05/2010, 04/11/2013, 03/03/2015, 09/06/2016, 05/10/2017, 04/27/2020  ? Moderna SARS-COV2 Booster Vaccination 05/13/2020, 02/24/2021  ? Moderna Sars-Covid-2 Vaccination 07/08/2019, 08/05/2019, 04/13/2021  ? Pneumococcal Polysaccharide-23 12/25/2010  ? Pneumococcal-Unspecified 02/28/2013  ? Td 06/01/2017  ? Tdap 03/22/2006  ? Zoster Recombinat (Shingrix) 05/25/2017, 09/25/2017  ? Zoster, Live 11/01/2012  ? ?Pertinent  Health Maintenance Due  ?Topic Date Due  ? INFLUENZA VACCINE  01/31/2022  ? ? ?  10/22/2020  ?  8:00 PM 10/23/2020  ?  8:00 AM 10/23/2020  ?  8:00 PM 10/24/2020  ?  9:45 AM 10/24/2020  ? 11:00 PM  ?Fall Risk  ?Patient Fall Risk Level High fall risk High fall risk High fall risk High fall risk High fall risk  ? ?Functional Status Survey: ?  ? ?Vitals:  ? 11/10/21 0948  ?Weight: 146 lb 6.4 oz (66.4 kg)  ? ?Body mass index is 24.36 kg/m?Marland Kitchen ?Physical Exam ?Vitals and nursing note reviewed.  ?Constitutional:   ?   General: He is not in acute distress. ?   Appearance: He is not diaphoretic.  ?HENT:  ?   Head: Normocephalic and atraumatic.  ?   Mouth/Throat:  ?   Mouth: Mucous membranes are moist.  ?   Pharynx: Oropharynx is clear.  ?Neck:  ?   Thyroid: No thyromegaly.  ?   Vascular: No JVD.  ?   Trachea: No tracheal deviation.  ?Cardiovascular:  ?   Rate and Rhythm: Regular rhythm. Bradycardia present.  ?   Heart sounds: No murmur heard. ?Pulmonary:  ?   Effort: Pulmonary effort is normal. No respiratory distress.  ?   Breath sounds:  Normal breath sounds. No wheezing.  ?Abdominal:  ?   General: Bowel sounds are normal. There is no distension.  ?   Palpations: Abdomen is soft.  ?   Tenderness: There is no abdominal tenderness.  ?Musculoskeletal:  ?   Right lower leg: No edema.  ?   Left lower leg: No edema.  ?   Comments: Atrophy to gastoc muscles  ?Lymphadenopathy:  ?   Cervical: No cervical adenopathy.  ?Skin: ?   General: Skin is warm and dry.  ?Neurological:  ?   General: No focal deficit present.  ?   Mental Status: He is alert. Mental status is at baseline.  ? ? ?Labs reviewed: ?No results for input(s): NA, K, CL, CO2, GLUCOSE, BUN, CREATININE, CALCIUM, MG, PHOS in the last 8760 hours. ? ?  No results for input(s): AST, ALT, ALKPHOS, BILITOT, PROT, ALBUMIN in the last 8760 hours. ? ?No results for input(s): WBC, NEUTROABS, HGB, HCT, MCV, PLT in the last 8760 hours. ? ?Lab Results  ?Component Value Date  ? TSH 2.45 01/09/2019  ? ?Lab Results  ?Component Value Date  ? HGBA1C 7.1 (H) 10/21/2020  ? ?Lab Results  ?Component Value Date  ? CHOL 124 10/26/2016  ? HDL 40 10/26/2016  ? [place] 62 10/26/2016  ? TRIG 111 10/26/2016  ? ? ?Significant Diagnostic Results in last 30 days:  ?No results found. ? ?Assessment/Plan ? ?1. Oropharyngeal dysphagia ?Continue D1 diet ?Asp prec ?DNR with most form no feeding tubes.  ? 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 ?3. Bilateral primary osteoarthritis of knee ?Pain controlled with tylenol ? ?4. BPH without obstruction/lower urinary tract symptoms ?No current issues ?Incontinent.  ? ?5. Stage 3b chronic kidney disease ([place]) ?avoid nephrotoxic agents ?Avoiding frequent labs due to hospice status ? ?6. Slow transit constipation ?Continue senna ? ?No labs due to goals of care ? ? ? ?

## 2021-12-12 ENCOUNTER — Non-Acute Institutional Stay: Payer: PPO | Admitting: Internal Medicine

## 2021-12-12 ENCOUNTER — Encounter: Payer: Self-pay | Admitting: Internal Medicine

## 2021-12-12 DIAGNOSIS — G3109 Other frontotemporal dementia: Secondary | ICD-10-CM

## 2021-12-12 DIAGNOSIS — R1312 Dysphagia, oropharyngeal phase: Secondary | ICD-10-CM

## 2021-12-12 DIAGNOSIS — F0280 Dementia in other diseases classified elsewhere without behavioral disturbance: Secondary | ICD-10-CM

## 2021-12-12 DIAGNOSIS — M170 Bilateral primary osteoarthritis of knee: Secondary | ICD-10-CM | POA: Diagnosis not present

## 2021-12-12 NOTE — Progress Notes (Signed)
Location:    [facility] Room Number: [place] of Service:  SNF [phone]) Provider:  Veleta Miners MD  [name], MD  Patient Care Team: [name], MD as PCP - General (Internal Medicine)  Extended Emergency Contact Information Primary Emergency Contact: Alley,Kelly Address: [address]          [place], [place] [address] [name] Phone: [phone] Relation: Daughter Secondary Emergency Contact: Rogacki,[name] Address: [address] [place] [address] [place] Phone: [phone] Relation: Son  Code Status:  DNR Hospice Goals of care: Advanced Directive information    12/12/2021    2:36 PM  Advanced Directives  Does Patient Have a Medical Advance Directive? Yes  Type of Paramedic of [place];Out of facility DNR (pink MOST or yellow form)  Does patient want to make changes to medical advance directive? No - Patient declined  Copy of [place] in Chart? Yes - validated most recent copy scanned in chart (See row information)  Pre-existing out of facility DNR order (yellow form or pink MOST form) Yellow form placed in chart (order not valid for inpatient use);Pink MOST form placed in chart (order not valid for inpatient use)     Chief Complaint  Patient presents with   Medical Management of Chronic Issues    HPI:  Pt is a 81 y.o. male seen today for medical management of chronic diseases.    Patient with End Stage Dementia, Dysphagia, Recurent UTI, CKD CAD s/p Stent Placement Now Under Hospice care     He is stable. No new Nursing issues. No Behavior issues He did have cough for last few days But no SOB or chest pain or Fever Patient is total Care with [facility] lift and needs help with feeding Eats most of his meal No Falls Wt Readings from Last 3 Encounters:  12/12/21 150 lb 3.2 oz (68.1 kg)  11/10/21 146 lb 6.4 oz (66.4 kg)   10/06/21 146 lb 6.4 oz (66.4 kg)   Past Medical History:  Diagnosis Date   Benign hypertensive heart disease without heart failure 04/10/2016   BPH without obstruction/lower urinary tract symptoms 04/05/2016   CKD (chronic kidney disease) stage 3, GFR 30-59 ml/min (HCC) 01/08/2019   Constipation 06/22/2017   Coronary artery disease of native artery of native heart with stable angina pectoris ([place]) 04/10/2016   Dementia ([place])    Diabetes ([place])    Dysphagia 06/12/2019   Essential tremor 09/04/2016   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   Gait abnormality 11/13/2016   Heart disease    Hyperlipidemia    Hyperlipidemia associated with type 2 diabetes mellitus ([place]) 04/05/2016   Hyperuricemia 10/31/2016   Hypotension    Partial retinal detachment of left eye 04/10/2016   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 04/10/2016   Type 2 diabetes mellitus with neurological complications ([place]) 9/56/3875   Past Surgical History:  Procedure Laterality Date   APPENDECTOMY  1961   CORONARY ANGIOPLASTY WITH STENT PLACEMENT  2016   TONSILLECTOMY AND ADENOIDECTOMY     TOTAL KNEE ARTHROPLASTY  2012    Allergies  Allergen Reactions   Lortab [Hydrocodone-Acetaminophen] Other (See Comments)    Reaction:  Affected pts cognition    Morphine And Related Other (See Comments)    Reaction:  Affected pts cognition     Allergies as of 12/12/2021  Reactions   Lortab [hydrocodone-acetaminophen] Other (See Comments)   Reaction:  Affected pts cognition    Morphine And Related Other (See Comments)   Reaction:  Affected pts cognition         Medication List        Accurate as of December 12, 2021  2:37 PM. If you have any questions, ask your nurse or doctor.          acetaminophen 650 MG suppository Commonly known as: TYLENOL Place 650 mg rectally every 4 (four) hours as needed for fever.   acetaminophen 325 MG tablet Commonly known as: TYLENOL Take 650 mg by mouth 3 (three) times daily.   bisacodyl 10  MG suppository Commonly known as: DULCOLAX Place 10 mg rectally daily as needed (constipation).   clotrimazole 1 % cream Commonly known as: Lotrimin AF Apply 1 application topically 2 (two) times daily as needed.   haloperidol 0.5 MG tablet Commonly known as: HALDOL Take 0.5 mg by mouth 2 (two) times daily.   LORazepam 0.5 MG tablet Commonly known as: ATIVAN Take 1 tablet (0.5 mg total) by mouth every 6 (six) hours as needed.   sennosides 8.8 MG/5ML syrup Commonly known as: SENOKOT Take 5 mLs by mouth 2 (two) times daily.        Review of Systems  Unable to perform ROS: Dementia    Immunization History  Administered Date(s) Administered   Influenza, High Dose Seasonal PF 04/17/2019, 04/23/2020, 04/06/2021   Influenza,inj,Quad PF,6+ Mos 04/23/2018   Influenza-Unspecified 05/05/2010, 04/11/2013, 03/03/2015, 09/06/2016, 05/10/2017, 04/27/2020   Moderna SARS-COV2 Booster Vaccination 05/13/2020, 02/24/2021   Moderna Sars-Covid-2 Vaccination 07/08/2019, 08/05/2019, 04/13/2021   Pneumococcal Polysaccharide-23 12/25/2010   Pneumococcal-Unspecified 02/28/2013   Td 06/01/2017   Tdap 03/22/2006   Zoster Recombinat (Shingrix) 05/25/2017, 09/25/2017   Zoster, Live 11/01/2012   Pertinent  Health Maintenance Due  Topic Date Due   INFLUENZA VACCINE  01/31/2022      10/22/2020    8:00 PM 10/23/2020    8:00 AM 10/23/2020    8:00 PM 10/24/2020    9:45 AM 10/24/2020   11:00 PM  Fall Risk  Patient Fall Risk Level High fall risk High fall risk High fall risk High fall risk High fall risk   Functional Status Survey:    Vitals:   12/12/21 1432  BP: (!) 88/55  Pulse: (!) 58  Resp: 16  Temp: (!) 97.5 F (36.4 C)  SpO2: 96%  Weight: 150 lb 3.2 oz (68.1 kg)  Height: '5\' 5"'$  (1.651 m)   Body mass index is 24.99 kg/m. Physical Exam Vitals reviewed.  Constitutional:      Appearance: Normal appearance.     Comments: Does not respond or Follows any commands  HENT:     Head:  Normocephalic.     Nose: Nose normal.     Mouth/Throat:     Mouth: Mucous membranes are moist.     Pharynx: Oropharynx is clear.  Eyes:     Pupils: Pupils are equal, round, and reactive to light.  Cardiovascular:     Rate and Rhythm: Normal rate and regular rhythm.     Pulses: Normal pulses.     Heart sounds: No murmur heard. Pulmonary:     Effort: Pulmonary effort is normal. No respiratory distress.     Breath sounds: Normal breath sounds. No rales.  Abdominal:     General: Abdomen is flat. Bowel sounds are normal.     Palpations: Abdomen is soft.  Musculoskeletal:  General: No swelling.     Cervical back: Neck supple.  Skin:    General: Skin is warm.  Neurological:     Mental Status: He is alert.  Psychiatric:        Mood and Affect: Mood normal.        Thought Content: Thought content normal.     Labs reviewed: No results for input(s): "NA", "K", "CL", "CO2", "GLUCOSE", "BUN", "CREATININE", "CALCIUM", "MG", "PHOS" in the last 8760 hours. No results for input(s): "AST", "ALT", "ALKPHOS", "BILITOT", "PROT", "ALBUMIN" in the last 8760 hours. No results for input(s): "WBC", "NEUTROABS", "HGB", "HCT", "MCV", "PLT" in the last 8760 hours. Lab Results  Component Value Date   TSH 2.45 01/09/2019   Lab Results  Component Value Date   HGBA1C 7.1 (H) 10/21/2020   Lab Results  Component Value Date   CHOL 124 10/26/2016   HDL 40 10/26/2016   LDLCALC 62 10/26/2016   TRIG 111 10/26/2016    Significant Diagnostic Results in last 30 days:  No results found.  Assessment/Plan Oropharyngeal dysphagia  Regular, Nectar Thick, Pureed/Dysphagia 1 Frontal dementia (HCC) End stages On Haldol and ativan Enrolled in Hospice Bilateral primary osteoarthritis of knee On Tylenol prn Cough Lungs are clear  No Fever/SOB Continue to monitor  Family/ staff Communication:   Labs/tests ordered:

## 2021-12-18 ENCOUNTER — Encounter: Payer: Self-pay | Admitting: Internal Medicine

## 2022-01-10 ENCOUNTER — Encounter: Payer: Self-pay | Admitting: Orthopedic Surgery

## 2022-01-10 ENCOUNTER — Non-Acute Institutional Stay (SKILLED_NURSING_FACILITY): Payer: PPO | Admitting: Orthopedic Surgery

## 2022-01-10 DIAGNOSIS — R1312 Dysphagia, oropharyngeal phase: Secondary | ICD-10-CM

## 2022-01-10 DIAGNOSIS — G3109 Other frontotemporal dementia: Secondary | ICD-10-CM | POA: Diagnosis not present

## 2022-01-10 DIAGNOSIS — F0280 Dementia in other diseases classified elsewhere without behavioral disturbance: Secondary | ICD-10-CM

## 2022-01-10 DIAGNOSIS — K5901 Slow transit constipation: Secondary | ICD-10-CM | POA: Diagnosis not present

## 2022-01-10 DIAGNOSIS — R634 Abnormal weight loss: Secondary | ICD-10-CM | POA: Diagnosis not present

## 2022-01-10 DIAGNOSIS — H6121 Impacted cerumen, right ear: Secondary | ICD-10-CM

## 2022-01-10 NOTE — Progress Notes (Signed)
Location:  [facility] Room Number: 114/A Place of Service:  SNF [phone]) Provider: Yvonna Alanis, NP  Patient Care Team: [name], MD as PCP - General (Internal Medicine)  Extended Emergency Contact Information Primary Emergency Contact: Alley,Kelly Address: [address]          [place], [place] [address] [name] of [place] Phone: [phone] Relation: Daughter Secondary Emergency Contact: [place],[name] Address: [address] [place] [address] [place] Phone: [phone] Relation: Son  Code Status:  DNR Goals of care: Advanced Directive information    01/10/2022    9:23 AM  Advanced Directives  Does Patient Have a Medical Advance Directive? Yes  Type of Paramedic of [place];Out of facility DNR (pink MOST or yellow form)  Does patient want to make changes to medical advance directive? No - Patient declined  Copy of [place] in Chart? Yes - validated most recent copy scanned in chart (See row information)  Pre-existing out of facility DNR order (yellow form or pink MOST form) Yellow form placed in chart (order not valid for inpatient use);Pink MOST form placed in chart (order not valid for inpatient use)     Chief Complaint  Patient presents with   Medical Management of Chronic Issues    Routine visit.     HPI:  Pt is a 81 y.o. male seen today for medical management of chronic diseases.    He currently resides on the skilled nursing unit at [facility] Past medical history includes: HTN, CAD, dysphagia, T2DM, HLD, frontal dementia, squamous cell cancer, BPH, CKD stage 3, constipation, hyperuricemia and gait abnormality.   Frontal dementia- CT head 2021 revealed soft tissue swelling over right frontal skull and chronic small vessel ischemia, no recent behavioral outbursts, dependent with all ADLs, nonambulatory, nonverbal, followed by hospice, remains  on haldol and ativan Dysphagia- no recent aspirations, DYS1 diet/pureed with nectar thick fluids Weight loss- progressive weight loss in past 6 months, sometimes sleeps during meals, consumes 75-100% of most meals Constipation- LBM 07/10, remains on senna syrup dulcolax suppository prn  No recent injuries or falls. Ambulates with juditta chair/ hoyer transfer.   Recent blood pressures:  07/04- 90/60  06/27- 99/60  06/20- 109/74  Recent weights:  07/01- 145.4 lbs  06/06- 150.2 lbs  06/01- 149 lbs  Past Medical History:  Diagnosis Date   Benign hypertensive heart disease without heart failure 04/10/2016   BPH without obstruction/lower urinary tract symptoms 04/05/2016   CKD (chronic kidney disease) stage 3, GFR 30-59 ml/min (HCC) 01/08/2019   Constipation 06/22/2017   Coronary artery disease of native artery of native heart with stable angina pectoris ([place]) 04/10/2016   Dementia ([place])    Diabetes ([place])    Dysphagia 06/12/2019   Essential tremor 09/04/2016   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   Gait abnormality 11/13/2016   Heart disease    Hyperlipidemia    Hyperlipidemia associated with type 2 diabetes mellitus ([place]) 04/05/2016   Hyperuricemia 10/31/2016   Hypotension    Partial retinal detachment of left eye 04/10/2016   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 04/10/2016   Type 2 diabetes mellitus with neurological complications ([place]) 5/73/2202   Past Surgical History:  Procedure Laterality Date   APPENDECTOMY  1961   CORONARY ANGIOPLASTY WITH STENT PLACEMENT  2016   TONSILLECTOMY AND ADENOIDECTOMY     TOTAL KNEE ARTHROPLASTY  2012  Allergies  Allergen Reactions   Lortab [Hydrocodone-Acetaminophen] Other (See Comments)    Reaction:  Affected pts cognition    Morphine And Related Other (See Comments)    Reaction:  Affected pts cognition     Outpatient Encounter Medications as of 01/10/2022  Medication Sig   acetaminophen (TYLENOL) 325 MG tablet Take 650 mg by mouth 3 (three)  times daily. For perceived pain Am dosing to coincide with resident's sleep/wake cycle   acetaminophen (TYLENOL) 650 MG suppository Place 650 mg rectally every 4 (four) hours as needed for fever.   bisacodyl (DULCOLAX) 10 MG suppository Place 10 mg rectally daily as needed (constipation).    clotrimazole (LOTRIMIN AF) 1 % cream Apply 1 application topically 2 (two) times daily as needed.   haloperidol (HALDOL) 0.5 MG tablet Take 0.5 mg by mouth 2 (two) times daily. Agitation   LORazepam (ATIVAN) 0.5 MG tablet Take 1 tablet (0.5 mg total) by mouth every 6 (six) hours as needed.   sennosides (SENOKOT) 8.8 MG/5ML syrup Take 5 mLs by mouth 2 (two) times daily.   No facility-administered encounter medications on file as of 01/10/2022.    Review of Systems  Unable to perform ROS: Dementia    Immunization History  Administered Date(s) Administered   Influenza, High Dose Seasonal PF 04/17/2019, 04/23/2020, 04/06/2021   Influenza,inj,Quad PF,6+ Mos 04/23/2018   Influenza-Unspecified 05/05/2010, 04/11/2013, 03/03/2015, 09/06/2016, 05/10/2017, 04/27/2020   Moderna SARS-COV2 Booster Vaccination 05/13/2020, 02/24/2021   Moderna Sars-Covid-2 Vaccination 07/08/2019, 08/05/2019, 04/13/2021   Pneumococcal Polysaccharide-23 12/25/2010   Pneumococcal-Unspecified 02/28/2013   Td 06/01/2017   Tdap 03/22/2006   Zoster Recombinat (Shingrix) 05/25/2017, 09/25/2017   Zoster, Live 11/01/2012   Pertinent  Health Maintenance Due  Topic Date Due   INFLUENZA VACCINE  01/31/2022      10/22/2020    8:00 PM 10/23/2020    8:00 AM 10/23/2020    8:00 PM 10/24/2020    9:45 AM 10/24/2020   11:00 PM  Fall Risk  Patient Fall Risk Level High fall risk High fall risk High fall risk High fall risk High fall risk   Functional Status Survey:    Vitals:   01/10/22 0919  BP: 90/60  Pulse: (!) 55  Resp: 18  Temp: 97.9 F (36.6 C)  SpO2: 97%  Weight: 145 lb 6.4 oz (66 kg)  Height: '5\' 5"'$  (1.651 m)   Body mass index  is 24.2 kg/m. Physical Exam Vitals reviewed.  Constitutional:      General: He is not in acute distress. HENT:     Head: Normocephalic.     Right Ear: There is impacted cerumen.     Left Ear: There is no impacted cerumen.     Nose: Nose normal.     Mouth/Throat:     Mouth: Mucous membranes are moist.  Eyes:     General:        Right eye: No discharge.        Left eye: No discharge.  Cardiovascular:     Rate and Rhythm: Normal rate and regular rhythm.     Pulses: Normal pulses.     Heart sounds: Normal heart sounds.  Pulmonary:     Effort: Pulmonary effort is normal. No respiratory distress.     Breath sounds: Normal breath sounds. No wheezing.  Abdominal:     General: Bowel sounds are normal. There is no distension.     Palpations: Abdomen is soft.     Tenderness: There is no abdominal tenderness.  Musculoskeletal:     Cervical back: Neck supple.     Right lower leg: No edema.     Left lower leg: No edema.  Skin:    General: Skin is warm and dry.     Capillary Refill: Capillary refill takes less than 2 seconds.  Neurological:     General: No focal deficit present.     Mental Status: He is alert. Mental status is at baseline.     Motor: Weakness present.     Gait: Gait abnormal.     Comments: Hoyer/Juditta  Psychiatric:        Mood and Affect: Mood normal.        Behavior: Behavior normal.     Comments: Nonverbal, does not follow commands, alert to self     Labs reviewed: No results for input(s): "NA", "K", "CL", "CO2", "GLUCOSE", "BUN", "CREATININE", "CALCIUM", "MG", "PHOS" in the last 8760 hours. No results for input(s): "AST", "ALT", "ALKPHOS", "BILITOT", "PROT", "ALBUMIN" in the last 8760 hours. No results for input(s): "WBC", "NEUTROABS", "HGB", "HCT", "MCV", "PLT" in the last 8760 hours. Lab Results  Component Value Date   TSH 2.45 01/09/2019   Lab Results  Component Value Date   HGBA1C 7.1 (H) 10/21/2020   Lab Results  Component Value Date   CHOL  124 10/26/2016   HDL 40 10/26/2016   LDLCALC 62 10/26/2016   TRIG 111 10/26/2016    Significant Diagnostic Results in last 30 days:  No results found.  Assessment/Plan 1. 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 Oropharyngeal dysphagia - no recent aspirations - cont pureed foods and nectar fluids  3. Weight loss - weights stable - still eating well - cont monthly weights  4. Slow transit constipation - LBM 07/10, abdomen soft - cont senna and suppository prn  5. Hearing loss of right ear due to cerumen impaction - start [facility] debrox protocol    Family/ staff Communication: plan discussed with nurse  Labs/tests ordered:  none

## 2022-02-09 ENCOUNTER — Non-Acute Institutional Stay (SKILLED_NURSING_FACILITY): Payer: PPO | Admitting: Adult Health

## 2022-02-09 ENCOUNTER — Encounter: Payer: Self-pay | Admitting: Adult Health

## 2022-02-09 DIAGNOSIS — I119 Hypertensive heart disease without heart failure: Secondary | ICD-10-CM

## 2022-02-09 DIAGNOSIS — E1142 Type 2 diabetes mellitus with diabetic polyneuropathy: Secondary | ICD-10-CM

## 2022-02-09 DIAGNOSIS — F0280 Dementia in other diseases classified elsewhere without behavioral disturbance: Secondary | ICD-10-CM

## 2022-02-09 DIAGNOSIS — K5901 Slow transit constipation: Secondary | ICD-10-CM | POA: Diagnosis not present

## 2022-02-09 DIAGNOSIS — N1832 Chronic kidney disease, stage 3b: Secondary | ICD-10-CM | POA: Diagnosis not present

## 2022-02-09 DIAGNOSIS — G3109 Other frontotemporal dementia: Secondary | ICD-10-CM | POA: Diagnosis not present

## 2022-02-09 DIAGNOSIS — E1149 Type 2 diabetes mellitus with other diabetic neurological complication: Secondary | ICD-10-CM

## 2022-02-09 DIAGNOSIS — R1312 Dysphagia, oropharyngeal phase: Secondary | ICD-10-CM

## 2022-02-09 MED ORDER — SENNOSIDES 8.8 MG/5ML PO SYRP: 10.0000 mL | ORAL_SOLUTION | Freq: Two times a day (BID) | ORAL | 0 refills | Status: DC

## 2022-02-09 MED ORDER — SENNOSIDES 8.8 MG/5ML PO SYRP: 5.0000 mL | ORAL_SOLUTION | Freq: Two times a day (BID) | ORAL | 0 refills | Status: DC

## 2022-02-09 NOTE — Progress Notes (Signed)
Location:  [facility] Room Number: 114A Place of Service:  SNF [phone]) Provider:  Earney Hamburg, MD  Patient Care Team: [name], MD as PCP - General (Internal Medicine)  Extended Emergency Contact Information Primary Emergency Contact: Alley,Kelly Address: [address]          [place], [place] [address] [name] of [place] Phone: [phone] Relation: Daughter Secondary Emergency Contact: Chasteen,[name] Address: [address] [place] [address] [place] of [place] Phone: [phone] Relation: Son  Code Status:  DNR Goals of care: Advanced Directive information    01/10/2022    9:23 AM  Advanced Directives  Does Patient Have a Medical Advance Directive? Yes  Type of Paramedic of [place];Out of facility DNR (pink MOST or yellow form)  Does patient want to make changes to medical advance directive? No - Patient declined  Copy of [place] in Chart? Yes - validated most recent copy scanned in chart (See row information)  Pre-existing out of facility DNR order (yellow form or pink MOST form) Yellow form placed in chart (order not valid for inpatient use);Pink MOST form placed in chart (order not valid for inpatient use)     Chief Complaint  Patient presents with   Medical Management of Chronic Issues    Medical Management of Chronic Issues.     HPI:  Pt is a 81 y.o. male seen today for medical management of chronic diseases.    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  Has DNR and most form Has lost 5 lb in the past few months.  No recent aspiration, does have intermittent cough. on D1 diet NTL Wt Readings from Last 3 Encounters:  02/09/22 145 lb 9.6 oz (66 kg)  01/10/22 145 lb 6.4 oz (66  kg)  12/12/21 150 lb 3.2 oz (68.1 kg)    Requires skilled care with assistance in all ADLs. Has resistance to care.   BPH: incontinent with no retention issues noted.      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   Gait abnormality 11/13/2016   Heart disease    Hyperlipidemia    Hyperlipidemia associated with type 2 diabetes mellitus ([place]) 04/05/2016   Hyperuricemia 10/31/2016   Hypotension    Partial retinal detachment of left eye 04/10/2016   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 04/10/2016   Type 2 diabetes mellitus with neurological complications ([place]) 8/41/3244   Past Surgical History:  Procedure Laterality Date   APPENDECTOMY  1961   CORONARY ANGIOPLASTY WITH STENT PLACEMENT  2016   TONSILLECTOMY AND ADENOIDECTOMY     TOTAL KNEE ARTHROPLASTY  2012    Allergies  Allergen Reactions   Lortab [Hydrocodone-Acetaminophen] Other (See Comments)    Reaction:  Affected pts cognition    Morphine And Related  Other (See Comments)    Reaction:  Affected pts cognition     Outpatient Encounter Medications as of 02/09/2022  Medication Sig   acetaminophen (TYLENOL) 325 MG tablet Take 650 mg by mouth 3 (three) times daily. For perceived pain Am dosing to coincide with resident's sleep/wake cycle   acetaminophen (TYLENOL) 650 MG suppository Place 650 mg rectally every 4 (four) hours as needed for fever.   bisacodyl (DULCOLAX) 10 MG suppository Place 10 mg rectally daily as needed (constipation).    clotrimazole (LOTRIMIN AF) 1 % cream  Apply 1 application topically 2 (two) times daily as needed.   haloperidol (HALDOL) 0.5 MG tablet Take 0.5 mg by mouth 2 (two) times daily. Agitation   LORazepam (ATIVAN) 0.5 MG tablet Take 1 tablet (0.5 mg total) by mouth every 6 (six) hours as needed.   sennosides (SENOKOT) 8.8 MG/5ML syrup Take 5 mLs by mouth 2 (two) times daily.   No facility-administered encounter medications on file as of 02/09/2022.    Review of Systems  Immunization History  Administered Date(s) Administered   Influenza, High Dose Seasonal PF 04/17/2019, 04/23/2020, 04/06/2021   Influenza,inj,Quad PF,6+ Mos 04/23/2018   Influenza-Unspecified 05/05/2010, 04/11/2013, 03/03/2015, 09/06/2016, 05/10/2017, 04/27/2020   Moderna SARS-COV2 Booster Vaccination 05/13/2020, 02/24/2021   Moderna Sars-Covid-2 Vaccination 07/08/2019, 08/05/2019, 04/13/2021   Pneumococcal Polysaccharide-23 12/25/2010   Pneumococcal-Unspecified 02/28/2013   Td 06/01/2017   Tdap 03/22/2006   Zoster Recombinat (Shingrix) 05/25/2017, 09/25/2017   Zoster, Live 11/01/2012   Pertinent  Health Maintenance Due  Topic Date Due   INFLUENZA VACCINE  01/31/2022      10/22/2020    8:00 PM 10/23/2020    8:00 AM 10/23/2020    8:00 PM 10/24/2020    9:45 AM 10/24/2020   11:00 PM  Fall Risk  Patient Fall Risk Level High fall risk High fall risk High fall risk High fall risk High fall risk   Functional Status Survey:    Vitals:   02/09/22 1356  BP: 100/64  Pulse: (!) 52  Resp: 17  Temp: (!) 97.5 F (36.4 C)  TempSrc: Skin  SpO2: 99%  Weight: 145 lb 9.6 oz (66 kg)  Height: '5\' 5"'$  (1.651 m)   Body mass index is 24.23 kg/m. Physical Exam  Labs reviewed: No results for input(s): "NA", "K", "CL", "CO2", "GLUCOSE", "BUN", "CREATININE", "CALCIUM", "MG", "PHOS" in the last 8760 hours. No results for input(s): "AST", "ALT", "ALKPHOS", "BILITOT", "PROT", "ALBUMIN" in the last 8760 hours. No results for input(s): "WBC", "NEUTROABS", "HGB", "HCT",  "MCV", "PLT" in the last 8760 hours. Lab Results  Component Value Date   TSH 2.45 01/09/2019   Lab Results  Component Value Date   HGBA1C 7.1 (H) 10/21/2020   Lab Results  Component Value Date   CHOL 124 10/26/2016   HDL 40 10/26/2016   LDLCALC 62 10/26/2016   TRIG 111 10/26/2016    Significant Diagnostic Results in last 30 days:  No results found.  Assessment/Plan  1. Slow transit constipation Apparently he had a BM this week that needs to be documented.  Continue senokot.   2. Benign hypertensive heart disease without heart failure Off plavix due to goals of care.   3. Oropharyngeal dysphagia Continue modified diet Asp prec  4. Type 2 diabetes mellitus with neurological complications (HCC) Diet controlled  5. Diabetic peripheral neuropathy associated with type 2 diabetes mellitus (HCC) Off neurontin, no current issues with pain.   6. 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 Stage 3b chronic kidney disease ([place]) Lab Results  Component Value Date   BUN 25 (H) 10/22/2020   Lab Results  Component Value Date   CREATININE 1.20 10/22/2020   Not having frequent labs due to goals of care.      Labs/tests ordered:  NA

## 2022-03-14 ENCOUNTER — Encounter: Payer: Self-pay | Admitting: Internal Medicine

## 2022-03-14 ENCOUNTER — Non-Acute Institutional Stay (SKILLED_NURSING_FACILITY): Payer: PPO | Admitting: Internal Medicine

## 2022-03-14 DIAGNOSIS — F0280 Dementia in other diseases classified elsewhere without behavioral disturbance: Secondary | ICD-10-CM | POA: Diagnosis not present

## 2022-03-14 DIAGNOSIS — G3109 Other frontotemporal dementia: Secondary | ICD-10-CM | POA: Diagnosis not present

## 2022-03-14 DIAGNOSIS — R1312 Dysphagia, oropharyngeal phase: Secondary | ICD-10-CM

## 2022-03-14 DIAGNOSIS — M170 Bilateral primary osteoarthritis of knee: Secondary | ICD-10-CM

## 2022-03-14 NOTE — Progress Notes (Signed)
Location:  [facility] Room Number: 114/A Place of Service:  SNF [phone]) Provider:  [name], MD  Patient Care Team: [name], MD as PCP - General (Internal Medicine)  Extended Emergency Contact Information Primary Emergency Contact: Alley,Kelly Address: [address]          [place], [place] [address] [name] of [place] Phone: [phone] Relation: Daughter Secondary Emergency Contact: Osmon,[name] Address: [address] [place] [address] [place] of [place] Phone: [phone] Relation: Son  Code Status:  DNR/Hospice Goals of care: Advanced Directive information    03/14/2022    2:26 PM  Advanced Directives  Does Patient Have a Medical Advance Directive? Yes  Type of Advance Directive Out of facility DNR (pink MOST or yellow form)  Does patient want to make changes to medical advance directive? No - Patient declined  Pre-existing out of facility DNR order (yellow form or pink MOST form) Yellow form placed in chart (order not valid for inpatient use);Pink MOST form placed in chart (order not valid for inpatient use)     Chief Complaint  Patient presents with   Medical Management of Chronic Issues    Routine  Discuss need for flu vaccine.    HPI:  Pt is a 81 y.o. male seen today for medical management of chronic diseases.    Lives in SNF under Hospice Patient with End Stage Dementia, Dysphagia, Recurent UTI, CKD CAD s/p Stent Placement Now Under Hospice care   He is stable. No new Nursing issues. No Behavior issues His weight is stable Total Care with Harrel Lemon and meed help with Feeding No Falls Wt Readings from Last 3 Encounters:  03/14/22 149 lb 9.6 oz (67.9 kg)  02/09/22 145 lb 9.6 oz (66 kg)  01/10/22 145 lb 6.4 oz (66 kg)   Past Medical History:  Diagnosis Date   Benign hypertensive heart disease without heart failure 04/10/2016   BPH without obstruction/lower urinary tract  symptoms 04/05/2016   CKD (chronic kidney disease) stage 3, GFR 30-59 ml/min (HCC) 01/08/2019   Constipation 06/22/2017   Coronary artery disease of native artery of native heart with stable angina pectoris ([place]) 04/10/2016   Dementia ([place])    Diabetes ([place])    Dysphagia 06/12/2019   Essential tremor 09/04/2016   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   Gait abnormality 11/13/2016   Heart disease    Hyperlipidemia    Hyperlipidemia associated with type 2 diabetes mellitus ([place]) 04/05/2016   Hyperuricemia 10/31/2016   Hypotension    Partial retinal detachment of left eye 04/10/2016   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 04/10/2016   Type 2 diabetes mellitus with neurological complications ([place]) 7/98/9211   Past Surgical History:  Procedure Laterality Date   APPENDECTOMY  1961   CORONARY ANGIOPLASTY WITH STENT PLACEMENT  2016   TONSILLECTOMY AND ADENOIDECTOMY     TOTAL KNEE ARTHROPLASTY  2012    Allergies  Allergen Reactions   Lortab [Hydrocodone-Acetaminophen] Other (See Comments)    Reaction:  Affected pts cognition    Morphine And Related Other (See Comments)    Reaction:  Affected pts cognition     Allergies as of 03/14/2022       Reactions   Lortab [hydrocodone-acetaminophen] Other (See Comments)   Reaction:  Affected pts cognition    Morphine And Related Other (See Comments)   Reaction:  Affected pts cognition  Medication List        Accurate as of March 14, 2022  2:35 PM. If you have any questions, ask your nurse or doctor.          acetaminophen 650 MG suppository Commonly known as: TYLENOL Place 650 mg rectally every 4 (four) hours as needed for fever (Up to 72 hours.).   acetaminophen 325 MG tablet Commonly known as: TYLENOL Take 650 mg by mouth 3 (three) times daily. For perceived pain Am dosing to coincide with resident's sleep/wake cycle   bisacodyl 10 MG suppository Commonly known as: DULCOLAX Place 10 mg rectally daily as needed  (constipation).   clotrimazole 1 % cream Commonly known as: Lotrimin AF Apply 1 application topically 2 (two) times daily as needed.   haloperidol 0.5 MG tablet Commonly known as: HALDOL Take 0.5 mg by mouth 2 (two) times daily. Agitation   LORazepam 0.5 MG tablet Commonly known as: ATIVAN Take 1 tablet (0.5 mg total) by mouth every 6 (six) hours as needed.   sennosides 8.8 MG/5ML syrup Commonly known as: SENOKOT Take 5 mLs by mouth 2 (two) times daily.        Review of Systems  Unable to perform ROS: Dementia    Immunization History  Administered Date(s) Administered   Influenza, High Dose Seasonal PF 04/17/2019, 04/23/2020, 04/06/2021   Influenza,inj,Quad PF,6+ Mos 04/23/2018   Influenza-Unspecified 05/05/2010, 04/11/2013, 03/03/2015, 09/06/2016, 05/10/2017, 04/27/2020   Moderna SARS-COV2 Booster Vaccination 05/13/2020, 02/24/2021   Moderna Sars-Covid-2 Vaccination 07/08/2019, 08/05/2019, 04/13/2021   Pneumococcal Polysaccharide-23 12/25/2010   Pneumococcal-Unspecified 02/28/2013   Td 06/01/2017   Tdap 03/22/2006   Zoster Recombinat (Shingrix) 05/25/2017, 09/25/2017   Zoster, Live 11/01/2012   Pertinent  Health Maintenance Due  Topic Date Due   INFLUENZA VACCINE  01/31/2022      10/22/2020    8:00 PM 10/23/2020    8:00 AM 10/23/2020    8:00 PM 10/24/2020    9:45 AM 10/24/2020   11:00 PM  Fall Risk  Patient Fall Risk Level High fall risk High fall risk High fall risk High fall risk High fall risk   Functional Status Survey:    Vitals:   03/14/22 1418  BP: (!) 86/55  Pulse: (!) 57  Resp: 16  Temp: 97.9 F (36.6 C)  SpO2: 99%  Weight: 149 lb 9.6 oz (67.9 kg)  Height: '5\' 5"'$  (1.651 m)   Body mass index is 24.89 kg/m. Physical Exam Vitals reviewed.  Constitutional:      Appearance: Normal appearance.  HENT:     Head: Normocephalic.     Nose: Nose normal.     Mouth/Throat:     Mouth: Mucous membranes are moist.     Pharynx: Oropharynx is clear.   Eyes:     Pupils: Pupils are equal, round, and reactive to light.  Cardiovascular:     Rate and Rhythm: Normal rate and regular rhythm.     Pulses: Normal pulses.     Heart sounds: No murmur heard. Pulmonary:     Effort: Pulmonary effort is normal. No respiratory distress.     Breath sounds: Normal breath sounds. No rales.  Abdominal:     General: Abdomen is flat. Bowel sounds are normal.     Palpations: Abdomen is soft.  Musculoskeletal:        General: No swelling.     Cervical back: Neck supple.  Skin:    General: Skin is warm.  Neurological:     General: No focal deficit  present.     Mental Status: He is alert.     Comments: Does not respond  Psychiatric:        Mood and Affect: Mood normal.        Thought Content: Thought content normal.     Labs reviewed: No results for input(s): "NA", "K", "CL", "CO2", "GLUCOSE", "BUN", "CREATININE", "CALCIUM", "MG", "PHOS" in the last 8760 hours. No results for input(s): "AST", "ALT", "ALKPHOS", "BILITOT", "PROT", "ALBUMIN" in the last 8760 hours. No results for input(s): "WBC", "NEUTROABS", "HGB", "HCT", "MCV", "PLT" in the last 8760 hours. Lab Results  Component Value Date   TSH 2.45 01/09/2019   Lab Results  Component Value Date   HGBA1C 7.1 (H) 10/21/2020   Lab Results  Component Value Date   CHOL 124 10/26/2016   HDL 40 10/26/2016   LDLCALC 62 10/26/2016   TRIG 111 10/26/2016    Significant Diagnostic Results in last 30 days:  No results found.  Assessment/Plan 1. Oropharyngeal dysphagia Regular Nectar Thick  2. 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 Bilateral primary osteoarthritis of knee Tylenol     Family/ staff Communication:   Labs/tests ordered:

## 2022-04-13 ENCOUNTER — Encounter: Payer: Self-pay | Admitting: Adult Health

## 2022-04-13 ENCOUNTER — Non-Acute Institutional Stay (SKILLED_NURSING_FACILITY): Payer: PPO | Admitting: Adult Health

## 2022-04-13 DIAGNOSIS — F0280 Dementia in other diseases classified elsewhere without behavioral disturbance: Secondary | ICD-10-CM

## 2022-04-13 DIAGNOSIS — E790 Hyperuricemia without signs of inflammatory arthritis and tophaceous disease: Secondary | ICD-10-CM | POA: Diagnosis not present

## 2022-04-13 DIAGNOSIS — R1312 Dysphagia, oropharyngeal phase: Secondary | ICD-10-CM

## 2022-04-13 DIAGNOSIS — I25118 Atherosclerotic heart disease of native coronary artery with other forms of angina pectoris: Secondary | ICD-10-CM

## 2022-04-13 DIAGNOSIS — E1149 Type 2 diabetes mellitus with other diabetic neurological complication: Secondary | ICD-10-CM | POA: Diagnosis not present

## 2022-04-13 DIAGNOSIS — G3109 Other frontotemporal dementia: Secondary | ICD-10-CM | POA: Diagnosis not present

## 2022-04-13 DIAGNOSIS — K5901 Slow transit constipation: Secondary | ICD-10-CM | POA: Diagnosis not present

## 2022-04-13 DIAGNOSIS — M170 Bilateral primary osteoarthritis of knee: Secondary | ICD-10-CM

## 2022-04-13 NOTE — Assessment & Plan Note (Signed)
No signs of pain Continue scheduled tylenol

## 2022-04-13 NOTE — Assessment & Plan Note (Signed)
Diet controlled.  

## 2022-04-13 NOTE — Assessment & Plan Note (Signed)
Continue D 1 diet with NTL Asp prec

## 2022-04-13 NOTE — Assessment & Plan Note (Signed)
Not currently on meds, no recent bouts of gout.

## 2022-04-13 NOTE — Progress Notes (Signed)
Location:  Occupational psychologist of Service:  SNF (31) Provider:  Earney Hamburg, MD  Patient Care Team: [name], MD as PCP - General (Internal Medicine)  Extended Emergency Contact Information Primary Emergency Contact: Alley,Kelly Address: [address]          [place], [place] [address] [name] of [place] Phone: [phone] Relation: Daughter Secondary Emergency Contact: Berrey,[name] Address: [address] [place] [address] [place] of [place] Phone: [phone] Relation: Son  Code Status:  DNR Goals of care: Advanced Directive information    03/14/2022    2:26 PM  Advanced Directives  Does Patient Have a Medical Advance Directive? Yes  Type of Advance Directive Out of facility DNR (pink MOST or yellow form)  Does patient want to make changes to medical advance directive? No - Patient declined  Pre-existing out of facility DNR order (yellow form or pink MOST form) Yellow form placed in chart (order not valid for inpatient use);Pink MOST form placed in chart (order not valid for inpatient use)     Chief Complaint  Patient presents with   Medical Management of Chronic Issues    HPI:  Pt is a 80 y.o. male seen today for medical management of chronic diseases.    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  Has DNR and most form No recent aspiration, does have intermittent cough. on D1 diet NTL  Weight trended upward after  period of small weight loss. Nurse reports he eats well. He has a chipped tooth.  Wt Readings from Last 3 Encounters:  04/13/22 148 lb 9.6 oz (67.4 kg)  03/14/22 149 lb 9.6 oz (67.9 kg)  02/09/22 145 lb 9.6 oz (66 kg)    Requires skilled care with assistance in all ADLs. Has resistance to personal care. When in bed can  become anxious or agitated. Calm when in his chair. Keeps his eyes closed most of the time. Not able to f/c.  Staff use ativan when he is agitated which is somewhat effective.    No longer on plavix (was on due to CAD with stent) due to goals of care.   Bowels moving well with senokot.   HR runs in the 50s. 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   Gait abnormality 11/13/2016   Heart disease    Hyperlipidemia    Hyperlipidemia associated with type 2 diabetes mellitus ([place]) 04/05/2016   Hyperuricemia 10/31/2016   Hypotension    Partial retinal detachment of left eye 04/10/2016   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 04/10/2016   Type 2 diabetes mellitus with neurological complications ([place]) 0/99/8338   Past Surgical History:  Procedure Laterality Date   [place] WITH STENT PLACEMENT  2016  TONSILLECTOMY AND ADENOIDECTOMY     TOTAL KNEE ARTHROPLASTY  2012    Allergies  Allergen Reactions   Lortab [Hydrocodone-Acetaminophen] Other (See Comments)    Reaction:  Affected pts cognition    Morphine And Related Other (See Comments)    Reaction:  Affected pts cognition     Outpatient Encounter Medications as of 04/13/2022  Medication Sig   acetaminophen (TYLENOL) 325 MG tablet Take 650 mg by mouth 3 (three) times daily. For perceived pain Am dosing to coincide with resident's sleep/wake cycle   acetaminophen (TYLENOL) 650 MG suppository Place 650 mg rectally every 4  (four) hours as needed for fever (Up to 72 hours.).   bisacodyl (DULCOLAX) 10 MG suppository Place 10 mg rectally daily as needed (constipation).    clotrimazole (LOTRIMIN AF) 1 % cream Apply 1 application topically 2 (two) times daily as needed.   haloperidol (HALDOL) 0.5 MG tablet Take 0.5 mg by mouth 2 (two) times daily. Agitation   LORazepam (ATIVAN) 0.5 MG tablet Take 1 tablet (0.5 mg total) by mouth every 6 (six) hours as needed.   sennosides (SENOKOT) 8.8 MG/5ML syrup Take 5 mLs by mouth 2 (two) times daily.   No facility-administered encounter medications on file as of 04/13/2022.    Review of Systems  Unable to perform ROS: Dementia    Immunization History  Administered Date(s) Administered   Influenza, High Dose Seasonal PF 04/17/2019, 04/23/2020, 04/06/2021   Influenza,inj,Quad PF,6+ Mos 04/23/2018   Influenza-Unspecified 05/05/2010, 04/11/2013, 03/03/2015, 09/06/2016, 05/10/2017, 04/27/2020   Moderna SARS-COV2 Booster Vaccination 05/13/2020, 02/24/2021   Moderna Sars-Covid-2 Vaccination 07/08/2019, 08/05/2019, 04/13/2021   Pneumococcal Polysaccharide-23 12/25/2010   Pneumococcal-Unspecified 02/28/2013   Td 06/01/2017   Tdap 03/22/2006   Zoster Recombinat (Shingrix) 05/25/2017, 09/25/2017   Zoster, Live 11/01/2012   Pertinent  Health Maintenance Due  Topic Date Due   INFLUENZA VACCINE  01/31/2022      10/22/2020    8:00 PM 10/23/2020    8:00 AM 10/23/2020    8:00 PM 10/24/2020    9:45 AM 10/24/2020   11:00 PM  Fall Risk  Patient Fall Risk Level High fall risk High fall risk High fall risk High fall risk High fall risk   Functional Status Survey:    Vitals:   04/13/22 1626  Weight: 148 lb 9.6 oz (67.4 kg)   Body mass index is 24.73 kg/m. Physical Exam Constitutional:      General: He is not in acute distress.    Appearance: He is not diaphoretic.     Comments: Agitated.   HENT:     Head: Normocephalic and atraumatic.     Mouth/Throat:     Comments:  Would not open eyes or mouth.  Neck:     Thyroid: No thyromegaly.     Vascular: No JVD.     Trachea: No tracheal deviation.  Cardiovascular:     Rate and Rhythm: Normal rate and regular rhythm.     Heart sounds: No murmur heard. Pulmonary:     Effort: Pulmonary effort is normal. No respiratory distress.     Breath sounds: Normal breath sounds. No wheezing.  Abdominal:     General: Bowel sounds are normal. There is no distension.     Palpations: Abdomen is soft.     Tenderness: There is no abdominal tenderness.  Lymphadenopathy:     Cervical: No cervical adenopathy.  Skin:    General: Skin is warm and dry.  Neurological:     General: No focal deficit  present.     Mental Status: He is alert. Mental status is at baseline.     Labs reviewed: No results for input(s): "NA", "K", "CL", "CO2", "GLUCOSE", "BUN", "CREATININE", "CALCIUM", "MG", "PHOS" in the last 8760 hours. No results for input(s): "AST", "ALT", "ALKPHOS", "BILITOT", "PROT", "ALBUMIN" in the last 8760 hours. No results for input(s): "WBC", "NEUTROABS", "HGB", "HCT", "MCV", "PLT" in the last 8760 hours. Lab Results  Component Value Date   TSH 2.45 01/09/2019   Lab Results  Component Value Date   HGBA1C 7.1 (H) 10/21/2020   Lab Results  Component Value Date   CHOL 124 10/26/2016   HDL 40 10/26/2016   LDLCALC 62 10/26/2016   TRIG 111 10/26/2016    Significant Diagnostic Results in last 30 days:  No results found.  Assessment/Plan  Dysphagia Continue D 1 diet with NTL Asp prec  Type 2 diabetes mellitus with neurological complications (HCC) Diet controlled   Frontal dementia (HCC) Severe stage Followed by hospice Fairly stable with periods of agitation would not taper haldol or ativan   Bilateral primary osteoarthritis of knee No signs of pain Continue scheduled tylenol   Hyperuricemia Not currently on meds, no recent bouts of gout.   Constipation Controlled with senokot.   Coronary artery  disease of native artery of native heart with stable angina pectoris ([place]) Off plavix due to goals of care     Labs/tests ordered:  NA

## 2022-04-13 NOTE — Assessment & Plan Note (Signed)
Off plavix due to goals of care

## 2022-04-13 NOTE — Assessment & Plan Note (Addendum)
Severe stage Followed by hospice Fairly stable with periods of agitation would not taper haldol or ativan

## 2022-04-13 NOTE — Assessment & Plan Note (Signed)
Controlled with senokot.

## 2022-05-15 ENCOUNTER — Non-Acute Institutional Stay (SKILLED_NURSING_FACILITY): Admitting: Adult Health

## 2022-05-15 ENCOUNTER — Encounter: Payer: Self-pay | Admitting: Adult Health

## 2022-05-15 DIAGNOSIS — F0280 Dementia in other diseases classified elsewhere without behavioral disturbance: Secondary | ICD-10-CM | POA: Diagnosis not present

## 2022-05-15 DIAGNOSIS — G3109 Other frontotemporal dementia: Secondary | ICD-10-CM | POA: Diagnosis not present

## 2022-05-15 DIAGNOSIS — N400 Enlarged prostate without lower urinary tract symptoms: Secondary | ICD-10-CM | POA: Diagnosis not present

## 2022-05-15 DIAGNOSIS — K5901 Slow transit constipation: Secondary | ICD-10-CM | POA: Diagnosis not present

## 2022-05-15 DIAGNOSIS — M170 Bilateral primary osteoarthritis of knee: Secondary | ICD-10-CM

## 2022-05-15 DIAGNOSIS — E1149 Type 2 diabetes mellitus with other diabetic neurological complication: Secondary | ICD-10-CM

## 2022-05-15 DIAGNOSIS — N1832 Chronic kidney disease, stage 3b: Secondary | ICD-10-CM | POA: Diagnosis not present

## 2022-05-15 DIAGNOSIS — R1312 Dysphagia, oropharyngeal phase: Secondary | ICD-10-CM | POA: Diagnosis not present

## 2022-05-15 DIAGNOSIS — R634 Abnormal weight loss: Secondary | ICD-10-CM

## 2022-05-15 NOTE — Progress Notes (Signed)
Location:  Occupational psychologist of Service:  SNF (31) Provider:  Earney Hamburg, MD  Patient Care Team: [name], MD as PCP - General (Internal Medicine)  Extended Emergency Contact Information Primary Emergency Contact: Alley,Kelly Address: [address]          [place], [place] [address] [name] of [place] Phone: [phone] Relation: Daughter Secondary Emergency Contact: Cangemi,[name] Address: [address] [place] [address] [place] Phone: [phone] Relation: Son  Code Status:  DNR Goals of care: Advanced Directive information    03/14/2022    2:26 PM  Advanced Directives  Does Patient Have a Medical Advance Directive? Yes  Type of Advance Directive Out of facility DNR (pink MOST or yellow form)  Does patient want to make changes to medical advance directive? No - Patient declined  Pre-existing out of facility DNR order (yellow form or pink MOST form) Yellow form placed in chart (order not valid for inpatient use);Pink MOST form placed in chart (order not valid for inpatient use)     Chief Complaint  Patient presents with   Medical Management of Chronic Issues    HPI:  Pt is a 81 y.o. male seen today for medical management of chronic diseases.    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  Has DNR and most form No recent aspiration, does have intermittent cough. on D1 diet NTL Has lost 5 lbs in the past two months.   Treated for dental abscess on 10/19. Nurse reports no current issues with pain or swallowing that we can tell but the patient is non verbal.   Weight trended upward after  period of small weight loss. Nurse reports he eats well. He has a chipped tooth.  Wt Readings from Last 3 Encounters:  05/15/22 144 lb 6.4  oz (65.5 kg)  04/13/22 148 lb 9.6 oz (67.4 kg)  03/14/22 149 lb 9.6 oz (67.9 kg)    Requires skilled care with assistance in all ADLs. Has resistance to personal care. When in bed can become anxious or agitated. Calm when in his chair. Keeps his eyes closed most of the time. Not able to f/c.       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/04/2016   Gait abnormality 11/13/2016   Heart disease    Hyperlipidemia    Hyperlipidemia associated with type 2 diabetes mellitus ([place]) 04/05/2016   Hyperuricemia 10/31/2016   Hypotension    Partial retinal detachment of left eye 04/10/2016   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 04/10/2016   Type 2 diabetes mellitus with neurological complications ([place]) 9/62/9528   Past Surgical History:  Procedure Laterality Date   APPENDECTOMY  1961   CORONARY ANGIOPLASTY WITH STENT PLACEMENT  2016   TONSILLECTOMY AND ADENOIDECTOMY     TOTAL KNEE  ARTHROPLASTY  2012    Allergies  Allergen Reactions   Lortab [Hydrocodone-Acetaminophen] Other (See Comments)    Reaction:  Affected pts cognition    Morphine And Related Other (See Comments)    Reaction:  Affected pts cognition     Outpatient Encounter Medications as of 05/15/2022  Medication Sig   acetaminophen (TYLENOL) 325 MG tablet Take 650 mg by mouth 3 (three) times daily. For perceived pain Am dosing to coincide with resident's sleep/wake cycle   acetaminophen (TYLENOL) 650 MG suppository Place 650 mg rectally every 4 (four) hours as needed for fever (Up to 72  hours.).   bisacodyl (DULCOLAX) 10 MG suppository Place 10 mg rectally daily as needed (constipation).    clotrimazole (LOTRIMIN AF) 1 % cream Apply 1 application topically 2 (two) times daily as needed.   haloperidol (HALDOL) 0.5 MG tablet Take 0.5 mg by mouth 2 (two) times daily. Agitation   LORazepam (ATIVAN) 0.5 MG tablet Take 1 tablet (0.5 mg total) by mouth every 6 (six) hours as needed.   sennosides (SENOKOT) 8.8 MG/5ML syrup Take 5 mLs by mouth 2 (two) times daily.   No facility-administered encounter medications on file as of 05/15/2022.    Review of Systems  Unable to perform ROS: Dementia    Immunization History  Administered Date(s) Administered   Fluad Quad(high Dose 65+) 04/07/2022   Influenza, High Dose Seasonal PF 04/17/2019, 04/23/2020, 04/06/2021   Influenza,inj,Quad PF,6+ Mos 04/23/2018   Influenza-Unspecified 05/05/2010, 04/11/2013, 03/03/2015, 09/06/2016, 05/10/2017, 04/27/2020   Moderna SARS-COV2 Booster Vaccination 05/13/2020, 02/24/2021   Moderna Sars-Covid-2 Vaccination 07/08/2019, 08/05/2019, 04/13/2021   Pneumococcal Polysaccharide-23 12/25/2010   Pneumococcal-Unspecified 02/28/2013   Td 06/01/2017   Tdap 03/22/2006   Zoster Recombinat (Shingrix) 05/25/2017, 09/25/2017   Zoster, Live 11/01/2012   Pertinent  Health Maintenance Due  Topic Date Due   INFLUENZA VACCINE  Completed      10/22/2020    8:00 PM 10/23/2020    8:00 AM 10/23/2020    8:00 PM 10/24/2020    9:45 AM 10/24/2020   11:00 PM  Fall Risk  Patient Fall Risk Level High fall risk High fall risk High fall risk High fall risk High fall risk   Functional Status Survey:    Vitals:   05/15/22 1643  Weight: 144 lb 6.4 oz (65.5 kg)   Body mass index is 24.03 kg/m. Wt Readings from Last 3 Encounters:  05/15/22 144 lb 6.4 oz (65.5 kg)  04/13/22 148 lb 9.6 oz (67.4 kg)  03/14/22 149 lb 9.6 oz (67.9 kg)    Physical Exam Constitutional:      General: He is not in acute distress.     Appearance: He is not diaphoretic.  HENT:     Head: Normocephalic and atraumatic.     Mouth/Throat:     Mouth: Mucous membranes are dry.     Pharynx: Oropharynx is clear.     Comments: Would not open eyes or mouth.  Neck:     Thyroid: No thyromegaly.     Vascular: No JVD.     Trachea: No tracheal deviation.  Cardiovascular:     Rate and Rhythm: Normal rate and regular rhythm.     Heart sounds: No murmur heard. Pulmonary:     Effort: Pulmonary effort is normal. No respiratory distress.     Breath sounds: Normal breath sounds. No wheezing.  Abdominal:     General: Bowel sounds are normal. There is no distension.     Palpations: Abdomen is soft.  Tenderness: There is no abdominal tenderness.  Lymphadenopathy:     Cervical: No cervical adenopathy.  Skin:    General: Skin is warm and dry.  Neurological:     General: No focal deficit present.     Mental Status: He is alert. Mental status is at baseline.     Labs reviewed: No results for input(s): "NA", "K", "CL", "CO2", "GLUCOSE", "BUN", "CREATININE", "CALCIUM", "MG", "PHOS" in the last 8760 hours. No results for input(s): "AST", "ALT", "ALKPHOS", "BILITOT", "PROT", "ALBUMIN" in the last 8760 hours. No results for input(s): "WBC", "NEUTROABS", "HGB", "HCT", "MCV", "PLT" in the last 8760 hours. Lab Results  Component Value Date   TSH 2.45 01/09/2019   Lab Results  Component Value Date   HGBA1C 7.1 (H) 10/21/2020   Lab Results  Component Value Date   CHOL 124 10/26/2016   HDL 40 10/26/2016   LDLCALC 62 10/26/2016   TRIG 111 10/26/2016    Significant Diagnostic Results in last 30 days:  No results found.  Assessment/Plan  1. Weight loss Had recent tooth abscess Also with progressive dementia and dysphagia Continue to monitor.   2. Oropharyngeal dysphagia Continue modified diet and asp prec  3. Type 2 diabetes mellitus with neurological complications [facility]) Not currently on meds but has been historically  controlled Goals of care comfort based.   4. 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  Under hospice care.   5. Bilateral primary osteoarthritis of knee No reports of pain Continue tylenol   6. Stage 3b chronic kidney disease ([place]) Noted.  Not monitoring labs frequently due to hospice status.   7. BPH without obstruction/lower urinary tract symptoms No current issues, has wet briefs.   8. Slow transit constipation Continue senokot.     Labs/tests ordered:  NA

## 2022-06-12 ENCOUNTER — Encounter: Payer: Self-pay | Admitting: Internal Medicine

## 2022-06-12 ENCOUNTER — Non-Acute Institutional Stay (SKILLED_NURSING_FACILITY): Payer: PPO | Admitting: Internal Medicine

## 2022-06-12 DIAGNOSIS — M170 Bilateral primary osteoarthritis of knee: Secondary | ICD-10-CM

## 2022-06-12 DIAGNOSIS — G3109 Other frontotemporal dementia: Secondary | ICD-10-CM | POA: Diagnosis not present

## 2022-06-12 DIAGNOSIS — R1312 Dysphagia, oropharyngeal phase: Secondary | ICD-10-CM

## 2022-06-12 DIAGNOSIS — F0280 Dementia in other diseases classified elsewhere without behavioral disturbance: Secondary | ICD-10-CM | POA: Diagnosis not present

## 2022-06-12 NOTE — Progress Notes (Signed)
Location:  [facility] Room Number: 114A Place of Service:  SNF [phone]) Provider:  [name], MD    [name], MD  Patient Care Team: [name], MD as PCP - General (Internal Medicine)  Extended Emergency Contact Information Primary Emergency Contact: Castillo,Jose Address: [address]          [place], [place] [address] Jose Castillo of [place] Phone: [phone] Relation: Daughter Secondary Emergency Contact: Castillo,Jose Address: [address] [place] [address] [place] of [place] Phone: [phone] Relation: Son  Code Status:  DNR hospice Goals of care: Advanced Directive information    06/12/2022   10:30 AM  Advanced Directives  Does Patient Have a Medical Advance Directive? Yes  Type of Paramedic of [place];Out of facility DNR (pink MOST or yellow form)  Does patient want to make changes to medical advance directive? No - Patient declined  Copy of [place] in Chart? Yes - validated most recent copy scanned in chart (See row information)     Chief Complaint  Patient presents with   Medical Management of Chronic Issues    Routine visit    HPI:  Pt is a 81 y.o. male seen today for medical management of chronic diseases.    Lives in SNF under Hospice  Patient with End Stage Dementia, Dysphagia, Recurent UTI, CKD CAD s/p Stent Placement    Full care Hoyer lift  Does not  respond  No new Nursing issues. No Behavior issues His weight is stable No Falls Wt Readings from Last 3 Encounters:  06/12/22 147 lb 12.8 oz (67 kg)  05/15/22 144 lb 6.4 oz (65.5 kg)  04/13/22 148 lb 9.6 oz (67.4 kg)   Past Medical History:  Diagnosis Date   Benign hypertensive heart disease without heart failure 04/10/2016   BPH without obstruction/lower urinary tract symptoms 04/05/2016   CKD (chronic kidney disease) stage 3, GFR 30-59 ml/min (HCC) 01/08/2019    Constipation 06/22/2017   Coronary artery disease of native artery of native heart with stable angina pectoris ([place]) 04/10/2016   Dementia ([place])    Diabetes ([place])    Dysphagia 06/12/2019   Essential tremor 09/04/2016   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   Gait abnormality 11/13/2016   Heart disease    Hyperlipidemia    Hyperlipidemia associated with type 2 diabetes mellitus ([place]) 04/05/2016   Hyperuricemia 10/31/2016   Hypotension    Partial retinal detachment of left eye 04/10/2016   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 04/10/2016   Type 2 diabetes mellitus with neurological complications ([place]) 9/37/1696   Past Surgical History:  Procedure Laterality Date   APPENDECTOMY  1961   CORONARY ANGIOPLASTY WITH STENT PLACEMENT  2016   TONSILLECTOMY AND ADENOIDECTOMY     TOTAL KNEE ARTHROPLASTY  2012    Allergies  Allergen Reactions   Lortab [Hydrocodone-Acetaminophen] Other (See Comments)    Reaction:  Affected pts cognition    Morphine And Related Other (See Comments)    Reaction:  Affected pts cognition     Outpatient Encounter Medications as of 06/12/2022  Medication Sig   acetaminophen (TYLENOL) 325 MG tablet Take 650 mg by mouth in the morning, at noon, and at bedtime. For perceived pain Am dosing to coincide with resident's sleep/wake cycle   acetaminophen (TYLENOL) 650 MG suppository Place 650 mg rectally every 4 (four) hours as needed for fever (  Up to 72 hours.).   bisacodyl (DULCOLAX) 10 MG suppository Place 10 mg rectally daily as needed (constipation).    chlorhexidine (PERIDEX) 0.12 % solution Use as directed 15 mLs in the mouth or throat 2 (two) times daily.   clotrimazole (LOTRIMIN AF) 1 % cream Apply 1 application topically 2 (two) times daily as needed.   haloperidol (HALDOL) 0.5 MG tablet Take 0.5 mg by mouth 2 (two) times daily. Agitation   LORazepam (ATIVAN) 0.5 MG tablet Take 1 tablet (0.5 mg total) by mouth every 6 (six) hours as needed.   LORazepam (ATIVAN) 0.5 MG  tablet Take 0.5 mg by mouth 2 (two) times a week. On shower days   sennosides (SENOKOT) 8.8 MG/5ML syrup Take 5 mLs by mouth 2 (two) times daily.   Sodium Fluoride (PREVIDENT DT) Place 1 Application onto teeth daily.   No facility-administered encounter medications on file as of 06/12/2022.    Review of Systems  Unable to perform ROS: Dementia    Immunization History  Administered Date(s) Administered   Fluad Quad(high Dose 65+) 04/07/2022   Influenza, High Dose Seasonal PF 04/17/2019, 04/23/2020, 04/06/2021   Influenza,inj,Quad PF,6+ Mos 04/23/2018   Influenza-Unspecified 05/05/2010, 04/11/2013, 03/03/2015, 09/06/2016, 05/10/2017, 04/27/2020   Moderna SARS-COV2 Booster Vaccination 05/13/2020, 02/24/2021   Moderna Sars-Covid-2 Vaccination 07/08/2019, 08/05/2019, 04/13/2021, 10/28/2021, 05/08/2022   Pneumococcal Polysaccharide-23 12/25/2010   Pneumococcal-Unspecified 02/28/2013   Td 06/01/2017   Tdap 03/22/2006   Zoster Recombinat (Shingrix) 05/25/2017, 09/25/2017   Zoster, Live 11/01/2012   Pertinent  Health Maintenance Due  Topic Date Due   INFLUENZA VACCINE  Completed      10/22/2020    8:00 PM 10/23/2020    8:00 AM 10/23/2020    8:00 PM 10/24/2020    9:45 AM 10/24/2020   11:00 PM  Fall Risk  Patient Fall Risk Level High fall risk High fall risk High fall risk High fall risk High fall risk   Functional Status Survey:    Vitals:   06/12/22 1024  BP: 101/64  Pulse: (!) 51  Resp: 15  Temp: 98 F (36.7 C)  TempSrc: Temporal  SpO2: 98%  Weight: 147 lb 12.8 oz (67 kg)  Height: '5\' 5"'$  (1.651 m)   Body mass index is 24.6 kg/m. Physical Exam Vitals reviewed.  Constitutional:      Comments: Does not respond  HENT:     Head: Normocephalic.     Nose: Nose normal.     Mouth/Throat:     Mouth: Mucous membranes are moist.     Pharynx: Oropharynx is clear.  Eyes:     Pupils: Pupils are equal, round, and reactive to light.  Cardiovascular:     Rate and Rhythm: Normal  rate and regular rhythm.     Pulses: Normal pulses.     Heart sounds: No murmur heard. Pulmonary:     Effort: Pulmonary effort is normal. No respiratory distress.     Breath sounds: Normal breath sounds. No rales.  Abdominal:     General: Abdomen is flat. Bowel sounds are normal.     Palpations: Abdomen is soft.  Musculoskeletal:        General: No swelling.     Cervical back: Neck supple.  Skin:    General: Skin is warm.  Neurological:     Mental Status: He is alert.  Psychiatric:        Mood and Affect: Mood normal.        Thought Content: Thought content normal.  Labs reviewed: No results for input(s): "NA", "K", "CL", "CO2", "GLUCOSE", "BUN", "CREATININE", "CALCIUM", "MG", "PHOS" in the last 8760 hours. No results for input(s): "AST", "ALT", "ALKPHOS", "BILITOT", "PROT", "ALBUMIN" in the last 8760 hours. No results for input(s): "WBC", "NEUTROABS", "HGB", "HCT", "MCV", "PLT" in the last 8760 hours. Lab Results  Component Value Date   TSH 2.45 01/09/2019   Lab Results  Component Value Date   HGBA1C 7.1 (H) 10/21/2020   Lab Results  Component Value Date   CHOL 124 10/26/2016   HDL 40 10/26/2016   LDLCALC 62 10/26/2016   TRIG 111 10/26/2016    Significant Diagnostic Results in last 30 days:  No results found.  Assessment/Plan 1. Oropharyngeal dysphagia Regular Nectar thick  2. 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 Bilateral primary osteoarthritis of knee Tylenol PRn    Family/ staff Communication:   Labs/tests ordered:

## 2022-06-21 ENCOUNTER — Other Ambulatory Visit: Payer: Self-pay | Admitting: Orthopedic Surgery

## 2022-06-21 DIAGNOSIS — F0280 Dementia in other diseases classified elsewhere without behavioral disturbance: Secondary | ICD-10-CM

## 2022-06-21 MED ORDER — LORAZEPAM 0.5 MG PO TABS: 0.5000 mg | ORAL_TABLET | Freq: Four times a day (QID) | ORAL | 2 refills | Status: DC | PRN

## 2022-06-21 MED ORDER — LORAZEPAM 0.5 MG PO TABS: 0.5000 mg | ORAL_TABLET | ORAL | 0 refills | Status: DC

## 2022-07-20 ENCOUNTER — Non-Acute Institutional Stay (SKILLED_NURSING_FACILITY): Admitting: Adult Health

## 2022-07-20 ENCOUNTER — Encounter: Payer: Self-pay | Admitting: Adult Health

## 2022-07-20 DIAGNOSIS — K1379 Other lesions of oral mucosa: Secondary | ICD-10-CM | POA: Diagnosis not present

## 2022-07-20 DIAGNOSIS — R634 Abnormal weight loss: Secondary | ICD-10-CM

## 2022-07-20 DIAGNOSIS — R1312 Dysphagia, oropharyngeal phase: Secondary | ICD-10-CM | POA: Diagnosis not present

## 2022-07-20 DIAGNOSIS — N1832 Chronic kidney disease, stage 3b: Secondary | ICD-10-CM | POA: Diagnosis not present

## 2022-07-20 DIAGNOSIS — G3109 Other frontotemporal dementia: Secondary | ICD-10-CM | POA: Diagnosis not present

## 2022-07-20 DIAGNOSIS — F0280 Dementia in other diseases classified elsewhere without behavioral disturbance: Secondary | ICD-10-CM | POA: Diagnosis not present

## 2022-07-20 DIAGNOSIS — E1149 Type 2 diabetes mellitus with other diabetic neurological complication: Secondary | ICD-10-CM | POA: Diagnosis not present

## 2022-07-20 NOTE — Progress Notes (Signed)
Location:   [facility] Room Number: 114A Place of Service:  SNF [phone]) Provider:  Royal Hawthorn, NP  [name], MD  Patient Care Team: [name], MD as PCP - General (Internal Medicine)  Extended Emergency Contact Information Primary Emergency Contact: Alley,Kelly Address: [address]          [place], [place] [address] [name] of [place] Phone: [phone] Relation: Daughter Secondary Emergency Contact: Nest,[name] Address: [address] [place] [address] [place] of [place] Phone: [phone] Relation: Son  Code Status:  DNR Goals of care: Advanced Directive information    07/20/2022   12:29 PM  Advanced Directives  Does Patient Have a Medical Advance Directive? Yes  Type of Advance Directive Out of facility DNR (pink MOST or yellow form)  Does patient want to make changes to medical advance directive? No - Patient declined  Pre-existing out of facility DNR order (yellow form or pink MOST form) Pink MOST form placed in chart (order not valid for inpatient use);Yellow form placed in chart (order not valid for inpatient use)     Chief Complaint  Patient presents with   Medical Management of Chronic Issues    Routine follow up   Immunizations    COVID booster due   Had covid vaccine 05/08/22 not due at this time.   HPI:  Pt is a 82 y.o. male seen today for medical management of chronic diseases.      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  Has DNR and most form Needs assistance with all ADLs. Minimally verbally. Eyes closed. Has resistance to personal care.   He was treated for left cheek swelling with Augmentin 07/04/22.  He was given 2 mg of ativan today for a dental visit but was not abl to tolerate exam. Now sleeping with no acute  complaints from nurse.   The swelling in the left cheek has gone down. His intake has increased. No fever.  Weight down 9 lbs in the past couple of months.  Wt Readings from Last 3 Encounters:  07/20/22 135 lb 9.6 oz (61.5 kg)  06/12/22 147 lb 12.8 oz (67 kg)  05/15/22 144 lb 6.4 oz (65.5 kg)   Currently on D 1 NTL. No reports of choking.  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   Gait abnormality 11/13/2016   Heart disease    Hyperlipidemia    Hyperlipidemia associated with type 2 diabetes mellitus ([place]) 04/05/2016   Hyperuricemia 10/31/2016   Hypotension    Partial retinal detachment of left eye 04/10/2016   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 04/10/2016   Type 2 diabetes mellitus with neurological complications ([place]) 9/32/6712   Past Surgical History:  Procedure Laterality Date   [place] WITH STENT PLACEMENT  2016  TONSILLECTOMY AND ADENOIDECTOMY     TOTAL KNEE ARTHROPLASTY  2012    Allergies  Allergen Reactions   Lortab [Hydrocodone-Acetaminophen] Other (See Comments)    Reaction:  Affected pts cognition    Morphine And Related Other (See Comments)    Reaction:  Affected pts cognition     Allergies as of 07/20/2022       Reactions   Lortab [hydrocodone-acetaminophen] Other (See Comments)   Reaction:  Affected pts cognition    Morphine And Related Other (See Comments)   Reaction:  Affected pts cognition         Medication List        Accurate as of July 20, 2022  12:37 PM. If you have any questions, ask your nurse or doctor.          acetaminophen 325 MG tablet Commonly known as: TYLENOL Take 650 mg by mouth in the morning, at noon, and at bedtime. For perceived pain Am dosing to coincide with resident's sleep/wake cycle What changed: Another medication with the same name was removed. Continue taking this medication, and follow the directions you see here. Changed by: Royal Hawthorn, NP   bisacodyl 10 MG suppository Commonly known as: DULCOLAX Place 10 mg rectally daily as needed (constipation).   chlorhexidine 0.12 % solution Commonly known as: PERIDEX Use as directed 15 mLs in the mouth or throat 2 (two) times daily.   clotrimazole 1 % cream Commonly known as: Lotrimin AF Apply 1 application topically 2 (two) times daily as needed.   haloperidol 0.5 MG tablet Commonly known as: HALDOL Take 0.5 mg by mouth 2 (two) times daily. Agitation   LORazepam 2 MG tablet Commonly known as: ATIVAN Take 2 mg by mouth once.   LORazepam 0.5 MG tablet Commonly known as: ATIVAN Take 1 tablet (0.5 mg total) by mouth every 6 (six) hours as needed.   LORazepam 0.5 MG tablet Commonly known as: ATIVAN Take 1 tablet (0.5 mg total) by mouth 2 (two) times a week. On shower days   oxyCODONE 5 MG immediate release tablet Commonly known as: Oxy IR/ROXICODONE Take 5 mg by mouth every 4 (four) hours as needed for severe pain.   PREVIDENT DT Place 1 Application onto teeth daily.   sennosides 8.8 MG/5ML syrup Commonly known as: SENOKOT Take 5 mLs by mouth 2 (two) times daily.        Review of Systems  Unable to perform ROS: Dementia    Immunization History  Administered Date(s) Administered   Fluad Quad(high Dose 65+) 04/07/2022   Influenza, High Dose Seasonal PF 04/17/2019, 04/23/2020, 04/06/2021   Influenza,inj,Quad PF,6+ Mos 04/23/2018   Influenza-Unspecified 05/05/2010, 04/11/2013, 03/03/2015, 09/06/2016, 05/10/2017, 04/27/2020   Moderna  SARS-COV2 Booster Vaccination 05/13/2020, 02/24/2021   Moderna Sars-Covid-2 Vaccination 07/08/2019, 08/05/2019, 04/13/2021, 10/28/2021, 05/08/2022   Pneumococcal Polysaccharide-23 12/25/2010   Pneumococcal-Unspecified 02/28/2013   Td 06/01/2017   Tdap 03/22/2006   Zoster Recombinat (Shingrix) 05/25/2017, 09/25/2017   Zoster, Live 11/01/2012   Pertinent  Health Maintenance Due  Topic Date Due   INFLUENZA VACCINE  Completed      10/22/2020    8:00 PM 10/23/2020    8:00 AM 10/23/2020    8:00 PM 10/24/2020    9:45 AM 10/24/2020   11:00 PM  Fall Risk  (RETIRED) Patient Fall Risk Level High fall risk High fall risk High fall risk High fall risk High fall risk   Functional Status Survey:    Vitals:   07/20/22 1225  BP: 116/63  Pulse: (!) 48  Resp: 15  Temp: (!) 97.2 F (36.2 C)  SpO2: 98%  Weight: 135 lb 9.6 oz (61.5 kg)  Height: '5\' 5"'$  (1.651 m)   Body mass index is 22.57 kg/m. Physical Exam Vitals and nursing note reviewed.  Constitutional:      General: He is not in acute distress.    Appearance: He is not diaphoretic.  HENT:     Head: Normocephalic and atraumatic.     Nose: Nose normal.     Mouth/Throat:     Mouth: Mucous membranes are moist.     Pharynx: Oropharynx is clear. No oropharyngeal exudate.  Eyes:     Conjunctiva/sclera: Conjunctivae normal.     Pupils: Pupils are equal, round, and reactive to light.  Neck:     Thyroid: No thyromegaly.     Vascular: No JVD.     Trachea: No tracheal deviation.  Cardiovascular:     Rate and Rhythm: Normal rate and regular rhythm.     Heart sounds: No murmur heard. Pulmonary:     Effort: Pulmonary effort is normal. No respiratory distress.     Breath sounds: Normal breath sounds. No wheezing.  Abdominal:     General: Bowel sounds are normal. There is no distension.     Palpations: Abdomen is soft.     Tenderness: There is no abdominal tenderness.  Musculoskeletal:     Right lower leg: No edema.     Left lower leg:  No edema.  Lymphadenopathy:     Cervical: No cervical adenopathy.  Skin:    General: Skin is warm and dry.  Neurological:     General: No focal deficit present.     Mental Status: He is alert. Mental status is at baseline.     Labs reviewed: No results for input(s): "NA", "K", "CL", "CO2", "GLUCOSE", "BUN", "CREATININE", "CALCIUM", "MG", "PHOS" in the last 8760 hours. No results for input(s): "AST", "ALT", "ALKPHOS", "BILITOT", "PROT", "ALBUMIN" in the last 8760 hours. No results for input(s): "WBC", "NEUTROABS", "HGB", "HCT", "MCV", "PLT" in the last 8760 hours. Lab Results  Component Value Date   TSH 2.45 01/09/2019   Lab Results  Component Value Date   HGBA1C 7.1 (H) 10/21/2020   Lab Results  Component Value Date   CHOL 124 10/26/2016   HDL 40 10/26/2016   LDLCALC 62 10/26/2016   TRIG 111 10/26/2016    Significant Diagnostic Results in last 30 days:  No results found.  Assessment/Plan  1. Infection of buccal space Resolved  2. 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 Weight loss Due to underlying dental infection but also progressive dementia.   4. Oropharyngeal dysphagia Continue D1 diet with NTL Asp prec No feeding tube DNR in place.   5. Type 2 diabetes mellitus with neurological complications ([place]) Diet controlled No labs due to goals of care.   6. Stage 3b chronic kidney disease ([place]) Noted See above.

## 2022-08-21 ENCOUNTER — Encounter: Payer: Self-pay | Admitting: Adult Health

## 2022-08-21 ENCOUNTER — Non-Acute Institutional Stay (SKILLED_NURSING_FACILITY): Payer: PPO | Admitting: Adult Health

## 2022-08-21 DIAGNOSIS — N400 Enlarged prostate without lower urinary tract symptoms: Secondary | ICD-10-CM | POA: Diagnosis not present

## 2022-08-21 DIAGNOSIS — N1832 Chronic kidney disease, stage 3b: Secondary | ICD-10-CM | POA: Diagnosis not present

## 2022-08-21 DIAGNOSIS — F0280 Dementia in other diseases classified elsewhere without behavioral disturbance: Secondary | ICD-10-CM

## 2022-08-21 DIAGNOSIS — E1149 Type 2 diabetes mellitus with other diabetic neurological complication: Secondary | ICD-10-CM | POA: Diagnosis not present

## 2022-08-21 DIAGNOSIS — G3109 Other frontotemporal dementia: Secondary | ICD-10-CM | POA: Diagnosis not present

## 2022-08-21 DIAGNOSIS — K5901 Slow transit constipation: Secondary | ICD-10-CM | POA: Diagnosis not present

## 2022-08-21 DIAGNOSIS — R1312 Dysphagia, oropharyngeal phase: Secondary | ICD-10-CM

## 2022-08-21 NOTE — Progress Notes (Signed)
Location:  [facility] Room Number: 114A Place of Service:  SNF [phone]) Provider:  Earney Hamburg, MD  Patient Care Team: [name], MD as PCP - General (Internal Medicine)  Extended Emergency Contact Information Primary Emergency Contact: Alley,Kelly Address: [address]          [place], [place] [address] [name] of [place] Phone: [phone] Relation: Daughter Secondary Emergency Contact: Mcquary,[name] Address: [address] [place] [address] [place] of [place] Phone: [phone] Relation: Son  Code Status:  DNR Hospice Goals of care: Advanced Directive information    08/21/2022   10:46 AM  Advanced Directives  Does Patient Have a Medical Advance Directive? Yes  Type of Advance Directive Out of facility DNR (pink MOST or yellow form)  Does patient want to make changes to medical advance directive? No - Patient declined  Pre-existing out of facility DNR order (yellow form or pink MOST form) Pink MOST form placed in chart (order not valid for inpatient use);Yellow form placed in chart (order not valid for inpatient use)     Chief Complaint  Patient presents with   Medical Management of Chronic Issues    Routine Visit     HPI:  Pt is a 82 y.o. male seen today for medical management of chronic diseases.       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  Has DNR and most form Needs assistance with all ADLs. Minimally verbally. Has resistance to personal care. Gets ativan prior to showers. Has periods of alertness and periods of lethargy.  He was treated for left cheek swelling with Augmentin 07/04/22.  Went through a period of decreased intake and weight loss which resolved. No further swelling or pain at this time.   Wt Readings  from Last 3 Encounters:  08/21/22 139 lb 9.6 oz (63.3 kg)  07/20/22 135 lb 9.6 oz (61.5 kg)  06/12/22 147 lb 12.8 oz (67 kg)   Currently on D 1 NTL. No reports of choking.   CKD: not having frequent labs due to goals of care  BPH: incontinent, not able to verbalize issues.   Constipation: LBM 2/18  DMII diet controlled   BP and HR run low chronically Past Medical History:  Diagnosis Date   Benign hypertensive heart disease without heart failure 04/10/2016   BPH without obstruction/lower urinary tract symptoms 04/05/2016   CKD (chronic kidney disease) stage 3, GFR 30-59 ml/min (HCC) 01/08/2019   Constipation 06/22/2017   Coronary artery disease of native artery of native heart with stable angina pectoris (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 04/10/2016   Type 2 diabetes mellitus with neurological complications ([place]) 0000000   Past Surgical History:  Procedure Laterality Date   APPENDECTOMY  1961   CORONARY ANGIOPLASTY WITH STENT PLACEMENT  2016   TONSILLECTOMY AND ADENOIDECTOMY  TOTAL KNEE ARTHROPLASTY  2012    Allergies  Allergen Reactions   Lortab [Hydrocodone-Acetaminophen] Other (See Comments)    Reaction:  Affected pts cognition    Morphine And Related Other (See Comments)    Reaction:  Affected pts cognition     Outpatient Encounter Medications as of 08/21/2022  Medication Sig   acetaminophen (TYLENOL) 325 MG tablet Take 650 mg by mouth in the morning, at noon, and at bedtime. For perceived pain Am dosing to coincide with resident's sleep/wake cycle   bisacodyl (DULCOLAX) 10 MG suppository  Place 10 mg rectally daily as needed (constipation).    chlorhexidine (PERIDEX) 0.12 % solution Use as directed 15 mLs in the mouth or throat 2 (two) times daily.   clotrimazole (LOTRIMIN AF) 1 % cream Apply 1 application topically 2 (two) times daily as needed.   haloperidol (HALDOL) 0.5 MG tablet Take 0.5 mg by mouth 2 (two) times daily. Agitation   LORazepam (ATIVAN) 0.5 MG tablet Take 1 tablet (0.5 mg total) by mouth every 6 (six) hours as needed.   LORazepam (ATIVAN) 0.5 MG tablet Take 1 tablet (0.5 mg total) by mouth 2 (two) times a week. On shower days   oxyCODONE (OXY IR/ROXICODONE) 5 MG immediate release tablet Take 5 mg by mouth every 4 (four) hours as needed for severe pain.   sennosides (SENOKOT) 8.8 MG/5ML syrup Take 5 mLs by mouth 2 (two) times daily.   Sodium Fluoride (PREVIDENT DT) Place 1 Application onto teeth daily.   [DISCONTINUED] LORazepam (ATIVAN) 2 MG tablet Take 2 mg by mouth once. (Patient not taking: Reported on 08/21/2022)   No facility-administered encounter medications on file as of 08/21/2022.    Review of Systems  Unable to perform ROS: Dementia    Immunization History  Administered Date(s) Administered   Fluad Quad(high Dose 65+) 04/07/2022   Influenza, High Dose Seasonal PF 04/17/2019, 04/23/2020, 04/06/2021   Influenza,inj,Quad PF,6+ Mos 04/23/2018   Influenza-Unspecified 05/05/2010, 04/11/2013, 03/03/2015, 09/06/2016, 05/10/2017, 04/27/2020   Moderna SARS-COV2 Booster Vaccination 05/13/2020, 02/24/2021   Moderna Sars-Covid-2 Vaccination 07/08/2019, 08/05/2019, 04/13/2021, 10/28/2021, 05/08/2022   Pneumococcal Polysaccharide-23 12/25/2010   Pneumococcal-Unspecified 02/28/2013   Td 06/01/2017   Tdap 03/22/2006   Zoster Recombinat (Shingrix) 05/25/2017, 09/25/2017   Zoster, Live 11/01/2012   Pertinent  Health Maintenance Due  Topic Date Due   INFLUENZA VACCINE  Completed      10/22/2020    8:00 PM 10/23/2020    8:00 AM 10/23/2020    8:00 PM  10/24/2020    9:45 AM 10/24/2020   11:00 PM  Fall Risk  (RETIRED) Patient Fall Risk Level High fall risk High fall risk High fall risk High fall risk High fall risk   Functional Status Survey:    Vitals:   08/21/22 1044  BP: (!) 91/57  Pulse: (!) 51  Resp: 19  Temp: 98.1 F (36.7 C)  TempSrc: Temporal  SpO2: 100%  Weight: 139 lb 9.6 oz (63.3 kg)  Height: 5' 5"$  (1.651 m)   Body mass index is 23.23 kg/m. Physical Exam Vitals and nursing note reviewed.  Constitutional:      General: He is not in acute distress.    Appearance: He is not diaphoretic.     Comments: Asleep   HENT:     Head: Normocephalic and atraumatic.     Nose: Nose normal.     Mouth/Throat:     Mouth: Mucous membranes are moist.     Pharynx: Oropharynx is clear.  Eyes:  Comments: Would not open eyes  Neck:     Thyroid: No thyromegaly.     Vascular: No JVD.     Trachea: No tracheal deviation.  Cardiovascular:     Rate and Rhythm: Normal rate and regular rhythm.     Heart sounds: No murmur heard. Pulmonary:     Effort: Pulmonary effort is normal. No respiratory distress.     Breath sounds: Normal breath sounds. No wheezing.  Abdominal:     General: Bowel sounds are normal. There is no distension.     Palpations: Abdomen is soft.     Tenderness: There is no abdominal tenderness.  Musculoskeletal:     Right lower leg: No edema.     Left lower leg: No edema.  Lymphadenopathy:     Cervical: No cervical adenopathy.  Skin:    General: Skin is warm and dry.  Neurological:     General: No focal deficit present.     Mental Status: Mental status is at baseline.  Psychiatric:     Comments: Asleep not easy to arouse     Labs reviewed: No results for input(s): "NA", "K", "CL", "CO2", "GLUCOSE", "BUN", "CREATININE", "CALCIUM", "MG", "PHOS" in the last 8760 hours. No results for input(s): "AST", "ALT", "ALKPHOS", "BILITOT", "PROT", "ALBUMIN" in the last 8760 hours. No results for input(s): "WBC",  "NEUTROABS", "HGB", "HCT", "MCV", "PLT" in the last 8760 hours. Lab Results  Component Value Date   TSH 2.45 01/09/2019   Lab Results  Component Value Date   HGBA1C 7.1 (H) 10/21/2020   Lab Results  Component Value Date   CHOL 124 10/26/2016   HDL 40 10/26/2016   LDLCALC 62 10/26/2016   TRIG 111 10/26/2016    Significant Diagnostic Results in last 30 days:  No results found.  Assessment/Plan   1. 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 Oropharyngeal dysphagia Continue D1 diet with NTL Asp prec No feeding tube DNR in place.   3. Type 2 diabetes mellitus with neurological complications ([place]) Diet controlled No labs due to goals of care.   4. Stage 3b chronic kidney disease ([place]) Noted See above.   5. BPH No current issues Not currently on meds  6. Constipation Having regular BMs Continue senokot.     Family/ staff Communication: nurse  Labs/tests ordered:  NA

## 2022-09-04 ENCOUNTER — Non-Acute Institutional Stay (SKILLED_NURSING_FACILITY): Admitting: Internal Medicine

## 2022-09-04 ENCOUNTER — Encounter: Payer: Self-pay | Admitting: Internal Medicine

## 2022-09-04 DIAGNOSIS — G3109 Other frontotemporal dementia: Secondary | ICD-10-CM | POA: Diagnosis not present

## 2022-09-04 DIAGNOSIS — E1149 Type 2 diabetes mellitus with other diabetic neurological complication: Secondary | ICD-10-CM

## 2022-09-04 DIAGNOSIS — F0280 Dementia in other diseases classified elsewhere without behavioral disturbance: Secondary | ICD-10-CM | POA: Diagnosis not present

## 2022-09-04 DIAGNOSIS — R1312 Dysphagia, oropharyngeal phase: Secondary | ICD-10-CM

## 2022-09-04 DIAGNOSIS — N1832 Chronic kidney disease, stage 3b: Secondary | ICD-10-CM | POA: Diagnosis not present

## 2022-09-04 DIAGNOSIS — N400 Enlarged prostate without lower urinary tract symptoms: Secondary | ICD-10-CM

## 2022-09-04 DIAGNOSIS — K5901 Slow transit constipation: Secondary | ICD-10-CM | POA: Diagnosis not present

## 2022-09-04 NOTE — Progress Notes (Signed)
Location:  [facility] Room Number: 114A Place of Service:  SNF [phone]) Provider:  [name], MD   [name], MD  Patient Care Team: [name], MD as PCP - General (Internal Medicine)  Extended Emergency Contact Information Primary Emergency Contact: Alley,Kelly Address: [address]          [place], [place] [address] [name] of [place] Phone: [phone] Relation: Daughter Secondary Emergency Contact: Howden,[name] Address: [address] [place] [address] [place] of [place] Phone: [phone] Relation: Son  Code Status:  DNR hospice  hospice Goals of care: Advanced Directive information    09/04/2022    3:35 PM  Advanced Directives  Does Patient Have a Medical Advance Directive? Yes  Type of Advance Directive Out of facility DNR (pink MOST or yellow form);[place];Living will  Does patient want to make changes to medical advance directive? No - Patient declined  Copy of [place] in Chart? No - copy requested  Pre-existing out of facility DNR order (yellow form or pink MOST form) Pink MOST form placed in chart (order not valid for inpatient use);Yellow form placed in chart (order not valid for inpatient use)     Chief Complaint  Patient presents with   Medical Management of Chronic Issues    Patient is being seen for a routine visit     HPI:  Pt is a 82 y.o. male seen today for medical management of chronic diseases.    Lives in SNF under Hospice   Patient with End Stage Dementia, Dysphagia, Recurent UTI, CKD CAD s/p Stent Placement  Full care Hoyer Lift Does not respond No behaviors Has to be fed Has lost weight  No Falls Wt Readings from Last 3 Encounters:  09/04/22 139 lb 9.6 oz (63.3 kg)  08/21/22 139 lb 9.6 oz (63.3 kg)  07/20/22 135 lb 9.6 oz (61.5 kg)    Past Medical History:  Diagnosis Date   Benign hypertensive heart  disease without heart failure 04/10/2016   BPH without obstruction/lower urinary tract symptoms 04/05/2016   CKD (chronic kidney disease) stage 3, GFR 30-59 ml/min (HCC) 01/08/2019   Constipation 06/22/2017   Coronary artery disease of native artery of native heart with stable angina pectoris ([place]) 04/10/2016   Dementia ([place])    Diabetes ([place])    Dysphagia 06/12/2019   Essential tremor 09/04/2016   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   Gait abnormality 11/13/2016   Heart disease    Hyperlipidemia    Hyperlipidemia associated with type 2 diabetes mellitus ([place]) 04/05/2016   Hyperuricemia 10/31/2016   Hypotension    Partial retinal detachment of left eye 04/10/2016   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 04/10/2016   Type 2 diabetes mellitus with neurological complications ([place]) 0000000   Past Surgical History:  Procedure Laterality Date   APPENDECTOMY  1961   CORONARY ANGIOPLASTY WITH STENT PLACEMENT  2016   TONSILLECTOMY AND ADENOIDECTOMY     TOTAL KNEE ARTHROPLASTY  2012    Allergies  Allergen Reactions   Lortab [Hydrocodone-Acetaminophen] Other (See Comments)    Reaction:  Affected pts cognition    Morphine And Related Other (See Comments)    Reaction:  Affected pts cognition     Outpatient Encounter Medications as of 09/04/2022  Medication Sig   acetaminophen (TYLENOL) 325 MG tablet Take 650 mg by mouth in the morning, at noon,  and at bedtime. For perceived pain Am dosing to coincide with resident's sleep/wake cycle   bisacodyl (DULCOLAX) 10 MG suppository Place 10 mg rectally daily as needed (constipation).    chlorhexidine (PERIDEX) 0.12 % solution Use as directed 15 mLs in the mouth or throat 2 (two) times daily.   clotrimazole (LOTRIMIN AF) 1 % cream Apply 1 application topically 2 (two) times daily as needed.   haloperidol (HALDOL) 0.5 MG tablet Take 0.5 mg by mouth 2 (two) times daily. Agitation   LORazepam (ATIVAN) 0.5 MG tablet Take 1 tablet (0.5 mg total) by mouth every 6  (six) hours as needed.   LORazepam (ATIVAN) 0.5 MG tablet Take 1 tablet (0.5 mg total) by mouth 2 (two) times a week. On shower days   oxyCODONE (OXY IR/ROXICODONE) 5 MG immediate release tablet Take 5 mg by mouth every 4 (four) hours as needed for severe pain.   sennosides (SENOKOT) 8.8 MG/5ML syrup Take 5 mLs by mouth 2 (two) times daily.   Sodium Fluoride (PREVIDENT DT) Place 1 Application onto teeth daily.   No facility-administered encounter medications on file as of 09/04/2022.    Review of Systems  Unable to perform ROS: Dementia    Immunization History  Administered Date(s) Administered   Fluad Quad(high Dose 65+) 04/07/2022   Influenza, High Dose Seasonal PF 04/17/2019, 04/23/2020, 04/06/2021   Influenza,inj,Quad PF,6+ Mos 04/23/2018   Influenza-Unspecified 05/05/2010, 04/11/2013, 03/03/2015, 09/06/2016, 05/10/2017, 04/27/2020   Moderna SARS-COV2 Booster Vaccination 05/13/2020, 02/24/2021   Moderna Sars-Covid-2 Vaccination 07/08/2019, 08/05/2019, 04/13/2021, 10/28/2021, 05/08/2022   Pneumococcal Polysaccharide-23 12/25/2010   Pneumococcal-Unspecified 02/28/2013   Td 06/01/2017   Tdap 03/22/2006   Zoster Recombinat (Shingrix) 05/25/2017, 09/25/2017   Zoster, Live 11/01/2012   Pertinent  Health Maintenance Due  Topic Date Due   INFLUENZA VACCINE  Completed      10/22/2020    8:00 PM 10/23/2020    8:00 AM 10/23/2020    8:00 PM 10/24/2020    9:45 AM 10/24/2020   11:00 PM  Fall Risk  (RETIRED) Patient Fall Risk Level High fall risk High fall risk High fall risk High fall risk High fall risk   Functional Status Survey:    Vitals:   09/04/22 1532  BP: 91/62  Pulse: (!) 45  Resp: 15  Temp: 97.9 F (36.6 C)  TempSrc: Temporal  SpO2: 99%  Weight: 139 lb 9.6 oz (63.3 kg)  Height: '5\' 5"'$  (1.651 m)   Body mass index is 23.23 kg/m. Physical Exam Vitals reviewed.  Constitutional:      Comments: Does not respond Has Contractures in his extremities  HENT:     Head:  Normocephalic.     Nose: Nose normal.     Mouth/Throat:     Mouth: Mucous membranes are moist.     Pharynx: Oropharynx is clear.  Eyes:     Pupils: Pupils are equal, round, and reactive to light.  Cardiovascular:     Rate and Rhythm: Normal rate and regular rhythm.     Pulses: Normal pulses.     Heart sounds: No murmur heard. Pulmonary:     Effort: Pulmonary effort is normal. No respiratory distress.     Breath sounds: Normal breath sounds. No rales.  Abdominal:     General: Abdomen is flat. Bowel sounds are normal.     Palpations: Abdomen is soft.  Musculoskeletal:        General: No swelling.     Cervical back: Neck supple.  Skin:  General: Skin is warm.  Neurological:     Mental Status: He is alert.     Comments: Aphasic  Psychiatric:        Thought Content: Thought content normal.     Labs reviewed: No results for input(s): "NA", "K", "CL", "CO2", "GLUCOSE", "BUN", "CREATININE", "CALCIUM", "MG", "PHOS" in the last 8760 hours. No results for input(s): "AST", "ALT", "ALKPHOS", "BILITOT", "PROT", "ALBUMIN" in the last 8760 hours. No results for input(s): "WBC", "NEUTROABS", "HGB", "HCT", "MCV", "PLT" in the last 8760 hours. Lab Results  Component Value Date   TSH 2.45 01/09/2019   Lab Results  Component Value Date   HGBA1C 7.1 (H) 10/21/2020   Lab Results  Component Value Date   CHOL 124 10/26/2016   HDL 40 10/26/2016   LDLCALC 62 10/26/2016   TRIG 111 10/26/2016    Significant Diagnostic Results in last 30 days:  No results found.  Assessment/Plan 1. 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 Oropharyngeal dysphagia D1 With Nectar Thick Is Losing weight  3. Stage 3b chronic kidney disease ([place]) No Labs due to goals of care  4. Benign prostatic hyperplasia without lower urinary tract symptoms No meds anymore  5. Slow transit constipation [place]  6. Type 2 diabetes mellitus  with neurological complications ([place]) No Labs due to hospice and Goals of care   Family/ staff Communication:   Labs/tests ordered:

## 2022-10-16 ENCOUNTER — Encounter: Payer: Self-pay | Admitting: Adult Health

## 2022-10-16 ENCOUNTER — Non-Acute Institutional Stay (SKILLED_NURSING_FACILITY): Admitting: Adult Health

## 2022-10-16 DIAGNOSIS — F0280 Dementia in other diseases classified elsewhere without behavioral disturbance: Secondary | ICD-10-CM | POA: Diagnosis not present

## 2022-10-16 DIAGNOSIS — E790 Hyperuricemia without signs of inflammatory arthritis and tophaceous disease: Secondary | ICD-10-CM | POA: Diagnosis not present

## 2022-10-16 DIAGNOSIS — N1832 Chronic kidney disease, stage 3b: Secondary | ICD-10-CM

## 2022-10-16 DIAGNOSIS — R1312 Dysphagia, oropharyngeal phase: Secondary | ICD-10-CM

## 2022-10-16 DIAGNOSIS — G3109 Other frontotemporal dementia: Secondary | ICD-10-CM | POA: Diagnosis not present

## 2022-10-16 DIAGNOSIS — K5901 Slow transit constipation: Secondary | ICD-10-CM

## 2022-10-16 DIAGNOSIS — E1149 Type 2 diabetes mellitus with other diabetic neurological complication: Secondary | ICD-10-CM

## 2022-10-16 NOTE — Progress Notes (Signed)
Location:  [facility] Room Number: 114A Place of Service:  SNF [phone]) Provider:  Tamsen Roers, MD  Patient Care Team: Mahlon Gammon, MD as PCP - General (Internal Medicine)  Extended Emergency Contact Information Primary Emergency Contact: Alley,Kelly Address: [address]          [place], [place] [address] [place] of [place] Home Phone: [phone] Relation: Daughter Secondary Emergency Contact: Mendell,[name] Address: [address]          [place], [place] [address] [place] of [place] Home Phone: [phone] Relation: Son  Code Status:  DNR Hospice Goals of care: Advanced Directive information    10/16/2022    9:56 AM  Advanced Directives  Does Patient Have a Medical Advance Directive? Yes  Type of Advance Directive Out of facility DNR (pink MOST or yellow form);Healthcare Power of [place];Living will  Does patient want to make changes to medical advance directive? No - Patient declined  Copy of Healthcare Power of Attorney in Chart? No - copy requested  Pre-existing out of facility DNR order (yellow form or pink MOST form) Pink MOST form placed in chart (order not valid for inpatient use);Yellow form placed in chart (order not valid for inpatient use)     Chief Complaint  Patient presents with   Medical Management of Chronic Issues    Patient being seen for a routine visit     HPI:  Pt is a 82 y.o. male seen today for medical management of chronic diseases.    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  Has DNR and most form Needs assistance with all ADLs. Minimally verbally. Resistance to personal care, has ativan each day and prn Recently had dose reduction with haldol, no increase in behaviors. Oxycodone ordered for any perceived pain no currently  using.   Currently on D 1 NTL. No reports of choking.   CKD: not having frequent labs due to goals of care  BPH: incontinent, not able to verbalize issues.   Constipation: LBM 4/12  DMII diet controlled not monitoring labs due to goals of care.   BP and HR run low chronically  CAD off plavix  Hyperuricemia off allopurinol no issues.  Past Medical History:  Diagnosis Date   Benign hypertensive heart disease without heart failure 04/10/2016   BPH without obstruction/lower urinary tract symptoms 04/05/2016   CKD (chronic kidney disease) stage 3, GFR 30-59 ml/min 01/08/2019   Constipation 06/22/2017   Coronary artery disease of native artery of native heart with stable angina pectoris 04/10/2016   Dementia    Diabetes    Dysphagia 06/12/2019   Essential tremor 09/04/2016   Frontal dementia 09/04/2016   Gait abnormality 11/13/2016   Heart disease    Hyperlipidemia    Hyperlipidemia associated with type 2 diabetes mellitus 04/05/2016   Hyperuricemia 10/31/2016   Hypotension    Partial retinal detachment of left eye 04/10/2016   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 04/10/2016   Type 2 diabetes mellitus with neurological complications 07/27/2016   Past Surgical History:  Procedure Laterality Date   APPENDECTOMY  1961   CORONARY ANGIOPLASTY WITH STENT PLACEMENT  2016   TONSILLECTOMY AND ADENOIDECTOMY     TOTAL KNEE ARTHROPLASTY  2012    Allergies  Allergen Reactions   Lortab [Hydrocodone-Acetaminophen] Other (See Comments)  Reaction:  Affected pts cognition    Morphine And Related Other (See Comments)    Reaction:  Affected pts cognition     Outpatient Encounter Medications as of 10/16/2022  Medication Sig   acetaminophen (TYLENOL) 325 MG tablet Take 650 mg by mouth in the morning, at noon, and at bedtime. For perceived pain Am dosing to coincide with resident's sleep/wake cycle   bisacodyl (DULCOLAX) 10 MG suppository Place 10 mg rectally daily as needed (constipation).     chlorhexidine (PERIDEX) 0.12 % solution Use as directed 15 mLs in the mouth or throat 2 (two) times daily.   clotrimazole (LOTRIMIN AF) 1 % cream Apply 1 application topically 2 (two) times daily as needed.   haloperidol (HALDOL) 0.5 MG tablet Take 0.5 mg by mouth in the morning. Agitation   LORazepam (ATIVAN) 0.5 MG tablet Take 1 tablet (0.5 mg total) by mouth every 6 (six) hours as needed.   LORazepam (ATIVAN) 0.5 MG tablet Take 1 tablet (0.5 mg total) by mouth 2 (two) times a week. On shower days   oxyCODONE (OXY IR/ROXICODONE) 5 MG immediate release tablet Take 5 mg by mouth every 4 (four) hours as needed for severe pain.   sennosides (SENOKOT) 8.8 MG/5ML syrup Take 5 mLs by mouth 2 (two) times daily.   Sodium Fluoride (PREVIDENT DT) Place 1 Application onto teeth daily.   No facility-administered encounter medications on file as of 10/16/2022.    Review of Systems  Unable to perform ROS: Dementia    Immunization History  Administered Date(s) Administered   Fluad Quad(high Dose 65+) 04/07/2022   Influenza, High Dose Seasonal PF 04/17/2019, 04/23/2020, 04/06/2021   Influenza,inj,Quad PF,6+ Mos 04/23/2018   Influenza-Unspecified 05/05/2010, 04/11/2013, 03/03/2015, 09/06/2016, 05/10/2017, 04/27/2020   Moderna SARS-COV2 Booster Vaccination 05/13/2020, 02/24/2021   Moderna Sars-Covid-2 Vaccination 07/08/2019, 08/05/2019, 04/13/2021, 10/28/2021, 05/08/2022   Pneumococcal Polysaccharide-23 12/25/2010   Pneumococcal-Unspecified 02/28/2013   Td 06/01/2017   Tdap 03/22/2006   Zoster Recombinat (Shingrix) 05/25/2017, 09/25/2017   Zoster, Live 11/01/2012   Pertinent  Health Maintenance Due  Topic Date Due   INFLUENZA VACCINE  02/01/2023      10/22/2020    8:00 PM 10/23/2020    8:00 AM 10/23/2020    8:00 PM 10/24/2020    9:45 AM 10/24/2020   11:00 PM  Fall Risk  (RETIRED) Patient Fall Risk Level High fall risk High fall risk High fall risk High fall risk High fall risk   Functional  Status Survey:    Vitals:   10/16/22 0952  BP: 94/63  Pulse: (!) 56  Resp: 15  Temp: (!) 97.3 F (36.3 C)  TempSrc: Temporal  SpO2: 100%  Weight: 141 lb 3.2 oz (64 kg)  Height:  (1.651 m)   Body mass index is 23.5 kg/m. Physical Exam Vitals and nursing note reviewed.  Constitutional:      General: He is not in acute distress.    Appearance: He is not diaphoretic.     Comments: Sleeping, refuses to open eyes  HENT:     Head: Normocephalic and atraumatic.     Right Ear: Tympanic membrane normal.     Left Ear: Tympanic membrane normal.     Nose: Nose normal.     Mouth/Throat:     Mouth: Mucous membranes are moist.     Pharynx: Oropharynx is clear.  Neck:     Thyroid: No thyromegaly.     Vascular: No JVD.     Trachea: No tracheal deviation.  Cardiovascular:     Rate and Rhythm: Regular rhythm. Bradycardia present.     Heart sounds: No murmur heard. Pulmonary:     Effort: Pulmonary effort is normal. No respiratory distress.     Breath sounds: Normal breath sounds. No wheezing.  Abdominal:     General: Bowel sounds are normal. There is no distension.     Palpations: Abdomen is soft.     Tenderness: There is no abdominal tenderness.  Musculoskeletal:     Cervical back: Neck supple.     Right lower leg: No edema.     Left lower leg: No edema.  Lymphadenopathy:     Cervical: No cervical adenopathy.  Skin:    General: Skin is warm and dry.  Neurological:     Comments: Asleep not f/c at baseline Minimally verbal     Labs reviewed: No results for input(s): "NA", "K", "CL", "CO2", "GLUCOSE", "BUN", "CREATININE", "CALCIUM", "MG", "PHOS" in the last 8760 hours. No results for input(s): "AST", "ALT", "ALKPHOS", "BILITOT", "PROT", "ALBUMIN" in the last 8760 hours. No results for input(s): "WBC", "NEUTROABS", "HGB", "HCT", "MCV", "PLT" in the last 8760 hours. Lab Results  Component Value Date   TSH 2.45 01/09/2019   Lab Results  Component Value Date   HGBA1C  7.1 (H) 10/21/2020   Lab Results  Component Value Date   CHOL 124 10/26/2016   HDL 40 10/26/2016   LDLCALC 62 10/26/2016   TRIG 111 10/26/2016    Significant Diagnostic Results in last 30 days:  No results found.  Assessment/Plan  1. Oropharyngeal dysphagia Continue D 1 diet with NTL Asp prec  2. Frontal dementia Severe stage Followed by hospice DNR in place Avoid hospitalizations  Continue haldol and ativan   3. Stage 3b chronic kidney disease Not monitoring labs.   4. Type 2 diabetes mellitus with neurological complications Diet controlled Not on meds per goals of care  5. Hyperuricemia No new issues Off allopurinol  6. Slow transit constipation Continue senokot   Family/ staff Communication: nurse  Labs/tests ordered:  NA

## 2022-10-31 DIAGNOSIS — M79672 Pain in left foot: Secondary | ICD-10-CM | POA: Diagnosis not present

## 2022-10-31 DIAGNOSIS — E119 Type 2 diabetes mellitus without complications: Secondary | ICD-10-CM | POA: Diagnosis not present

## 2022-10-31 DIAGNOSIS — M79671 Pain in right foot: Secondary | ICD-10-CM | POA: Diagnosis not present

## 2022-10-31 DIAGNOSIS — B351 Tinea unguium: Secondary | ICD-10-CM | POA: Diagnosis not present

## 2022-11-16 ENCOUNTER — Non-Acute Institutional Stay (SKILLED_NURSING_FACILITY): Admitting: Adult Health

## 2022-11-16 DIAGNOSIS — E790 Hyperuricemia without signs of inflammatory arthritis and tophaceous disease: Secondary | ICD-10-CM | POA: Diagnosis not present

## 2022-11-16 DIAGNOSIS — G3109 Other frontotemporal dementia: Secondary | ICD-10-CM

## 2022-11-16 DIAGNOSIS — N1832 Chronic kidney disease, stage 3b: Secondary | ICD-10-CM | POA: Diagnosis not present

## 2022-11-16 DIAGNOSIS — K5901 Slow transit constipation: Secondary | ICD-10-CM | POA: Diagnosis not present

## 2022-11-16 DIAGNOSIS — F0280 Dementia in other diseases classified elsewhere without behavioral disturbance: Secondary | ICD-10-CM

## 2022-11-16 DIAGNOSIS — R1312 Dysphagia, oropharyngeal phase: Secondary | ICD-10-CM | POA: Diagnosis not present

## 2022-11-16 DIAGNOSIS — E1149 Type 2 diabetes mellitus with other diabetic neurological complication: Secondary | ICD-10-CM | POA: Diagnosis not present

## 2022-11-17 ENCOUNTER — Encounter: Payer: Self-pay | Admitting: Adult Health

## 2022-11-17 NOTE — Progress Notes (Addendum)
Location:  [facility] Service:  SNF (31) Provider:  Tamsen Roers, MD  Patient Care Team: Mahlon Gammon, MD as PCP - General (Internal Medicine)  Extended Emergency Contact Information Primary Emergency Contact: Alley,Kelly Address: [address]          [place], [place] [address] [place] Home Phone: [phone] Relation: Daughter Secondary Emergency Contact: Hagger,[name] Address: [address]          [place], [place] [address] [place] Home Phone: [phone] Relation: Son  Code Status:  DNR Hospice Goals of care: Advanced Directive information    10/16/2022    9:56 AM  Advanced Directives  Does Patient Have a Medical Advance Directive? Yes  Type of Advance Directive Out of facility DNR (pink MOST or yellow form);Healthcare Power of [place];Living will  Does patient want to make changes to medical advance directive? No - Patient declined  Copy of Healthcare Power of Attorney in Chart? No - copy requested  Pre-existing out of facility DNR order (yellow form or pink MOST form) Pink MOST form placed in chart (order not valid for inpatient use);Yellow form placed in chart (order not valid for inpatient use)     Chief Complaint  Patient presents with   Medical Management of Chronic Issues    HPI:  Pt is a 82 y.o. male seen today for medical management of chronic diseases.    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  Has DNR and most form Needs assistance with all ADLs. Minimally verbally. Resistance to personal care, has ativan each day and also haldol  He was diagnosed on 5/7 with Flu A and B and placed on isolation for 7 days. He had a vomiting episode during that time and a fever of 102. No significant resp concerns. The nurse feels he  is declining now and eating less. He has a pressure injury to his coccyx as well. Dilaudid is ordered for pain due to allergy to morphine but he has not needed it.   Currently on D 1 NTL. He is eating less, sleeping more Wt Readings from Last 3 Encounters:  11/17/22 139 lb 9.6 oz (63.3 kg)  10/16/22 141 lb 3.2 oz (64 kg)  09/04/22 139 lb 9.6 oz (63.3 kg)     CKD: not having frequent labs due to goals of care  BPH: incontinent, not able to verbalize issues.   Constipation: on senokot, periodically needs prn med.   DMII diet controlled not monitoring labs due to goals of care.   BP and HR run low chronically  CAD off plavix  Hyperuricemia off allopurinol no issues.  Past Medical History:  Diagnosis Date   Benign hypertensive heart disease without heart failure 04/10/2016   BPH without obstruction/lower urinary tract symptoms 04/05/2016   CKD (chronic kidney disease) stage 3, GFR 30-59 ml/min (HCC) 01/08/2019   Constipation 06/22/2017   Coronary artery disease of native artery of native heart with stable angina pectoris (HCC) 04/10/2016   Dementia (HCC)    Diabetes (HCC)    Dysphagia 06/12/2019   Essential tremor 09/04/2016   Frontal dementia (HCC) 09/04/2016   Gait abnormality 11/13/2016   Heart disease    Hyperlipidemia    Hyperlipidemia associated with type 2 diabetes mellitus (HCC) 04/05/2016   Hyperuricemia 10/31/2016  Hypotension    Partial retinal detachment of left eye 04/10/2016   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 04/10/2016   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 For perceived pain Am dosing to coincide with resident's sleep/wake cycle   bisacodyl (DULCOLAX) 10 MG suppository Place 10 mg rectally daily as needed (constipation).    chlorhexidine (PERIDEX) 0.12 % solution Use as directed 15 mLs in the mouth or throat 2 (two) times daily.   clotrimazole (LOTRIMIN AF) 1 % cream Apply 1 application topically 2 (two) times daily as needed.   haloperidol (HALDOL) 0.5 MG tablet Take 0.5 mg by mouth in the morning. Agitation   LORazepam (ATIVAN) 0.5 MG tablet Take 1 tablet (0.5 mg total) by mouth every 6 (six) hours as needed.   LORazepam (ATIVAN) 0.5 MG tablet Take 1 tablet (0.5 mg total) by mouth 2 (two) times a week. On shower days   sennosides (SENOKOT) 8.8 MG/5ML syrup Take 5 mLs by mouth 2 (two) times daily.   Sodium Fluoride (PREVIDENT DT) Place 1 Application onto teeth daily.   [DISCONTINUED] oxyCODONE (OXY IR/ROXICODONE) 5 MG immediate release tablet Take 5 mg by mouth every 4 (four) hours as needed for severe pain.   No facility-administered encounter medications on file as of 11/16/2022.    Review of Systems  Unable to perform ROS: Dementia    Immunization History  Administered Date(s) Administered   Fluad Quad(high Dose 65+) 04/07/2022   Influenza, High Dose Seasonal PF 04/17/2019, 04/23/2020, 04/06/2021   Influenza,inj,Quad PF,6+ Mos 04/23/2018   Influenza-Unspecified 05/05/2010, 04/11/2013, 03/03/2015, 09/06/2016, 05/10/2017, 04/27/2020   Moderna SARS-COV2 Booster Vaccination 05/13/2020, 02/24/2021   Moderna Sars-Covid-2 Vaccination 07/08/2019, 08/05/2019, 04/13/2021, 10/28/2021, 05/08/2022   Pneumococcal Polysaccharide-23 12/25/2010   Pneumococcal-Unspecified 02/28/2013   Td 06/01/2017   Tdap 03/22/2006   Zoster Recombinat (Shingrix)  05/25/2017, 09/25/2017   Zoster, Live 11/01/2012   Pertinent  Health Maintenance Due  Topic Date Due   INFLUENZA VACCINE  02/01/2023      10/22/2020    8:00 PM 10/23/2020    8:00 AM 10/23/2020    8:00 PM 10/24/2020    9:45 AM 10/24/2020   11:00 PM  Fall Risk  (RETIRED) Patient Fall Risk Level High fall risk High fall risk High fall risk High fall risk High fall risk   Functional Status Survey:    Vitals:   11/17/22 0850  BP: 106/70  Pulse: 73  Resp: 16  Temp: 97.7 F (36.5 C)  SpO2: 96%  Weight: 139 lb 9.6 oz (63.3 kg)   Body mass index is 23.23 kg/m. Physical Exam Vitals and nursing note reviewed.  Constitutional:      General: He is not in acute distress.    Appearance: He is not diaphoretic.     Comments: Sleeping, refuses to open eyes  HENT:  Head: Normocephalic and atraumatic.     Right Ear: Tympanic membrane normal.     Left Ear: Tympanic membrane normal.     Nose: Nose normal.     Mouth/Throat:     Mouth: Mucous membranes are moist.     Pharynx: Oropharynx is clear.  Neck:     Thyroid: No thyromegaly.     Vascular: No JVD.     Trachea: No tracheal deviation.  Cardiovascular:     Rate and Rhythm: Regular rhythm. Bradycardia present.     Heart sounds: No murmur heard. Pulmonary:     Effort: Pulmonary effort is normal. No respiratory distress.     Breath sounds: Normal breath sounds. No wheezing.  Abdominal:     General: Bowel sounds are normal. There is no distension.     Palpations: Abdomen is soft.     Tenderness: There is no abdominal tenderness.  Musculoskeletal:     Cervical back: Neck supple.     Right lower leg: No edema.     Left lower leg: No edema.  Lymphadenopathy:     Cervical: No cervical adenopathy.  Skin:    General: Skin is warm and dry.  Neurological:     Comments: Asleep not f/c at baseline Minimally verbal     Labs reviewed: No results for input(s): "NA", "K", "CL", "CO2", "GLUCOSE", "BUN", "CREATININE", "CALCIUM", "MG",  "PHOS" in the last 8760 hours. No results for input(s): "AST", "ALT", "ALKPHOS", "BILITOT", "PROT", "ALBUMIN" in the last 8760 hours. No results for input(s): "WBC", "NEUTROABS", "HGB", "HCT", "MCV", "PLT" in the last 8760 hours. Lab Results  Component Value Date   TSH 2.45 01/09/2019   Lab Results  Component Value Date   HGBA1C 7.1 (H) 10/21/2020   Lab Results  Component Value Date   CHOL 124 10/26/2016   HDL 40 10/26/2016   LDLCALC 62 10/26/2016   TRIG 111 10/26/2016    Significant Diagnostic Results in last 30 days:  No results found.  Assessment/Plan  1. Oropharyngeal dysphagia Continue D 1 diet with NTL Reduced intake.  Asp prec  2. Frontal dementia Severe stage Followed by hospice DNR in place Avoid hospitalizations  He appears to be progressing since the episode of the flu.   3. Stage 3b chronic kidney disease Not monitoring labs.   4. Type 2 diabetes mellitus with neurological complications Diet controlled Not on meds per goals of care  5. Hyperuricemia No new issues Off allopurinol  6. Slow transit constipation Continue senokot   Family/ staff Communication: nurse  Labs/tests ordered:  NA

## 2022-11-17 NOTE — Addendum Note (Signed)
Addended by: Fletcher Anon L on: 11/17/2022 09:00 AM   Modules accepted: Orders

## 2022-12-02 DEATH — deceased
